# Patient Record
Sex: Male | Born: 1950 | Race: White | Hispanic: No | Marital: Married | Smoking: Former smoker
Health system: Southern US, Community
[De-identification: ages and names within clinical notes are randomized; demographics above are authoritative.]

## PROBLEM LIST (undated history)

## (undated) DIAGNOSIS — K828 Other specified diseases of gallbladder: Secondary | ICD-10-CM

## (undated) DIAGNOSIS — K222 Esophageal obstruction: Secondary | ICD-10-CM

## (undated) DIAGNOSIS — E7800 Pure hypercholesterolemia, unspecified: Secondary | ICD-10-CM

## (undated) DIAGNOSIS — N200 Calculus of kidney: Secondary | ICD-10-CM

## (undated) DIAGNOSIS — D126 Benign neoplasm of colon, unspecified: Secondary | ICD-10-CM

## (undated) DIAGNOSIS — I499 Cardiac arrhythmia, unspecified: Secondary | ICD-10-CM

## (undated) DIAGNOSIS — K649 Unspecified hemorrhoids: Secondary | ICD-10-CM

## (undated) DIAGNOSIS — I4891 Unspecified atrial fibrillation: Secondary | ICD-10-CM

## (undated) DIAGNOSIS — K449 Diaphragmatic hernia without obstruction or gangrene: Secondary | ICD-10-CM

## (undated) DIAGNOSIS — K8020 Calculus of gallbladder without cholecystitis without obstruction: Secondary | ICD-10-CM

## (undated) DIAGNOSIS — Z87442 Personal history of urinary calculi: Secondary | ICD-10-CM

## (undated) DIAGNOSIS — K219 Gastro-esophageal reflux disease without esophagitis: Secondary | ICD-10-CM

## (undated) DIAGNOSIS — I10 Essential (primary) hypertension: Secondary | ICD-10-CM

## (undated) DIAGNOSIS — Z860100 Personal history of colon polyps, unspecified: Secondary | ICD-10-CM

## (undated) DIAGNOSIS — M1990 Unspecified osteoarthritis, unspecified site: Secondary | ICD-10-CM

## (undated) DIAGNOSIS — M109 Gout, unspecified: Secondary | ICD-10-CM

## (undated) HISTORY — DX: Unspecified atrial fibrillation: I48.91

## (undated) HISTORY — DX: Other specified diseases of gallbladder: K82.8

## (undated) HISTORY — PX: COLONOSCOPY: SHX174

## (undated) HISTORY — DX: Unspecified hemorrhoids: K64.9

## (undated) HISTORY — PX: ESOPHAGEAL DILATION: SHX303

## (undated) HISTORY — DX: Benign neoplasm of colon, unspecified: D12.6

## (undated) HISTORY — DX: Personal history of colonic polyps: Z86.010

## (undated) HISTORY — DX: Esophageal obstruction: K22.2

## (undated) HISTORY — DX: Calculus of kidney: N20.0

## (undated) HISTORY — PX: POLYPECTOMY: SHX149

## (undated) HISTORY — PX: VASECTOMY: SHX75

## (undated) HISTORY — PX: INGUINAL HERNIA REPAIR: SUR1180

## (undated) HISTORY — DX: Personal history of colon polyps, unspecified: Z86.0100

---

## 1978-10-14 HISTORY — PX: CYSTOSCOPY WITH RETROGRADE PYELOGRAM, URETEROSCOPY AND STENT PLACEMENT: SHX5789

## 1978-10-14 HISTORY — PX: CYSTOSCOPY W/ URETEROSCOPY W/ LITHOTRIPSY: SUR380

## 1998-10-31 ENCOUNTER — Emergency Department (HOSPITAL_COMMUNITY): Admission: EM | Admit: 1998-10-31 | Discharge: 1998-10-31 | Payer: Self-pay | Admitting: Emergency Medicine

## 1998-10-31 ENCOUNTER — Encounter: Payer: Self-pay | Admitting: Emergency Medicine

## 2002-04-06 ENCOUNTER — Ambulatory Visit (HOSPITAL_BASED_OUTPATIENT_CLINIC_OR_DEPARTMENT_OTHER): Admission: RE | Admit: 2002-04-06 | Discharge: 2002-04-06 | Payer: Self-pay | Admitting: Urology

## 2003-04-26 ENCOUNTER — Emergency Department (HOSPITAL_COMMUNITY): Admission: AD | Admit: 2003-04-26 | Discharge: 2003-04-26 | Payer: Self-pay | Admitting: Family Medicine

## 2003-06-13 DIAGNOSIS — D126 Benign neoplasm of colon, unspecified: Secondary | ICD-10-CM

## 2003-06-13 HISTORY — DX: Benign neoplasm of colon, unspecified: D12.6

## 2004-02-17 ENCOUNTER — Ambulatory Visit (HOSPITAL_BASED_OUTPATIENT_CLINIC_OR_DEPARTMENT_OTHER): Admission: RE | Admit: 2004-02-17 | Discharge: 2004-02-17 | Payer: Self-pay | Admitting: Urology

## 2004-02-17 ENCOUNTER — Ambulatory Visit (HOSPITAL_COMMUNITY): Admission: RE | Admit: 2004-02-17 | Discharge: 2004-02-17 | Payer: Self-pay | Admitting: Urology

## 2004-11-08 ENCOUNTER — Ambulatory Visit (HOSPITAL_COMMUNITY): Admission: RE | Admit: 2004-11-08 | Discharge: 2004-11-08 | Payer: Self-pay | Admitting: Internal Medicine

## 2004-12-19 ENCOUNTER — Ambulatory Visit: Payer: Self-pay | Admitting: Gastroenterology

## 2004-12-27 ENCOUNTER — Ambulatory Visit: Payer: Self-pay | Admitting: Gastroenterology

## 2005-01-29 ENCOUNTER — Ambulatory Visit: Payer: Self-pay | Admitting: Gastroenterology

## 2008-05-07 ENCOUNTER — Encounter (INDEPENDENT_AMBULATORY_CARE_PROVIDER_SITE_OTHER): Payer: Self-pay | Admitting: *Deleted

## 2008-06-21 ENCOUNTER — Ambulatory Visit: Payer: Self-pay | Admitting: Gastroenterology

## 2008-07-05 ENCOUNTER — Ambulatory Visit: Payer: Self-pay | Admitting: Gastroenterology

## 2009-10-10 ENCOUNTER — Encounter: Admission: RE | Admit: 2009-10-10 | Discharge: 2009-10-10 | Payer: Self-pay | Admitting: General Surgery

## 2009-10-14 ENCOUNTER — Ambulatory Visit (HOSPITAL_BASED_OUTPATIENT_CLINIC_OR_DEPARTMENT_OTHER): Admission: RE | Admit: 2009-10-14 | Discharge: 2009-10-14 | Payer: Self-pay | Admitting: General Surgery

## 2010-04-27 LAB — CBC
HCT: 45.3 % (ref 39.0–52.0)
Hemoglobin: 15.6 g/dL (ref 13.0–17.0)
MCH: 31.4 pg (ref 26.0–34.0)
MCHC: 34.4 g/dL (ref 30.0–36.0)
MCV: 91.1 fL (ref 78.0–100.0)
Platelets: 222 10*3/uL (ref 150–400)
RBC: 4.97 MIL/uL (ref 4.22–5.81)
RDW: 13.7 % (ref 11.5–15.5)
WBC: 4.2 10*3/uL (ref 4.0–10.5)

## 2010-04-27 LAB — BASIC METABOLIC PANEL
BUN: 9 mg/dL (ref 6–23)
CO2: 29 mEq/L (ref 19–32)
Calcium: 9.5 mg/dL (ref 8.4–10.5)
Chloride: 104 mEq/L (ref 96–112)
Creatinine, Ser: 1.23 mg/dL (ref 0.4–1.5)
GFR calc Af Amer: >60 mL/min (ref >60)
GFR calc non Af Amer: >60 mL/min (ref >60)
Glucose, Bld: 116 mg/dL — ABNORMAL HIGH (ref 70–99)
Potassium: 4.3 mEq/L (ref 3.5–5.1)
Sodium: 139 mEq/L (ref 135–145)

## 2010-04-27 LAB — DIFFERENTIAL
Basophils Absolute: 0 10*3/uL (ref 0.0–0.1)
Basophils Relative: 1 % (ref 0–1)
Neutro Abs: 2.3 10*3/uL (ref 1.7–7.7)
Neutrophils Relative %: 54 % (ref 43–77)

## 2010-06-30 NOTE — Op Note (Signed)
NAMEAHAMED, HOFLAND NO.:  [id number]   MEDICAL RECORD NO.:  [id number]          PATIENT TYPE:  AMB   LOCATION:  [facility]                         FACILITY:  [facility]   PHYSICIAN:  Maretta Bees. Vonita Moss, M.D.DATE OF BIRTH:  Jun 28, 1950   DATE OF PROCEDURE:  02/17/2004  DATE OF DISCHARGE:                                 OPERATIVE REPORT   PREOPERATIVE DIAGNOSIS:  Distal left ureteral calculus with hydronephrosis.   POSTOPERATIVE DIAGNOSIS:  Distal left ureteral calculus with hydronephrosis.   PROCEDURE:  Cystoscopy, left ureteroscopy with laser fragmentation and  basketing of stone, and left retrograde pyelogram with interpretation.   SURGEON:  Maretta Bees. Vonita Moss, M.D.   ANESTHESIA:  General.   INDICATION:  This 60 year old gentleman has had left flank pain and has had  left hydronephrosis due to a 6-7 mm stone in the distal left ureter.  It was  not very visible on KUB and it may be because it is uric acid because he  does have gout.  In any case, the gentleman wanted to proceed with removal  rather than trying to pass this fairly large stone which appears to have  been there for some time.   PROCEDURE:  The patient was brought to the operating room and placed in the  lithotomy position.  External genitalia were prepped and draped in the usual  fashion.  He was cystoscoped and the anterior urethra, prostatic urethra,  and bladder were unremarkable.  I passed a guidewire up the left ureter  without difficulty.  The short rigid left ureteroscope easily negotiated the  ureteral orifice and I encountered an oval yellow stone in the distal ureter  that with irrigation tended to migrate up toward the kidney, so I inserted a  nitinol stone basket to secure it so that with laser fragmentation it would  not go back into the kidney.  With the stone engaged in a basket and located  in the lower ureter, I cut off the handle and put a snap on it and was able  to remove the stone  basket from the ureteroscope but leave it intact in the  ureter so that I could reinsert the ureteroscope with the guidewire in,  stone basket in place, and then through the ureteroscope insert the holmium  laser fiber and fragment the stone into five or six pieces, all of which  were later retrieved with the stone basket.  A retrograde pyelogram showed  hydronephrosis, but I re-scoped the ureter and there were no residual stone  fragments.  There was essentially no trauma to the ureter.  Since this was  an essentially atraumatic procedure, I elected not to leave in a double-J so  the ureteroscope was removed and he was re-cystoscoped and the stone  fragments that I had pulled into the bladder were retrieved and given to the  patient.  The bladder emptied, the cystoscope removed, and the patient was  sent to the recovery room in good condition having tolerated the procedure  well.      LJP/MEDQ  D:  02/17/2004  T:  02/17/2004  Job:  [id number] 

## 2010-06-30 NOTE — Op Note (Signed)
NAME:  James Santiago, James Santiago                        ACCOUNT NO.:  [id number]   MEDICAL RECORD NO.:  [id number]                   PATIENT TYPE:  AMB   LOCATION:  [facility]                                 FACILITY:  [facility]   PHYSICIAN:  Maretta Bees. Vonita Moss, M.D.             DATE OF BIRTH:  [birth year]   DATE OF PROCEDURE:  04/06/2002  DATE OF DISCHARGE:                                 OPERATIVE REPORT   PREOPERATIVE DIAGNOSES:  1. Left ureteral calculus.  2. Gouty arthritis.   POSTOPERATIVE DIAGNOSES:  1. Left ureteral calculus.  2. Gouty arthritis.   PROCEDURES:  1. Cystoscopy.  2. Left ureteroscopy and holmium laser fragmentation and stone basketing.  3. Left retrograde pyelogram with interpretation.  4. Left double J catheter insertion.   SURGEON:  Maretta Bees. Vonita Moss, M.D.   ANESTHESIA:  General.   INDICATIONS:  This 60 year old gentleman has had several days of severe left  flank pain and a CT scan showed a 5 x 6 mm stone in the distal left ureter  with hydronephrosis.  He was brought to the OR today because of persistent  symptoms and the size of the stone.  In the holding area he was found to  have onset yesterday of tenderness in the right ankle and left toes, and it  hurts to walk.  He has had a previous suspected diagnosis of gouty  arthritis.  On examination, the right ankle is tender to palpation and  therapy was initiated with colchicine 1.2 mg postop and then 0.6 mg q.2h. up  to a total of six more tablets until he gets relief of his arthritis or he  develops GI symptoms, and he was told to have follow-up with Dr. Milus Glazier.   DESCRIPTION OF PROCEDURE:  The patient was brought to the operating room and  placed in lithotomy position.  External genitalia prepped and draped in the  usual fashion.  He was cystoscoped, and the bladder was unremarkable.  He  had partial prostatic obstruction.  A metal guidewire was placed up the  distal left ureter and about 2 cm up, there was  slight hand-up before the  guidewire slipped on by, and then performed a left retrograde pyelogram by  putting in an open-ended 5 ureteral catheter above the suspected site of  obstruction.  He had significant hydronephrosis above that.  With the  guidewire in place, I then inserted the 6 French short ureteroscope and  encountered a smooth, golden stone consistent with uric acid in the distal  ureter. The stone was slightly too big to remove intact, so I inserted the  laser fiber and using the holmium laser, I fragmented the stone into three  or four pieces and some tiny fragments, which were then removed with the  nitinol stone basket, and then I ureteroscoped him and there were no  residual stones, and I performed a retrograde pyelogram that showed no  extravasation and  a dilated pyelocalyceal system.  With the guidewire back  in place, I backloaded the cystoscope and inserted a 6  [place], 26 cm double J catheter with a full coil in the renal pelvis and a  full coil in the bladder and the strong brought out through the urethra.  The string was then taped to the penis, and he was taken to the recovery  room in good condition.                                                  Maretta Bees. Vonita Moss, M.D.    LJP/MEDQ  D:  04/06/2002  T:  04/06/2002  Job:  [id number]   cc:   Elvina Sidle, M.D.  [address] [place]  [place] [address]  Fax: [phone]

## 2012-02-01 ENCOUNTER — Emergency Department (HOSPITAL_COMMUNITY): Payer: 59

## 2012-02-01 ENCOUNTER — Encounter (HOSPITAL_COMMUNITY): Payer: Self-pay | Admitting: *Deleted

## 2012-02-01 ENCOUNTER — Observation Stay (HOSPITAL_COMMUNITY)
Admission: EM | Admit: 2012-02-01 | Discharge: 2012-02-03 | Disposition: A | Discharge status: Home / Self Care | Payer: 59 | Attending: Cardiology | Admitting: Cardiology

## 2012-02-01 DIAGNOSIS — K449 Diaphragmatic hernia without obstruction or gangrene: Secondary | ICD-10-CM

## 2012-02-01 DIAGNOSIS — R1013 Epigastric pain: Secondary | ICD-10-CM

## 2012-02-01 DIAGNOSIS — Z720 Tobacco use: Secondary | ICD-10-CM

## 2012-02-01 DIAGNOSIS — F17200 Nicotine dependence, unspecified, uncomplicated: Secondary | ICD-10-CM | POA: Insufficient documentation

## 2012-02-01 DIAGNOSIS — R112 Nausea with vomiting, unspecified: Secondary | ICD-10-CM

## 2012-02-01 DIAGNOSIS — R079 Chest pain, unspecified: Secondary | ICD-10-CM

## 2012-02-01 DIAGNOSIS — Z7902 Long term (current) use of antithrombotics/antiplatelets: Secondary | ICD-10-CM | POA: Insufficient documentation

## 2012-02-01 DIAGNOSIS — Z7982 Long term (current) use of aspirin: Secondary | ICD-10-CM | POA: Insufficient documentation

## 2012-02-01 DIAGNOSIS — N179 Acute kidney failure, unspecified: Secondary | ICD-10-CM

## 2012-02-01 DIAGNOSIS — K219 Gastro-esophageal reflux disease without esophagitis: Secondary | ICD-10-CM | POA: Insufficient documentation

## 2012-02-01 DIAGNOSIS — I4891 Unspecified atrial fibrillation: Principal | ICD-10-CM

## 2012-02-01 DIAGNOSIS — Z79899 Other long term (current) drug therapy: Secondary | ICD-10-CM | POA: Insufficient documentation

## 2012-02-01 HISTORY — DX: Calculus of kidney: N20.0

## 2012-02-01 HISTORY — DX: Gout, unspecified: M10.9

## 2012-02-01 HISTORY — DX: Unspecified osteoarthritis, unspecified site: M19.90

## 2012-02-01 HISTORY — DX: Diaphragmatic hernia without obstruction or gangrene: K44.9

## 2012-02-01 HISTORY — DX: Pure hypercholesterolemia, unspecified: E78.00

## 2012-02-01 HISTORY — DX: Unspecified atrial fibrillation: I48.91

## 2012-02-01 HISTORY — DX: Calculus of gallbladder without cholecystitis without obstruction: K80.20

## 2012-02-01 HISTORY — DX: Essential (primary) hypertension: I10

## 2012-02-01 HISTORY — DX: Gastro-esophageal reflux disease without esophagitis: K21.9

## 2012-02-01 LAB — CBC WITH DIFFERENTIAL/PLATELET
Basophils Absolute: 0 10*3/uL (ref 0.0–0.1)
Basophils Relative: 1 % (ref 0–1)
Eosinophils Absolute: 0.1 10*3/uL (ref 0.0–0.7)
Eosinophils Relative: 2 % (ref 0–5)
HCT: 44.3 % (ref 39.0–52.0)
MCHC: 31.8 g/dL (ref 30.0–36.0)
Monocytes Absolute: 0.3 10*3/uL (ref 0.1–1.0)
Neutro Abs: 2.4 10*3/uL (ref 1.7–7.7)
RDW: 13.2 % (ref 11.5–15.5)

## 2012-02-01 LAB — POCT I-STAT, CHEM 8
HCT: 47 % (ref 39.0–52.0)
Hemoglobin: 16 g/dL (ref 13.0–17.0)
Sodium: 141 mEq/L (ref 135–145)
TCO2: 25 mmol/L (ref 0–100)

## 2012-02-01 LAB — POCT I-STAT TROPONIN I: Troponin i, poc: 0.01 ng/mL (ref 0.00–0.08)

## 2012-02-01 LAB — PROTIME-INR: INR: 1.06 (ref 0.00–1.49)

## 2012-02-01 LAB — MAGNESIUM: Magnesium: 1.8 mg/dL (ref 1.5–2.5)

## 2012-02-01 LAB — APTT: aPTT: 31 seconds (ref 24–37)

## 2012-02-01 MED ORDER — SODIUM CHLORIDE 0.9 % IV SOLN
INTRAVENOUS | Status: DC
  Administered 2012-02-01: 20:00:00 via INTRAVENOUS

## 2012-02-01 MED ORDER — DOXAZOSIN MESYLATE 4 MG PO TABS
4.0000 mg | ORAL_TABLET | Freq: Every day | ORAL | Status: DC
  Administered 2012-02-02 – 2012-02-03 (×2): 4 mg via ORAL
  Filled 2012-02-01 (×3): qty 1

## 2012-02-01 MED ORDER — METOPROLOL TARTRATE 1 MG/ML IV SOLN
5.0000 mg | Freq: Once | INTRAVENOUS | Status: AC
  Administered 2012-02-01: 5 mg via INTRAVENOUS
  Filled 2012-02-01: qty 5

## 2012-02-01 MED ORDER — HEPARIN (PORCINE) IN NACL 100-0.45 UNIT/ML-% IJ SOLN
1400.0000 [IU]/h | INTRAMUSCULAR | Status: DC
  Administered 2012-02-01: 1600 [IU]/h via INTRAVENOUS
  Administered 2012-02-02: 1400 [IU]/h via INTRAVENOUS
  Filled 2012-02-01 (×2): qty 250

## 2012-02-01 MED ORDER — ALLOPURINOL 300 MG PO TABS
300.0000 mg | ORAL_TABLET | Freq: Every day | ORAL | Status: DC
  Administered 2012-02-02 – 2012-02-03 (×2): 300 mg via ORAL
  Filled 2012-02-01 (×3): qty 1

## 2012-02-01 MED ORDER — AMLODIPINE BESYLATE 10 MG PO TABS
10.0000 mg | ORAL_TABLET | Freq: Every day | ORAL | Status: DC
  Administered 2012-02-02 – 2012-02-03 (×2): 10 mg via ORAL
  Filled 2012-02-01 (×3): qty 1

## 2012-02-01 MED ORDER — PANTOPRAZOLE SODIUM 40 MG PO TBEC
40.0000 mg | DELAYED_RELEASE_TABLET | Freq: Every day | ORAL | Status: DC
  Administered 2012-02-01 – 2012-02-03 (×3): 40 mg via ORAL
  Filled 2012-02-01: qty 1

## 2012-02-01 MED ORDER — METOPROLOL TARTRATE 12.5 MG HALF TABLET
12.5000 mg | ORAL_TABLET | Freq: Four times a day (QID) | ORAL | Status: DC
  Administered 2012-02-01 – 2012-02-03 (×6): 12.5 mg via ORAL
  Filled 2012-02-01 (×10): qty 1

## 2012-02-01 MED ORDER — OFF THE BEAT BOOK
Freq: Once | Status: AC
  Administered 2012-02-02: 08:00:00
  Filled 2012-02-01: qty 1

## 2012-02-01 MED ORDER — GI COCKTAIL ~~LOC~~: 30.0000 mL | Freq: Once | ORAL | Status: DC

## 2012-02-01 MED ORDER — NITROGLYCERIN 0.4 MG SL SUBL: 0.4000 mg | SUBLINGUAL_TABLET | SUBLINGUAL | Status: DC | PRN

## 2012-02-01 MED ORDER — ASPIRIN EC 81 MG PO TBEC
81.0000 mg | DELAYED_RELEASE_TABLET | Freq: Every day | ORAL | Status: DC
  Administered 2012-02-02 – 2012-02-03 (×2): 81 mg via ORAL
  Filled 2012-02-01 (×3): qty 1

## 2012-02-01 MED ORDER — ACETAMINOPHEN 325 MG PO TABS: 650.0000 mg | ORAL_TABLET | ORAL | Status: DC | PRN

## 2012-02-01 MED ORDER — SIMVASTATIN 10 MG PO TABS
10.0000 mg | ORAL_TABLET | Freq: Every day | ORAL | Status: DC
  Administered 2012-02-01 – 2012-02-02 (×2): 10 mg via ORAL
  Filled 2012-02-01 (×3): qty 1

## 2012-02-01 MED ORDER — HEPARIN BOLUS VIA INFUSION
4000.0000 [IU] | Freq: Once | INTRAVENOUS | Status: AC
  Administered 2012-02-01: 4000 [IU] via INTRAVENOUS
  Filled 2012-02-01: qty 4000

## 2012-02-01 MED ORDER — ONDANSETRON HCL 4 MG/2ML IJ SOLN: 4.0000 mg | Freq: Four times a day (QID) | INTRAMUSCULAR | Status: DC | PRN

## 2012-02-01 MED ORDER — METOCLOPRAMIDE HCL 5 MG PO TABS
5.0000 mg | ORAL_TABLET | Freq: Three times a day (TID) | ORAL | Status: DC
  Administered 2012-02-02 – 2012-02-03 (×2): 5 mg via ORAL
  Filled 2012-02-01 (×11): qty 1

## 2012-02-01 MED ORDER — ALPRAZOLAM 0.25 MG PO TABS: 0.2500 mg | ORAL_TABLET | Freq: Two times a day (BID) | ORAL | Status: DC | PRN

## 2012-02-01 MED ORDER — ZOLPIDEM TARTRATE 5 MG PO TABS: 5.0000 mg | ORAL_TABLET | Freq: Every evening | ORAL | Status: DC | PRN

## 2012-02-01 NOTE — Progress Notes (Signed)
ANTICOAGULATION CONSULT NOTE - Initial Consult  Pharmacy Consult for Heparin Indication: atrial fibrillation  No Known Allergies  Patient Measurements: Height: 6\' 4"  (193 cm) Weight: 248 lb (112.492 kg) IBW/kg (Calculated) : 86.8  Heparin Dosing Weight: 108kg  Vital Signs: Temp: 98.8 F (37.1 C) (12/20 1819) Temp src: Oral (12/20 1819) BP: 107/88 mmHg (12/20 1819) Pulse Rate: 101  (12/20 1819)  Labs:  Basename 02/01/12 1612  HGB 16.0  HCT 47.0  PLT --  APTT --  LABPROT --  INR --  HEPARINUNFRC --  CREATININE 1.50*  CKTOTAL --  CKMB --  TROPONINI --    CrCl is unknown because there is no height on file for the current visit.   Medical History: Past Medical History  Diagnosis Date  . Hypertension   . GERD (gastroesophageal reflux disease)   . Gout   . A-fib     a. Dx 01/2012, CHADS2 = 1.  . Hiatal hernia   . Nephrolithiasis   . Cholelithiasis   . Right inguinal hernia     a. 10/2009 s/p repair.    Medications:  Prescriptions prior to admission  Medication Sig Dispense Refill  . allopurinol (ZYLOPRIM) 300 MG tablet Take 300 mg by mouth daily.      Marland Kitchen amLODipine (NORVASC) 10 MG tablet Take 10 mg by mouth daily.      Marland Kitchen aspirin EC 81 MG tablet Take 81 mg by mouth daily.      Marland Kitchen doxazosin (CARDURA) 4 MG tablet Take 4 mg by mouth daily.      Marland Kitchen omeprazole (PRILOSEC) 20 MG capsule Take 20 mg by mouth daily.      . simvastatin (ZOCOR) 10 MG tablet Take 10 mg by mouth at bedtime.      Marland Kitchen telmisartan (MICARDIS) 80 MG tablet Take 80 mg by mouth daily.        Assessment: 61 yo F admitted 02/01/2012 with afib and RVR.  Pharmacy consulted to dose heparin.  Goal of Therapy:   Heparin level 0.3-0.7 units/ml Monitor platelets by anticoagulation protocol: Yes   Plan:  Give 4000 units bolus x 1 Initiate heparin 1600 units/hr infusion Check heparin level 6h after ggt starts Daily heparin level cbc  Thank you for allowing pharmacy to be a part of this patients care  team.  Lovenia Kim Pharm.D., BCPS Clinical Pharmacist 02/01/2012 7:13 PM Pager: [phone] Phone: [phone]

## 2012-02-01 NOTE — ED Notes (Signed)
Per EMS- pt was seen today at his PCP for burning chest pain that was intermittent for the last week. Pt was found to be in a fib. HR 110-150. Asymptomatic. pt received asprin 324 and 1 nitro at PCP with relief of burning. Pt states on arrival to room that burning has returned.

## 2012-02-01 NOTE — H&P (Signed)
Patient ID: James Santiago MRN: [id number], DOB/AGE: [birth year]   Admit date: 02/01/2012   Primary Physician: No primary provider on file. Primary Cardiologist: new  Pt. Profile:  61 y/o male w/o prior cardiac hx who presented to ED today after being found if afib by pcp after 1 week of intermittent epigastric pain.  Problem List  Past Medical History  Diagnosis Date  . Hypertension   . GERD (gastroesophageal reflux disease)   . Gout   . A-fib     a. Dx 01/2012, CHADS2 = 1.  . Hiatal hernia   . Nephrolithiasis   . Cholelithiasis   . Right inguinal hernia     a. 10/2009 s/p repair.    Past Surgical History  Procedure Date  . Hernia repair     Allergies  No Known Allergies  HPI  61 y/o male without prior cardiac hx.  Over the past week, he has been experiencing post-prandial epigastric and chest discomfort w/o associated Ss.  He has been making himself vomit after each meal with resolution of Ss.  Today, chest and epigastric pain was present upon awakening.  He skipped breakfast and saw his PCP @ 11:30 AM and was found to be in afib with rvr.  He was sent to ED for evaluation.  He denies any recent DOE, palpitations, pnd, orthopnea, presyncope, syncope.  He was treated with iv metoprolol in ED and HR's are currently in the 90's to low 100's.  He is comfortable and hemodynamically stable.  Home Medications  Prior to Admission medications   Medication Sig Start Date End Date Taking? Authorizing Provider  allopurinol (ZYLOPRIM) 300 MG tablet Take 300 mg by mouth daily.   Yes Historical Provider, MD  amLODipine (NORVASC) 10 MG tablet Take 10 mg by mouth daily.   Yes Historical Provider, MD  aspirin EC 81 MG tablet Take 81 mg by mouth daily.   Yes Historical Provider, MD  doxazosin (CARDURA) 4 MG tablet Take 4 mg by mouth daily.   Yes Historical Provider, MD  omeprazole (PRILOSEC) 20 MG capsule Take 20 mg by mouth daily.   Yes Historical Provider, MD  simvastatin (ZOCOR)  10 MG tablet Take 10 mg by mouth at bedtime.   Yes Historical Provider, MD  telmisartan (MICARDIS) 80 MG tablet Take 80 mg by mouth daily.   Yes Historical Provider, MD    Family History  Family History  Problem Relation Age of Onset  . Lung cancer Father     died @ 22.  Also had PPM  . Kidney disease Mother     died @ 27. Also had h/o CVA, breat cancer, diabetes, and PPM    Social History  History   Social History  . Marital Status: Married    Spouse Name: N/A    Number of Children: N/A  . Years of Education: N/A   Occupational History  . Not on file.   Social History Main Topics  . Smoking status: Former Games developer  . Smokeless tobacco: Current User    Types: Chew     Comment: smoked cigarettes "many, many yrs ago."  Currently uses 1/2 pouch of chewing tobacco daily.  . Alcohol Use: No  . Drug Use: No  . Sexually Active: Not on file   Other Topics Concern  . Not on file   Social History Narrative   Lives in [place] with wife.  They have one grown child and a grandson.  He works for the city of [employer],  doing maintenance in parks/ball fields.     Review of Systems General:  No chills, fever, night sweats or weight changes.  Cardiovascular:  +++ epigastric/chest burning w/o dyspnea on exertion, edema, orthopnea, palpitations, paroxysmal nocturnal dyspnea. Dermatological: No rash, lesions/masses Respiratory: No cough, dyspnea Urologic: No hematuria, dysuria Abdominal:   +++ nausea, vomiting, and epigastric pain following meals.  No diarrhea, bright red blood per rectum, melena, or hematemesis Neurologic:  No visual changes, wkns, changes in mental status. All other systems reviewed and are otherwise negative except as noted above.  Physical Exam  Blood pressure 117/83, pulse 75, resp. rate 20, SpO2 98.00%.  General: Pleasant, NAD Psych: Normal affect. Neuro: Alert and oriented X 3. Moves all extremities spontaneously. HEENT: Normal  Neck: Supple without bruits or  JVD. Lungs:  Resp regular and unlabored, CTA. Heart: IR, IR, tachy.  No s3, s4, or murmurs. Abdomen: Soft, non-tender, non-distended, BS + x 4.  Extremities: No clubbing, cyanosis or edema. DP/PT/Radials 2+ and equal bilaterally.  Labs  Troponin i 0.01.  Lab Results  Component Value Date   WBC 4.2 10/10/2009   HGB 16.0 02/01/2012   HCT 47.0 02/01/2012   MCV 91.1 10/10/2009   PLT 222 10/10/2009     Lab 02/01/12 1612  NA 141  K 3.8  CL 107  CO2 --  BUN 15  CREATININE 1.50*  CALCIUM --  PROT --  BILITOT --  ALKPHOS --  ALT --  AST --  GLUCOSE 91    Radiology/Studies  Dg Chest 2 View  02/01/2012  *RADIOLOGY REPORT*  Clinical Data: Chest tightness, atrial fibrillation.  CHEST - 2 VIEW  Comparison: 10/10/2009.  Findings: Trachea is midline.  Heart size stable.  Large hiatal hernia.  Linear scarring at the left costophrenic angle.  Lungs are otherwise clear.  No pleural fluid.  IMPRESSION: No acute findings.  Large hiatal hernia.   Original Report Authenticated By: Leanna Battles, M.D.    ECG  Afib, 132, no acute st/t changes.  ASSESSMENT AND PLAN  1.  Afib RVR:  Pt presents with a 1 week h/o intermittent post-prandial epigastric pain relieved by forced vomiting with similar discomfort all day today.  He was found to be in afib by PCP.  He denies palpitations, presyncope, syncope, or dyspnea.  He continues to have 3/10 chest/epigastric discomfort with a nl troponin.  He has received 5mg  of IV lopressor here in the ED and HR has come down nicely to the 90's to low 100's.  Will admit, cycle CE, add heparin and oral beta blocker.  If he does not convert overnight but we can achieve adequate rate control, we will consider switching him to a NOAT for three weeks and then perform cardioversion.  If he does convert over night on bb therapy and echo nl, would plan to d/c on bb and asa (CHADS2 = 1) with outpt monitoring to determine burden of asymptomatic afib.  If he does not convert,  cannot be rate controlled, or becomes more symptomatic ->TEE/DCCV this admission.   2.  Chest/Epigastric pain:  Pt has had intermittent post-prandial discomfort for 1 week that had been relieved by vomiting but today this discomfort was more constant.  Despite it, his initial troponin is nl and ecg is w/o acute st/t changes.  Not clear that afib is driving Ss.  He has a h/o hiatal hernia and this was noted to be large on cxr.  As above, cycle CE.  Cont PPI.  Will provide GI  cocktail in the ED to see if that helps and try reglan.  If no objective evidence of ischemia but discomfort persists, he would benefit from inpatient GI evaluation.  3.  HTN:  Stable.  Hold micardis in setting of elevated creatinine and addition of bb.  Follow.  4.  Acute renal failure:  Creat 1.5.  No documented prior h/o CKD though he does have a h/o nephrolithiasis w/o recent pain/stones.  Hydrate in setting of frequent vomiting this past week and hold ARB.  5.  Tobacco Abuse:  Uses chewing tobacco.  Cessation advised.  Signed, Nicolasa Ducking, NP 02/01/2012, 4:53 PM   History and all data above reviewed.  Patient examined.  I agree with the findings as above.  He has not felt any palpitations. He has had no presyncope or syncope. He has no prior cardiac history. He was incidentally found to have atrial fibrillation when he presented with the above complaints of nausea and fullness that was improved after self induced vomiting. The symptoms are different than those he had with previous esophageal stricture. He does have a known hiatal hernia which is large. He otherwise is very active and denies any chest pain, presyncope or syncope or shortness of breath. The patient exam reveals [id number], no murmur  ,  Lungs: Clear  ,  Abd: Positive bowel sounds, no rebound no guarding, Ext No edema  .  All available labs, radiology testing, previous records reviewed. Agree with documented assessment and plan. The patient will be  observed overnight on heparin. If he does not convert to sinus rhythm I would suggest low dose beta blocker and treatment with Xarelto and I would plan to bring him back for elective cardioversion. I think itunlikely that his current symptoms  are related to the fibrillation.  However, I could not be clear on this until we have treated his fibrillation. Of note we will need to repeat his creatinine. I suspect it is slightly elevated because of his recent GI problems. We we'll need to determine his GFR prior to initiating normal anticoagulant.  Fayrene Fearing Leanette Eutsler  5:24 PM  02/01/2012

## 2012-02-01 NOTE — ED Provider Notes (Signed)
History     CSN: [id number]  Arrival date & time 02/01/12  1253   First MD Initiated Contact with Patient 02/01/12 1422      Chief Complaint  Patient presents with  . Atrial Fibrillation    (Consider location/radiation/quality/duration/timing/severity/associated sxs/prior treatment) The history is provided by the patient and the EMS personnel.   61yo M with PMH HTN, HLD, GERD, and a hiatal hernia presents to the ED with new onset atrial fibrillation. He has been experiencing 1 week of midsternal burning that he initially associated with is reflux, but today he began having midsternal pressure, so he decided to go to his PCP. He was significantly tachycardic in the 110-150s, per pt, so he was sent to the ED. He was given ASA 324mg  and Nitro x1 by EMS, which did improve his burning sensation, prior to arrival to the ED.   Past Medical History  Diagnosis Date  . Hypertension   . GERD (gastroesophageal reflux disease)   . Gout   . A-fib     a. Dx 01/2012, CHADS2 = 1.  . Hiatal hernia     a. Noted on CXR 01/2012    Past Surgical History  Procedure Date  . Hernia repair     No family history on file.  History  Substance Use Topics  . Smoking status: Not on file  . Smokeless tobacco: Current User    Types: Chew  . Alcohol Use: No      Review of Systems  All other systems reviewed and are negative.    Allergies  Review of patient's allergies indicates no known allergies.  Home Medications   Current Outpatient Rx  Name  Route  Sig  Dispense  Refill  . ALLOPURINOL 300 MG PO TABS   Oral   Take 300 mg by mouth daily.         Marland Kitchen AMLODIPINE BESYLATE 10 MG PO TABS   Oral   Take 10 mg by mouth daily.         . ASPIRIN EC 81 MG PO TBEC   Oral   Take 81 mg by mouth daily.         Marland Kitchen DOXAZOSIN MESYLATE 4 MG PO TABS   Oral   Take 4 mg by mouth daily.         Marland Kitchen OMEPRAZOLE 20 MG PO CPDR   Oral   Take 20 mg by mouth daily.         Marland Kitchen SIMVASTATIN 10 MG  PO TABS   Oral   Take 10 mg by mouth at bedtime.         . TELMISARTAN 80 MG PO TABS   Oral   Take 80 mg by mouth daily.           BP 117/83  Pulse 75  Resp 20  SpO2 98%  Physical Exam  Constitutional: He is oriented to person, place, and time. He appears well-developed and well-nourished.  HENT:  Head: Normocephalic and atraumatic.  Eyes: EOM are normal. Pupils are equal, round, and reactive to light.  Cardiovascular:       Irregularly irregular rate and rhythm  Pulmonary/Chest: Effort normal and breath sounds normal. No respiratory distress. He has no wheezes.  Abdominal: Soft. Bowel sounds are normal. He exhibits no distension. There is no tenderness.  Musculoskeletal: Normal range of motion. He exhibits no edema.  Neurological: He is alert and oriented to person, place, and time. No cranial nerve deficit.  Skin: Skin  is warm.  Psychiatric: He has a normal mood and affect. His behavior is normal.    ED Course  Procedures (including critical care time)  Labs Reviewed  POCT I-STAT, CHEM 8 - Abnormal; Notable for the following:    Creatinine, Ser 1.50 (*)     All other components within normal limits  POCT I-STAT TROPONIN I   Dg Chest 2 View  02/01/2012  *RADIOLOGY REPORT*  Clinical Data: Chest tightness, atrial fibrillation.  CHEST - 2 VIEW  Comparison: 10/10/2009.  Findings: Trachea is midline.  Heart size stable.  Large hiatal hernia.  Linear scarring at the left costophrenic angle.  Lungs are otherwise clear.  No pleural fluid.  IMPRESSION: No acute findings.  Large hiatal hernia.   Original Report Authenticated By: Leanna Battles, M.D.      Date: 02/01/2012  Rate: 132   Rhythm: atrial fibrillation  QRS Axis: right  Intervals: QT prolonged  ST/T Wave abnormalities: normal  Conduction Disutrbances:none  Narrative Interpretation:   Old EKG Reviewed: changes noted   1. Atrial fibrillation      5mg  IV Metoprolol given; HR improved somewhat, now 90s to  low 100s.  4:15pm Cardiology consulted and will come and evaluate the patient, and will determine if they will admit the pt or think he should be admitted to the hospitalist service.  MDM           Genelle Gather, MD 02/01/12 [phone]

## 2012-02-02 DIAGNOSIS — I4891 Unspecified atrial fibrillation: Secondary | ICD-10-CM

## 2012-02-02 LAB — HEMOGLOBIN A1C
Hgb A1c MFr Bld: 5.9 % — ABNORMAL HIGH (ref <5.7)
Mean Plasma Glucose: 123 mg/dL — ABNORMAL HIGH (ref <117)

## 2012-02-02 LAB — LIPID PANEL
Cholesterol: 130 mg/dL (ref 0–200)
Triglycerides: 52 mg/dL (ref <150)

## 2012-02-02 LAB — COMPREHENSIVE METABOLIC PANEL
Albumin: 3.7 g/dL (ref 3.5–5.2)
BUN: 14 mg/dL (ref 6–23)
Calcium: 9.4 mg/dL (ref 8.4–10.5)
Creatinine, Ser: 1.65 mg/dL — ABNORMAL HIGH (ref 0.50–1.35)
Potassium: 3.6 mEq/L (ref 3.5–5.1)
Total Protein: 6.2 g/dL (ref 6.0–8.3)

## 2012-02-02 LAB — CBC
HCT: 44.5 % (ref 39.0–52.0)
Hemoglobin: 14.7 g/dL (ref 13.0–17.0)
MCH: 29.6 pg (ref 26.0–34.0)
MCHC: 33 g/dL (ref 30.0–36.0)

## 2012-02-02 LAB — TROPONIN I
Troponin I: <0.3 ng/mL (ref <0.30)
Troponin I: <0.3 ng/mL (ref <0.30)

## 2012-02-02 LAB — HEPARIN LEVEL (UNFRACTIONATED): Heparin Unfractionated: 1.12 [IU]/mL — ABNORMAL HIGH (ref 0.30–0.70)

## 2012-02-02 MED ORDER — HEPARIN (PORCINE) IN NACL 100-0.45 UNIT/ML-% IJ SOLN
1200.0000 [IU]/h | INTRAMUSCULAR | Status: DC
Start: 2012-02-02 — End: 2012-02-02

## 2012-02-02 MED ORDER — HEPARIN (PORCINE) IN NACL 100-0.45 UNIT/ML-% IJ SOLN
1200.0000 [IU]/h | INTRAMUSCULAR | Status: DC
  Filled 2012-02-02: qty 250

## 2012-02-02 MED ORDER — RIVAROXABAN 15 MG PO TABS
15.0000 mg | ORAL_TABLET | Freq: Every day | ORAL | Status: DC
  Filled 2012-02-02: qty 1

## 2012-02-02 MED ORDER — RIVAROXABAN 20 MG PO TABS
20.0000 mg | ORAL_TABLET | Freq: Every day | ORAL | Status: DC
  Administered 2012-02-02: 20 mg via ORAL
  Filled 2012-02-02 (×2): qty 1

## 2012-02-02 NOTE — Progress Notes (Addendum)
Nutrition Brief Note  Patient identified on the Malnutrition Screening Tool (MST) Report  Body mass index is 30.19 kg/(m^2). Pt meets criteria for class I obesity based on current BMI.   Current diet order is heart healthy, patient is consuming approximately 50-75% of meals at this time. Labs and medications reviewed. Pt admitted with 1 week history of epigastric pain with pt vomiting after meals to relieve the nausea/fullness he was feeling. Pt reported 10 pound unintended weight loss in the past 6-8 months. Met with pt who reports the nausea, vomiting, and abdominal pain have resolved and pt eating well. Pt stated he does not need any nutritional supplements. Pt denies any nutrition educational needs. Pt with history of esophageal stricture but denies any dysphagia.   No nutrition interventions warranted at this time. If nutrition issues arise, please consult RD.   Levon Hedger MS, RD, LDN [phone] Weekend/After Hours Pager

## 2012-02-02 NOTE — Progress Notes (Signed)
ANTICOAGULATION CONSULT NOTE - Follow Up Consult  Pharmacy Consult for Heparin Indication: atrial fibrillation  No Known Allergies  Patient Measurements: Height: 6\' 4"  (193 cm) Weight: 248 lb (112.492 kg) IBW/kg (Calculated) : 86.8  Heparin Dosing Weight: 108kg  Vital Signs: Temp: 98.3 F (36.8 C) (12/21 0500) Temp src: Oral (12/21 0500) BP: 105/78 mmHg (12/21 0500) Pulse Rate: 84  (12/21 0500)  Labs:  Basename 02/02/12 0905 02/02/12 0109 02/01/12 1930 02/01/12 1929 02/01/12 1612  HGB -- 14.7 -- 14.1 --  HCT -- 44.5 -- 44.3 47.0  PLT -- 179 -- 163 --  APTT -- -- -- 31 --  LABPROT -- -- -- 13.7 --  INR -- -- -- 1.06 --  HEPARINUNFRC 1.12* 1.03* -- -- --  CREATININE -- 1.65* -- -- 1.50*  CKTOTAL -- -- -- -- --  CKMB -- -- -- -- --  TROPONINI <0.30 <0.30 <0.30 -- --    Estimated Creatinine Clearance: 65.4 ml/min (by C-G formula based on Cr of 1.65).   Medications:  Scheduled:    . allopurinol  300 mg Oral Daily  . amLODipine  10 mg Oral Daily  . aspirin EC  81 mg Oral Daily  . doxazosin  4 mg Oral Daily  . gi cocktail  30 mL Oral Once  . [COMPLETED] heparin  4,000 Units Intravenous Once  . metoCLOPramide  5 mg Oral TID AC & HS  . [COMPLETED] metoprolol  5 mg Intravenous Once  . metoprolol tartrate  12.5 mg Oral Q6H  . [COMPLETED] off the beat book   Does not apply Once  . pantoprazole  40 mg Oral Daily  . simvastatin  10 mg Oral QHS    Assessment: 61 y/o M admitted 12/20 with Afib/RVR. Pharmacy is consulted to dose heparin. No AC PTA except ASA 81mg /day. First HL drawn EARLY and was 1.03 which may have reflected some bolus drug. Rate was decreased to 1400 units/hr and F/U heparin level of 1.12 was drawn EARLY AS WELL (5 hours). Scr up at 1.65 (from 1.50); unsure of baseline (1.2?). No evidence of bleeding noted.  Goal of Therapy:  Heparin level 0.3-0.7 units/ml Monitor platelets by anticoagulation protocol: Yes   Plan:  - Hold heparin infusion for one hour  (spoke to RN) - Re-start heparin infusion at 1200 units/hr at 1200 - Check 8 hour heparin level given bump  - Daily CBC/HL - Monitor closely for bleeding  Abran Duke, PharmD Clinical Pharmacist Phone: [phone] Pager: [phone] 02/02/2012 11:06 AM

## 2012-02-02 NOTE — Progress Notes (Signed)
Patient ID: James Santiago, male   DOB: 02-02-1951, 61 y.o.   MRN: [id number]    Subjective:  Denies SSCP, palpitations or Dyspnea Abdominal pain improved had breakfast  Objective:  Filed Vitals:   02/01/12 1819 02/01/12 1900 02/01/12 2056 02/02/12 0500  BP: 107/88 115/80 121/85 105/78  Pulse: 101 96 88 84  Temp: 98.8 F (37.1 C) 98.2 F (36.8 C) 97.5 F (36.4 C) 98.3 F (36.8 C)  TempSrc: Oral   Oral  Resp: 13 17 16    Height:  6\' 4"  (1.93 m)    Weight:  248 lb (112.492 kg)  248 lb (112.492 kg)  SpO2: 95% 96% 97% 96%    Intake/Output from previous day:  Intake/Output Summary (Last 24 hours) at 02/02/12 1128 Last data filed at 02/02/12 0600  Gross per 24 hour  Intake 903.67 ml  Output      0 ml  Net 903.67 ml    Physical Exam: Affect appropriate Healthy:  appears stated age HEENT: normal Neck supple with no adenopathy JVP normal no bruits no thyromegaly Lungs clear with no wheezing and good diaphragmatic motion Heart:  S1/S2 no murmur, no rub, gallop or click PMI normal Abdomen: benighn, BS positve, no tenderness, no AAA no bruit.  No HSM or HJR Distal pulses intact with no bruits No edema Neuro non-focal Skin warm and dry No muscular weakness   Lab Results: Basic Metabolic Panel:  Basename 02/02/12 0109 02/01/12 1929 02/01/12 1612  NA 140 -- 141  K 3.6 -- 3.8  CL 104 -- 107  CO2 25 -- --  GLUCOSE 95 -- 91  BUN 14 -- 15  CREATININE 1.65* -- 1.50*  CALCIUM 9.4 -- --  MG -- 1.8 --  PHOS -- -- --   Liver Function Tests:  [facility] 02/02/12 0109  AST 13  ALT 11  ALKPHOS 99  BILITOT 1.9*  PROT 6.2  ALBUMIN 3.7   No results found for this basename: LIPASE:2,AMYLASE:2 in the last 72 hours CBC:  Basename 02/02/12 0109 02/01/12 1929  WBC 5.1 4.0  NEUTROABS -- 2.4  HGB 14.7 14.1  HCT 44.5 44.3  MCV 89.7 89.3  PLT 179 163   Cardiac Enzymes:  Basename 02/02/12 0905 02/02/12 0109 02/01/12 1930  CKTOTAL -- -- --  CKMB -- -- --  CKMBINDEX  -- -- --  TROPONINI <0.30 <0.30 <0.30   Hemoglobin A1C:  Basename 02/01/12 1929  HGBA1C 5.9*   Fasting Lipid Panel:  Basename 02/02/12 0110  CHOL 130  HDL 56  LDLCALC 64  TRIG 52  CHOLHDL 2.3  LDLDIRECT --   Thyroid Function Tests:  Basename 02/01/12 1929  TSH 1.043  T4TOTAL --  T3FREE --  THYROIDAB --    Imaging: Dg Chest 2 View  02/01/2012  *RADIOLOGY REPORT*  Clinical Data: Chest tightness, atrial fibrillation.  CHEST - 2 VIEW  Comparison: 10/10/2009.  Findings: Trachea is midline.  Heart size stable.  Large hiatal hernia.  Linear scarring at the left costophrenic angle.  Lungs are otherwise clear.  No pleural fluid.  IMPRESSION: No acute findings.  Large hiatal hernia.   Original Report Authenticated By: Leanna Battles, M.D.     Cardiac Studies:  ECG:  afib nonspecific ST/T wave changes   Telemetry:  afib rates 80;s better control  Echo:  Pending   Medications:     . allopurinol  300 mg Oral Daily  . amLODipine  10 mg Oral Daily  . aspirin EC  81 mg Oral Daily  .  doxazosin  4 mg Oral Daily  . gi cocktail  30 mL Oral Once  . metoCLOPramide  5 mg Oral TID AC & HS  . metoprolol tartrate  12.5 mg Oral Q6H  . pantoprazole  40 mg Oral Daily  . simvastatin  10 mg Oral QHS       . sodium chloride 75 mL/hr at 02/01/12 2002  . heparin      Assessment/Plan:  Afib:  Rate control better.  Change lopressor to 25 bid.  GFR 44 and Cr 1.65  Hydrate today Atleast give one 15mg  dose Of xarelto today.  If GFR over 50 with hydration may be able to d/c with normal 20 mg dose  Echo pending GI:  Continue reglan and pantoprazole  F/U with Dr Russella Dar as outpatient Chol:  Continue statin.  Charlton Haws 02/02/2012, 11:28 AM

## 2012-02-02 NOTE — ED Provider Notes (Signed)
I saw and evaluated the patient, reviewed the resident's note and I agree with the findings and plan.   .Face to face Exam:  General:  Awake HEENT:  Atraumatic Resp:  Normal effort Abd:  Nondistended Neuro:No focal weakness Lymph: No adenopathy   Nelia Shi, MD 02/02/12 1712

## 2012-02-02 NOTE — Progress Notes (Signed)
ANTICOAGULATION CONSULT NOTE - Follow Up Consult  Pharmacy Consult for heparin Indication: atrial fibrillation  Labs:  Basename 02/02/12 0109 02/01/12 1930 02/01/12 1929 02/01/12 1612  HGB 14.7 -- 14.1 --  HCT 44.5 -- 44.3 47.0  PLT 179 -- 163 --  APTT -- -- 31 --  LABPROT -- -- 13.7 --  INR -- -- 1.06 --  HEPARINUNFRC 1.03* -- -- --  CREATININE 1.65* -- -- 1.50*  CKTOTAL -- -- -- --  CKMB -- -- -- --  TROPONINI <0.30 <0.30 -- --    Assessment: 61yo male supratherapeutic on heparin with initial dosing for Afib; labs were drawn just 5hr after bolus which may still have some effect.  Goal of Therapy:  Heparin level 0.3-0.7 units/ml   Plan:  Will decrease heparin gtt by 2 units/kg/hr to 1400 units/hr and check level in 6hr.  Colleen Can PharmD BCPS 02/02/2012,2:46 AM

## 2012-02-03 DIAGNOSIS — N179 Acute kidney failure, unspecified: Secondary | ICD-10-CM

## 2012-02-03 DIAGNOSIS — R112 Nausea with vomiting, unspecified: Secondary | ICD-10-CM

## 2012-02-03 DIAGNOSIS — K449 Diaphragmatic hernia without obstruction or gangrene: Secondary | ICD-10-CM

## 2012-02-03 DIAGNOSIS — I4891 Unspecified atrial fibrillation: Secondary | ICD-10-CM

## 2012-02-03 DIAGNOSIS — R1013 Epigastric pain: Secondary | ICD-10-CM

## 2012-02-03 DIAGNOSIS — Z720 Tobacco use: Secondary | ICD-10-CM

## 2012-02-03 LAB — CBC
MCHC: 32.7 g/dL (ref 30.0–36.0)
Platelets: 163 10*3/uL (ref 150–400)
RDW: 13.5 % (ref 11.5–15.5)

## 2012-02-03 LAB — BASIC METABOLIC PANEL
GFR calc Af Amer: 55 mL/min — ABNORMAL LOW (ref >90)
GFR calc non Af Amer: 48 mL/min — ABNORMAL LOW (ref >90)
Potassium: 3.5 mEq/L (ref 3.5–5.1)
Sodium: 138 mEq/L (ref 135–145)

## 2012-02-03 MED ORDER — METOCLOPRAMIDE HCL 5 MG PO TABS: 5.0000 mg | ORAL_TABLET | Freq: Three times a day (TID) | ORAL | Status: DC

## 2012-02-03 MED ORDER — RIVAROXABAN 20 MG PO TABS: 20.0000 mg | ORAL_TABLET | Freq: Every day | ORAL | Status: DC

## 2012-02-03 MED ORDER — METOPROLOL TARTRATE 25 MG PO TABS: 25.0000 mg | ORAL_TABLET | Freq: Two times a day (BID) | ORAL | Status: DC

## 2012-02-03 NOTE — Progress Notes (Signed)
UR Completed.  Drucella Karbowski Jane [phone] 02/03/2012  

## 2012-02-03 NOTE — Discharge Summary (Signed)
Discharge Summary   Patient ID: James Santiago,  MRN: [id number], DOB/AGE: [birth year] 60 y.o.  Admit date: 02/01/2012 Discharge date: 02/03/2012  Primary Physician: No primary provider on file. Primary Cardiologist: Rollene Rotunda, MD  Discharge Diagnoses Principal Problem:  *Atrial fibrillation with RVR Active Problems:  Acute renal failure  Epigastric pain  Nausea & vomiting  Tobacco abuse  Hiatal hernia   Allergies No Known Allergies  Diagnostic Studies/Procedures  PA/LATERAL CHEST X-RAY - 02/01/12  IMPRESSION:  No acute findings. Large hiatal hernia.   TRANSTHORACIC ECHOCARDIOGRAM - 02/01/12  - Left ventricle: The cavity size was normal. Wall thickness was normal. The estimated ejection fraction was 55%.  - Right ventricle: The cavity size was mildly dilated. - Right atrium: The atrium was mildly dilated. - Atrial septum: No defect or patent foramen ovale was identified.  History of Present Illness/Hospital Course  James Santiago is a 61yo male who was admitted to [facility] hospital on 02/01/12 with the above problem list. The week prior to admission, he reported experiencing post-prandial epigastric and chest discomfort w/o associated symptoms. He would make himself vomit with resolution of symptoms. The day of admission, he noticed chest/epigastric pain upon awakening. He skipped breakfast and saw his PCP at 11:30 AM. He was found to be in a-fib with RVR. He was sent to the ED for further management. There, he received IV BB with improvement of rate. He was started on IV heparin. CXR as above revealed no acute cardiopulmonary abnormalities. Initial TnI WNL. BMET did suggest acute renal insufficiency. This was suspected to be prerenal in the setting of vomiting, however he does have a history of nephrolithiasis. Micardis was held. He was admitted for further evaluation. He was hydrated overnight. Three subsequent sets of TnI returned WNL. Lipid panel as below  revealed good LDL control. Hgb A1C 5.9%. TSH WNL. A 2D echo was performed revealing LVEF 55%, mild RV/RA dilatation. He remained in atrial fibrillation, rate controlled on Lopressor. He was started on Xarelto for anticoagulation. His renal function improved with hydration, and Xarelto 20mg  daily was prescribed given increasing CrCl. The patient's chest pain was suspected to be more epigastric in nature given his history of a large hiatal hernia, and change in quality post-prandially and after vomiting. He was continued on PPI and started on Reglan for gastric emptying. He was evaluated by Dr. Eden Emms today, and found to be stable for discharge. He will be discharged on the medications below. Micardis will be held until follow-up with Dr. Antoine Poche in the setting of the patient's resolving acute renal failure and well-controlled BP on up-titrated Lopressor therapy for rate-control. The recommendation was made to consider DC cardioversion in 3 weeks if the patient is still in atrial fibrillation. This information has been clearly outlined in the discharge AVS.   Discharge Vitals:  Blood pressure 110/87, pulse 74, temperature 97.6 F (36.4 C), temperature source Oral, resp. rate 18, height 6\' 4"  (1.93 m), weight 110.315 kg (243 lb 3.2 oz), SpO2 95.00%.   Labs: Recent Labs  Basename 02/03/12 0440 02/02/12 0109   WBC 4.4 5.1   HGB 14.2 14.7   HCT 43.4 44.5   MCV 90.0 89.7   PLT 163 179    Lab 02/03/12 0440 02/02/12 0109 02/01/12 1612  NA 138 140 141  K 3.5 3.6 3.8  CL 103 104 107  CO2 24 25 --  BUN 14 14 15   CREATININE 1.53* 1.65* 1.50*  CALCIUM 9.5 9.4 --  PROT -- 6.2 --  BILITOT -- 1.9* --  ALKPHOS -- 99 --  ALT -- 11 --  AST -- 13 --  AMYLASE -- -- --  LIPASE -- -- --  GLUCOSE 101* 95 91   Recent Labs  Basename 02/01/12 1929   HGBA1C 5.9*   Recent Labs  Basename 02/02/12 0905 02/02/12 0109 02/01/12 1930   CKTOTAL -- -- --   CKMB -- -- --   CKMBINDEX -- -- --   TROPONINI <0.30  <0.30 <0.30    Recent Labs  Basename 02/02/12 0110   CHOL 130   HDL 56   LDLCALC 64   TRIG 52   CHOLHDL 2.3   LDLDIRECT --    Basename 02/01/12 1929  TSH 1.043  T4TOTAL --  T3FREE --  THYROIDAB --    Disposition:  Discharge Orders    Future Orders Please Complete By Expires   Diet - low sodium heart healthy      Increase activity slowly        Follow-up Information    Follow up with [facility]. (Office will call with an appointment date and time. )    Contact information:   [address] [place] [address]       Schedule an appointment as soon as possible for a visit with Judie Petit T. Russella Dar, MD,FACG. (In 1-2 weeks)    Contact information:   [address] [place] [employer] [place] [address] [phone]          Discharge Medications:    Medication List     As of 02/03/2012  9:59 AM    START taking these medications         metoCLOPramide 5 MG tablet   Commonly known as: REGLAN   Take 1 tablet (5 mg total) by mouth 4 (four) times daily -  before meals and at bedtime.      metoprolol tartrate 25 MG tablet   Commonly known as: LOPRESSOR   Take 1 tablet (25 mg total) by mouth 2 (two) times daily.      Rivaroxaban 20 MG Tabs   Commonly known as: XARELTO   Take 1 tablet (20 mg total) by mouth daily with supper.      CONTINUE taking these medications         allopurinol 300 MG tablet   Commonly known as: ZYLOPRIM      amLODipine 10 MG tablet   Commonly known as: NORVASC      aspirin EC 81 MG tablet      doxazosin 4 MG tablet   Commonly known as: CARDURA      omeprazole 20 MG capsule   Commonly known as: PRILOSEC      simvastatin 10 MG tablet   Commonly known as: ZOCOR      STOP taking these medications         telmisartan 80 MG tablet   Commonly known as: MICARDIS          Where to get your medications    These are the prescriptions that you need to pick up. We sent them to a specific pharmacy,  so you will need to go there to get them.   [facility] [address] [address], [place] - [address] AT [facility] [address]    [address] [place] [address]    Phone: [phone]        metoCLOPramide 5 MG tablet  metoprolol tartrate 25 MG tablet   Rivaroxaban 20 MG Tabs           Outstanding Labs/Studies: None  Duration of Discharge Encounter: Greater than 30 minutes including physician time.  Signed, R. Hurman Horn, PA-C 02/03/2012, 9:59 AM

## 2012-02-03 NOTE — Progress Notes (Signed)
Patient ID: JASKARN SCHWEER, male   DOB: [birth year], 61 y.o.   MRN: [id number]    Subjective:  Denies SSCP, palpitations or Dyspnea Abdominal pain improved had breakfast Wants to go home  Objective:  Filed Vitals:   02/02/12 0500 02/02/12 1400 02/02/12 2100 02/03/12 0500  BP: 105/78 111/94 110/76 110/87  Pulse: 84 54 87 74  Temp: 98.3 F (36.8 C) 97.6 F (36.4 C) 98.1 F (36.7 C) 97.6 F (36.4 C)  TempSrc: Oral Oral    Resp:  16 18 18   Height:      Weight: 248 lb (112.492 kg)   243 lb 3.2 oz (110.315 kg)  SpO2: 96% 97% 96% 95%    Intake/Output from previous day:  Intake/Output Summary (Last 24 hours) at 02/03/12 0851 Last data filed at 02/02/12 1800  Gross per 24 hour  Intake    600 ml  Output      0 ml  Net    600 ml    Physical Exam: Affect appropriate Healthy:  appears stated age HEENT: normal Neck supple with no adenopathy JVP normal no bruits no thyromegaly Lungs clear with no wheezing and good diaphragmatic motion Heart:  S1/S2 no murmur, no rub, gallop or click PMI normal Abdomen: benighn, BS positve, no tenderness, no AAA no bruit.  No HSM or HJR Distal pulses intact with no bruits No edema Neuro non-focal Skin warm and dry No muscular weakness   Lab Results: Basic Metabolic Panel:  Basename 02/03/12 0440 02/02/12 0109 02/01/12 1929  NA 138 140 --  K 3.5 3.6 --  CL 103 104 --  CO2 24 25 --  GLUCOSE 101* 95 --  BUN 14 14 --  CREATININE 1.53* 1.65* --  CALCIUM 9.5 9.4 --  MG -- -- 1.8  PHOS -- -- --   Liver Function Tests:  [facility] 02/02/12 0109  AST 13  ALT 11  ALKPHOS 99  BILITOT 1.9*  PROT 6.2  ALBUMIN 3.7   No results found for this basename: LIPASE:2,AMYLASE:2 in the last 72 hours CBC:  Basename 02/03/12 0440 02/02/12 0109 02/01/12 1929  WBC 4.4 5.1 --  NEUTROABS -- -- 2.4  HGB 14.2 14.7 --  HCT 43.4 44.5 --  MCV 90.0 89.7 --  PLT 163 179 --   Cardiac Enzymes:  Basename 02/02/12 0905 02/02/12 0109 02/01/12 1930    CKTOTAL -- -- --  CKMB -- -- --  CKMBINDEX -- -- --  TROPONINI <0.30 <0.30 <0.30   Hemoglobin A1C:  Basename 02/01/12 1929  HGBA1C 5.9*   Fasting Lipid Panel:  Basename 02/02/12 0110  CHOL 130  HDL 56  LDLCALC 64  TRIG 52  CHOLHDL 2.3  LDLDIRECT --   Thyroid Function Tests:  Basename 02/01/12 1929  TSH 1.043  T4TOTAL --  T3FREE --  THYROIDAB --    Imaging: Dg Chest 2 View  02/01/2012  *RADIOLOGY REPORT*  Clinical Data: Chest tightness, atrial fibrillation.  CHEST - 2 VIEW  Comparison: 10/10/2009.  Findings: Trachea is midline.  Heart size stable.  Large hiatal hernia.  Linear scarring at the left costophrenic angle.  Lungs are otherwise clear.  No pleural fluid.  IMPRESSION: No acute findings.  Large hiatal hernia.   Original Report Authenticated By: Leanna Battles, M.D.     Cardiac Studies:  ECG:  afib nonspecific ST/T wave changes   Telemetry:  afib rates 80;s better control  Echo:  Pending   Medications:      . allopurinol  300 mg  Oral Daily  . amLODipine  10 mg Oral Daily  . aspirin EC  81 mg Oral Daily  . doxazosin  4 mg Oral Daily  . gi cocktail  30 mL Oral Once  . metoCLOPramide  5 mg Oral TID AC & HS  . metoprolol tartrate  12.5 mg Oral Q6H  . pantoprazole  40 mg Oral Daily  . rivaroxaban  20 mg Oral Q supper  . simvastatin  10 mg Oral QHS        . sodium chloride 75 mL/hr at 02/01/12 2002    Assessment/Plan:  Afib:  Rate control better.  Change lopressor to 25 bid.  Cr improved and pharmacy feels good with 20 mg dose of xarelto Echo is normal EF 55% GI:  Continue reglan and pantoprazole  F/U with Dr Russella Dar as outpatient Chol:  Continue statin.  D/C home F/U [place] possible DCC in 3 weeks  Charlton Haws 02/03/2012, 8:51 AM

## 2012-02-03 NOTE — Progress Notes (Signed)
02/03/2012  Late entry for 1000 am- NCM provided pt with Xarelto card to take to his pharmacy for copay assistance. Explained he would have to call on Monday to activate his card. And his [facility] will locate a pharmacy if they do not have in stock. Isidoro Donning RN CCM Case Mgmt phone [phone]

## 2012-02-27 ENCOUNTER — Encounter: Payer: Self-pay | Admitting: Gastroenterology

## 2012-02-27 ENCOUNTER — Ambulatory Visit (INDEPENDENT_AMBULATORY_CARE_PROVIDER_SITE_OTHER): Payer: 59 | Admitting: Gastroenterology

## 2012-02-27 VITALS — BP 130/70 | HR 60 | Ht 74.0 in | Wt 256.0 lb

## 2012-02-27 DIAGNOSIS — K219 Gastro-esophageal reflux disease without esophagitis: Secondary | ICD-10-CM

## 2012-02-27 DIAGNOSIS — Z860100 Personal history of colon polyps, unspecified: Secondary | ICD-10-CM

## 2012-02-27 DIAGNOSIS — R1013 Epigastric pain: Secondary | ICD-10-CM

## 2012-02-27 MED ORDER — OMEPRAZOLE 20 MG PO CPDR: 20.0000 mg | DELAYED_RELEASE_CAPSULE | Freq: Two times a day (BID) | ORAL | Status: DC

## 2012-02-27 NOTE — Progress Notes (Signed)
History of Present Illness: This is a 62 year old male with a history of GERD. He was hospitalized in December for epigastric pain, vomiting and new onset atrial fibrillation. Metoclopramide was added to his regimen of omeprazole 20 mg daily and his reflux symptoms have come under complete control. He underwent upper endoscopy 01/2005 showing only a 5 cm hiatal hernia. She relates a history of cholelithiasis diagnosed many years ago. Denies weight loss, abdominal pain, constipation, diarrhea, change in stool caliber, melena, hematochezia, nausea, dysphagia, chest pain.  Review of Systems: Pertinent positive and negative review of systems were noted in the above HPI section. All other review of systems were otherwise negative.  Current Medications, Allergies, Past Medical History, Past Surgical History, Family History and Social History were reviewed in [employer] record.  Physical Exam: General: Well developed , well nourished, no acute distress Head: Normocephalic and atraumatic Eyes:  sclerae anicteric, EOMI Ears: Normal auditory acuity Mouth: No deformity or lesions Neck: Supple, no masses or thyromegaly Lungs: Clear throughout to auscultation Heart: Regular rate and rhythm; no murmurs, rubs or bruits Abdomen: Soft, non tender and non distended. No masses, hepatosplenomegaly or hernias noted. Normal Bowel sounds Musculoskeletal: Symmetrical with no gross deformities  Skin: No lesions on visible extremities Pulses:  Normal pulses noted Extremities: No clubbing, cyanosis, edema or deformities noted Neurological: Alert oriented x 4, grossly nonfocal Cervical Nodes:  No significant cervical adenopathy Inguinal Nodes: No significant inguinal adenopathy Psychological:  Alert and cooperative. Normal mood and affect  Assessment and Recommendations:  1. GERD, hiatal hernia. All standard antireflux measures. Increase omeprazole to 40 mg twice a day and then discontinue  metoclopramide. If this regimen does not adequately control his symptoms will increase omeprazole 40 mg twice a day and proceed with an abdominal ultrasound. If his symptoms remain difficult to control consider upper endoscopy.  2. Personal history of adenomatous colon polyps. Surveillance colonoscopy recommended May 2015.  3. New onset atrial fibrillation managed on Xarelto. Followup with Dr. Antoine Poche on Monday

## 2012-02-27 NOTE — Patient Instructions (Addendum)
You have been given a separate informational sheet regarding your tobacco use, the importance of quitting and local resources to help you quit.  Increase your omeprazole to 20 mg one tablet by mouth twice daily. A prescription has been sent to your pharmacy for one month. If you would like the prescription sent to a mail order after the prescription runs out then contact our office.   Stop taking the metoclopramide a week after starting the omeprazole twice daily dosing.  You will be due for a recall colonoscopy in 06/2013. We will send you a reminder in the mail when it gets closer to that time.  Patient advised to avoid spicy, acidic, citrus, chocolate, mints, fruit and fruit juices.  Limit the intake of caffeine, alcohol and Soda.  Don't exercise too soon after eating.  Don't lie down within 3-4 hours of eating.  Elevate the head of your bed.  cc: Lynnea Ferrier, MD

## 2012-03-03 ENCOUNTER — Ambulatory Visit (INDEPENDENT_AMBULATORY_CARE_PROVIDER_SITE_OTHER): Payer: 59 | Admitting: Cardiology

## 2012-03-03 ENCOUNTER — Encounter: Payer: Self-pay | Admitting: Cardiology

## 2012-03-03 VITALS — BP 124/88 | HR 80 | Ht 74.0 in | Wt 249.0 lb

## 2012-03-03 DIAGNOSIS — I4891 Unspecified atrial fibrillation: Secondary | ICD-10-CM

## 2012-03-03 NOTE — Patient Instructions (Addendum)
The current medical regimen is effective;  continue present plan and medications.  Your physician has recommended that you have a Cardioversion (DCCV). Electrical Cardioversion uses a jolt of electricity to your heart either through paddles or wired patches attached to your chest. This is a controlled, usually prescheduled, procedure. Defibrillation is done under light anesthesia in the hospital, and you usually go home the day of the procedure. This is done to get your heart back into a normal rhythm. You are not awake for the procedure. Please see the instruction sheet given to you today.  Follow up will be scheduled after cardioversion.

## 2012-03-03 NOTE — Progress Notes (Signed)
HPI The patient was recently admitted with some atrial fibrillation. He also had some renal insufficiency thought to be related to nausea vomiting and dehydration.  Echocardiogram was essentially unremarkable. He was treated with rate control. He was started on Xarelto with plans for possible cardioversion.   He returns now for followup. He has done well. He denies any palpitations and he never really felt this rhythm. He denies any chest pressure, neck or arm discomfort. He has no shortness of breath, PND or orthopnea. He said that the nausea that he did have previously is no longer bothering him.  No Known Allergies  Current Outpatient Prescriptions  Medication Sig Dispense Refill  . allopurinol (ZYLOPRIM) 300 MG tablet Take 300 mg by mouth daily.      Marland Kitchen amLODipine (NORVASC) 10 MG tablet Take 10 mg by mouth daily.      Marland Kitchen aspirin EC 81 MG tablet Take 81 mg by mouth daily.      Marland Kitchen doxazosin (CARDURA) 4 MG tablet Take 4 mg by mouth daily.      . metoCLOPramide (REGLAN) 5 MG tablet Take 1 tablet (5 mg total) by mouth 4 (four) times daily -  before meals and at bedtime.  120 tablet  3  . metoprolol tartrate (LOPRESSOR) 25 MG tablet Take 1 tablet (25 mg total) by mouth 2 (two) times daily.  60 tablet  3  . omeprazole (PRILOSEC) 20 MG capsule Take 1 capsule (20 mg total) by mouth 2 (two) times daily.  60 capsule  1  . Rivaroxaban (XARELTO) 20 MG TABS Take 1 tablet (20 mg total) by mouth daily with supper.  30 tablet  3  . simvastatin (ZOCOR) 10 MG tablet Take 10 mg by mouth at bedtime.        Past Medical History  Diagnosis Date  . Hypertension   . GERD (gastroesophageal reflux disease)   . Gout   . A-fib     a. Dx 01/2012, CHADS2 = 1.  . Hiatal hernia   . Nephrolithiasis   . Cholelithiasis     "still got 2 small stones in there" (02/01/2012)  . Hypercholesteremia   . Arthritis     "fingers" (02/01/2012)  . Adenomatous colon polyp 06/2003  . Hemorrhoids     Past Surgical History    Procedure Date  . Inguinal hernia repair ? 2008    "right" (02/01/2012)  . Cystoscopy with retrograde pyelogram, ureteroscopy and stent placement 1980's  . Cystoscopy w/ ureteroscopy w/ lithotripsy 1980's  . Vasectomy     ROS:  As stated in the HPI and negative for all other systems.  PHYSICAL EXAM BP 124/88  Pulse 80  Ht 6\' 2"  (1.88 m)  Wt 249 lb (112.946 kg)  BMI 31.97 kg/m2 GENERAL:  Well appearing HEENT:  Pupils equal round and reactive, fundi not visualized, oral mucosa unremarkable NECK:  No jugular venous distention, waveform within normal limits, carotid upstroke brisk and symmetric, no bruits, no thyromegaly LYMPHATICS:  No cervical, inguinal adenopathy LUNGS:  Clear to auscultation bilaterally CHEST:  Unremarkable HEART:  PMI not displaced or sustained,S1 and S2 within normal limits, no S3, no S4, no clicks, no rubs, no murmurs ABD:  Flat, positive bowel sounds normal in frequency in pitch, no bruits, no rebound, no guarding, no midline pulsatile mass, no hepatomegaly, no splenomegaly EXT:  2 plus pulses throughout, no edema, no cyanosis no clubbing  EKG:  Atrial fibrillation, rate 92, RAD, intervals within normal limits, no acute ST-T wave  changes.  03/03/2012  ASSESSMENT AND PLAN  ATRIAL FIBRILLATION The patient is still in atrial fibrillation and the plan is cardioversion.  He has been on 3 weeks of therapeutic anticoagulation. We discussed the risks benefits and he agrees to proceed. We will bring him back for elective outpatient cardioversion. He will remain on the Xarelto for one month following this though I don't think he will need this long-term.  HTN The blood pressure is at target. No change in medications is indicated. We will continue with therapeutic lifestyle changes (TLC).

## 2012-03-04 ENCOUNTER — Telehealth: Payer: Self-pay | Admitting: *Deleted

## 2012-03-04 ENCOUNTER — Encounter: Payer: Self-pay | Admitting: *Deleted

## 2012-03-04 NOTE — Telephone Encounter (Signed)
Cardioversion scheduled for 03/06/12 11:30am arrive  10:30am Dr Jens Som. Pt advised, verbalized understanding. Instructions for cardioversion reviewed with patient.

## 2012-03-05 ENCOUNTER — Other Ambulatory Visit: Payer: Self-pay | Admitting: Cardiology

## 2012-03-05 ENCOUNTER — Encounter (HOSPITAL_COMMUNITY): Payer: Self-pay | Admitting: Pharmacy Technician

## 2012-03-05 DIAGNOSIS — I4891 Unspecified atrial fibrillation: Secondary | ICD-10-CM

## 2012-03-06 ENCOUNTER — Encounter (HOSPITAL_COMMUNITY): Payer: Self-pay | Admitting: Anesthesiology

## 2012-03-06 ENCOUNTER — Ambulatory Visit (HOSPITAL_COMMUNITY)
Admission: RE | Admit: 2012-03-06 | Discharge: 2012-03-06 | Disposition: A | Discharge status: Home / Self Care | Payer: 59 | Source: Ambulatory Visit | Attending: Cardiology | Admitting: Cardiology

## 2012-03-06 ENCOUNTER — Encounter (HOSPITAL_COMMUNITY)
Admission: RE | Disposition: A | Discharge status: Home / Self Care | Payer: Self-pay | Source: Ambulatory Visit | Attending: Cardiology

## 2012-03-06 ENCOUNTER — Ambulatory Visit (HOSPITAL_COMMUNITY): Payer: 59 | Admitting: Anesthesiology

## 2012-03-06 ENCOUNTER — Encounter (HOSPITAL_COMMUNITY): Payer: Self-pay

## 2012-03-06 DIAGNOSIS — Z7901 Long term (current) use of anticoagulants: Secondary | ICD-10-CM | POA: Insufficient documentation

## 2012-03-06 DIAGNOSIS — I4891 Unspecified atrial fibrillation: Secondary | ICD-10-CM | POA: Insufficient documentation

## 2012-03-06 DIAGNOSIS — E7800 Pure hypercholesterolemia, unspecified: Secondary | ICD-10-CM | POA: Insufficient documentation

## 2012-03-06 DIAGNOSIS — I10 Essential (primary) hypertension: Secondary | ICD-10-CM | POA: Insufficient documentation

## 2012-03-06 HISTORY — PX: CARDIOVERSION: SHX1299

## 2012-03-06 SURGERY — CARDIOVERSION
Anesthesia: General

## 2012-03-06 MED ORDER — PROPOFOL 10 MG/ML IV BOLUS
INTRAVENOUS | Status: DC | PRN
  Administered 2012-03-06: 100 mg via INTRAVENOUS

## 2012-03-06 MED ORDER — SODIUM CHLORIDE 0.9 % IV SOLN
INTRAVENOUS | Status: DC | PRN
  Administered 2012-03-06: 12:00:00 via INTRAVENOUS

## 2012-03-06 MED ORDER — LIDOCAINE HCL (CARDIAC) 20 MG/ML IV SOLN
INTRAVENOUS | Status: DC | PRN
  Administered 2012-03-06: 20 mg via INTRAVENOUS

## 2012-03-06 NOTE — Procedures (Signed)
Electrical Cardioversion Procedure Note James Santiago [id number] October 10, 1950  Procedure: Electrical Cardioversion Indications:  Atrial Fibrillation  He has been on Xarelto without missing a dose for more than 1 month.   Procedure Details Consent: Risks of procedure as well as the alternatives and risks of each were explained to the (patient/caregiver).  Consent for procedure obtained. Time Out: Verified patient identification, verified procedure, site/side was marked, verified correct patient position, special equipment/implants available, medications/allergies/relevent history reviewed, required imaging and test results available.  Performed  Patient placed on cardiac monitor, pulse oximetry, supplemental oxygen as necessary.  Sedation given: Propofol IV Pacer pads placed anterior and posterior chest.  Cardioverted 1 time(s).  Cardioverted at 200J.  Evaluation Findings: Post procedure EKG shows: NSR Complications: None Patient did tolerate procedure well.   Marca Ancona 03/06/2012, 11:46 AM

## 2012-03-06 NOTE — Anesthesia Postprocedure Evaluation (Signed)
  Anesthesia Post-op Note  Patient: James Santiago  Procedure(s) Performed: Procedure(s) (LRB) with comments: CARDIOVERSION (N/A)  Patient Location: Endoscopy Unit  Anesthesia Type:General  Level of Consciousness: awake, alert  and oriented  Airway and Oxygen Therapy: Patient Spontanous Breathing  Post-op Pain: none  Post-op Assessment: Post-op Vital signs reviewed, Patient's Cardiovascular Status Stable, Respiratory Function Stable, Patent Airway, No signs of Nausea or vomiting, Adequate PO intake, Pain level controlled, No headache, No backache, No residual numbness and No residual motor weakness  Post-op Vital Signs: Reviewed and stable  Complications: No apparent anesthesia complications

## 2012-03-06 NOTE — Preoperative (Signed)
Beta Blockers   Reason not to administer Beta Blockers:Not Applicable 

## 2012-03-06 NOTE — Interval H&P Note (Signed)
History and Physical Interval Note:  03/06/2012 11:44 AM  James Santiago  has presented today for surgery, with the diagnosis of a-fib  The various methods of treatment have been discussed with the patient and family. After consideration of risks, benefits and other options for treatment, the patient has consented to  Procedure(s) (LRB) with comments: CARDIOVERSION (N/A) as a surgical intervention .  The patient's history has been reviewed, patient examined, no change in status, stable for surgery.  I have reviewed the patient's chart and labs.  Questions were answered to the patient's satisfaction.     Deniece Rankin [employer]

## 2012-03-06 NOTE — Transfer of Care (Signed)
Immediate Anesthesia Transfer of Care Note  Patient: James Santiago  Procedure(s) Performed: Procedure(s) (LRB) with comments: CARDIOVERSION (N/A)  Patient Location: Endoscopy Unit  Anesthesia Type:General  Level of Consciousness: awake, alert  and oriented  Airway & Oxygen Therapy: Patient Spontanous Breathing and Patient connected to nasal cannula oxygen  Post-op Assessment: Report given to PACU RN and Post -op Vital signs reviewed and stable  Post vital signs: Reviewed and stable  Complications: No apparent anesthesia complications

## 2012-03-06 NOTE — Anesthesia Preprocedure Evaluation (Signed)
Anesthesia Evaluation  Patient identified by MRN, date of birth, ID band Patient awake    Reviewed: Allergy & Precautions, H&P , NPO status , Patient's Chart, lab work & pertinent test results  Airway Mallampati: II TM Distance: >3 FB Neck ROM: Full    Dental   Pulmonary  breath sounds clear to auscultation        Cardiovascular hypertension, + dysrhythmias Atrial Fibrillation Rhythm:Irregular Rate:Normal     Neuro/Psych  Neuromuscular disease    GI/Hepatic   Endo/Other    Renal/GU Renal disease     Musculoskeletal   Abdominal (+) + obese,   Peds  Hematology   Anesthesia Other Findings   Reproductive/Obstetrics                           Anesthesia Physical Anesthesia Plan  ASA: III  Anesthesia Plan: General   Post-op Pain Management:    Induction: Intravenous  Airway Management Planned: Mask  Additional Equipment:   Intra-op Plan:   Post-operative Plan:   Informed Consent: I have reviewed the patients History and Physical, chart, labs and discussed the procedure including the risks, benefits and alternatives for the proposed anesthesia with the patient or authorized representative who has indicated his/her understanding and acceptance.     Plan Discussed with: CRNA and Surgeon  Anesthesia Plan Comments:         Anesthesia Quick Evaluation

## 2012-03-06 NOTE — Anesthesia Postprocedure Evaluation (Signed)
  Anesthesia Post-op Note  Patient: James Santiago  Procedure(s) Performed: Procedure(s) (LRB) with comments: CARDIOVERSION (N/A)  Patient Location: PACU  Anesthesia Type:General  Level of Consciousness: awake  Airway and Oxygen Therapy: Patient Spontanous Breathing  Post-op Pain: none  Post-op Assessment: Post-op Vital signs reviewed, Patient's Cardiovascular Status Stable, Respiratory Function Stable, Patent Airway, No signs of Nausea or vomiting and Pain level controlled  Post-op Vital Signs: stable  Complications: No apparent anesthesia complications

## 2012-03-06 NOTE — H&P (View-Only) (Signed)
 HPI The patient was recently admitted with some atrial fibrillation. He also had some renal insufficiency thought to be related to nausea vomiting and dehydration.  Echocardiogram was essentially unremarkable. He was treated with rate control. He was started on Xarelto with plans for possible cardioversion.   He returns now for followup. He has done well. He denies any palpitations and he never really felt this rhythm. He denies any chest pressure, neck or arm discomfort. He has no shortness of breath, PND or orthopnea. He said that the nausea that he did have previously is no longer bothering him.  No Known Allergies  Current Outpatient Prescriptions  Medication Sig Dispense Refill  . allopurinol (ZYLOPRIM) 300 MG tablet Take 300 mg by mouth daily.      . amLODipine (NORVASC) 10 MG tablet Take 10 mg by mouth daily.      . aspirin EC 81 MG tablet Take 81 mg by mouth daily.      . doxazosin (CARDURA) 4 MG tablet Take 4 mg by mouth daily.      . metoCLOPramide (REGLAN) 5 MG tablet Take 1 tablet (5 mg total) by mouth 4 (four) times daily -  before meals and at bedtime.  120 tablet  3  . metoprolol tartrate (LOPRESSOR) 25 MG tablet Take 1 tablet (25 mg total) by mouth 2 (two) times daily.  60 tablet  3  . omeprazole (PRILOSEC) 20 MG capsule Take 1 capsule (20 mg total) by mouth 2 (two) times daily.  60 capsule  1  . Rivaroxaban (XARELTO) 20 MG TABS Take 1 tablet (20 mg total) by mouth daily with supper.  30 tablet  3  . simvastatin (ZOCOR) 10 MG tablet Take 10 mg by mouth at bedtime.        Past Medical History  Diagnosis Date  . Hypertension   . GERD (gastroesophageal reflux disease)   . Gout   . A-fib     a. Dx 01/2012, CHADS2 = 1.  . Hiatal hernia   . Nephrolithiasis   . Cholelithiasis     "still got 2 small stones in there" (02/01/2012)  . Hypercholesteremia   . Arthritis     "fingers" (02/01/2012)  . Adenomatous colon polyp 06/2003  . Hemorrhoids     Past Surgical History    Procedure Date  . Inguinal hernia repair ? 2008    "right" (02/01/2012)  . Cystoscopy with retrograde pyelogram, ureteroscopy and stent placement 1980's  . Cystoscopy w/ ureteroscopy w/ lithotripsy 1980's  . Vasectomy     ROS:  As stated in the HPI and negative for all other systems.  PHYSICAL EXAM BP 124/88  Pulse 80  Ht 6' 2" (1.88 m)  Wt 249 lb (112.946 kg)  BMI 31.97 kg/m2 GENERAL:  Well appearing HEENT:  Pupils equal round and reactive, fundi not visualized, oral mucosa unremarkable NECK:  No jugular venous distention, waveform within normal limits, carotid upstroke brisk and symmetric, no bruits, no thyromegaly LYMPHATICS:  No cervical, inguinal adenopathy LUNGS:  Clear to auscultation bilaterally CHEST:  Unremarkable HEART:  PMI not displaced or sustained,S1 and S2 within normal limits, no S3, no S4, no clicks, no rubs, no murmurs ABD:  Flat, positive bowel sounds normal in frequency in pitch, no bruits, no rebound, no guarding, no midline pulsatile mass, no hepatomegaly, no splenomegaly EXT:  2 plus pulses throughout, no edema, no cyanosis no clubbing  EKG:  Atrial fibrillation, rate 92, RAD, intervals within normal limits, no acute ST-T wave   changes.  03/03/2012  ASSESSMENT AND PLAN  ATRIAL FIBRILLATION The patient is still in atrial fibrillation and the plan is cardioversion.  He has been on 3 weeks of therapeutic anticoagulation. We discussed the risks benefits and he agrees to proceed. We will bring him back for elective outpatient cardioversion. He will remain on the Xarelto for one month following this though I don't think he will need this long-term.  HTN The blood pressure is at target. No change in medications is indicated. We will continue with therapeutic lifestyle changes (TLC).   

## 2012-03-07 ENCOUNTER — Encounter (HOSPITAL_COMMUNITY): Payer: Self-pay | Admitting: Cardiology

## 2012-03-13 ENCOUNTER — Telehealth: Payer: Self-pay | Admitting: Gastroenterology

## 2012-03-13 MED ORDER — OMEPRAZOLE 20 MG PO CPDR: 40.0000 mg | DELAYED_RELEASE_CAPSULE | Freq: Two times a day (BID) | ORAL | Status: DC

## 2012-03-13 NOTE — Telephone Encounter (Signed)
Agree with omeprazole 40 mg bid  If symptoms persist will need to consider a BA esophagram and EGD

## 2012-03-13 NOTE — Telephone Encounter (Signed)
Patient having dysphagia mostly in the am.  He reports that he got "ckoked on a piece of sausage".  He had to vomit "to feel better".  Your last office note states that he was to increase his omeprazole 20 to BID, and if this did not help then he would need to increase to 40 mg BID.  I have sent a new rx.  He wants to try avoiding sausage and meats like and increase in omeprazole.  He will call back if this does not help.

## 2012-04-03 ENCOUNTER — Encounter: Payer: Self-pay | Admitting: Cardiology

## 2012-04-03 ENCOUNTER — Ambulatory Visit (INDEPENDENT_AMBULATORY_CARE_PROVIDER_SITE_OTHER): Payer: 59 | Admitting: Cardiology

## 2012-04-03 VITALS — BP 138/82 | HR 78 | Ht 74.0 in | Wt 248.0 lb

## 2012-04-03 DIAGNOSIS — I4891 Unspecified atrial fibrillation: Secondary | ICD-10-CM

## 2012-04-03 DIAGNOSIS — F17200 Nicotine dependence, unspecified, uncomplicated: Secondary | ICD-10-CM

## 2012-04-03 DIAGNOSIS — Z720 Tobacco use: Secondary | ICD-10-CM

## 2012-04-03 DIAGNOSIS — N179 Acute kidney failure, unspecified: Secondary | ICD-10-CM

## 2012-04-03 MED ORDER — TELMISARTAN 80 MG PO TABS: 80.0000 mg | ORAL_TABLET | Freq: Every day | ORAL | Status: DC

## 2012-04-03 MED ORDER — ASPIRIN EC 81 MG PO TBEC: 81.0000 mg | DELAYED_RELEASE_TABLET | Freq: Every day | ORAL | Status: DC

## 2012-04-03 NOTE — Patient Instructions (Addendum)
Please stop Xarelto and Metoprolol. Start Asa 81 mg a day and Micardis 80 mg a day Continue all other medications as listed.  Follow up with Dr Antoine Poche as needed.

## 2012-04-03 NOTE — Progress Notes (Signed)
   HPI The patient presents for followup of atrial fibrillation. He status post cardioversion. He had fibrillation in the context of nausea vomiting dehydration and possible GI issue. He has never really felt his fibrillation. He denies any chest pressure, neck or arm discomfort. He's not had any shortness of breath, PND or orthopnea. He doesn't notice any palpitations, presyncope or syncope.  No Known Allergies  Current Outpatient Prescriptions  Medication Sig Dispense Refill  . allopurinol (ZYLOPRIM) 300 MG tablet Take 300 mg by mouth daily.      Marland Kitchen amLODipine (NORVASC) 10 MG tablet Take 10 mg by mouth daily.      Marland Kitchen doxazosin (CARDURA) 4 MG tablet Take 4 mg by mouth daily.      . metoprolol tartrate (LOPRESSOR) 25 MG tablet Take 25 mg by mouth 2 (two) times daily.      Marland Kitchen omeprazole (PRILOSEC) 20 MG capsule Take 2 capsules (40 mg total) by mouth 2 (two) times daily.  60 capsule  11  . Rivaroxaban (XARELTO) 20 MG TABS Take 20 mg by mouth daily.      . simvastatin (ZOCOR) 10 MG tablet Take 10 mg by mouth at bedtime.       No current facility-administered medications for this visit.    Past Medical History  Diagnosis Date  . Hypertension   . GERD (gastroesophageal reflux disease)   . Gout   . A-fib     a. Dx 01/2012, CHADS2 = 1.  . Hiatal hernia   . Nephrolithiasis   . Cholelithiasis     "still got 2 small stones in there" (02/01/2012)  . Hypercholesteremia   . Arthritis     "fingers" (02/01/2012)  . Adenomatous colon polyp 06/2003  . Hemorrhoids     Past Surgical History  Procedure Laterality Date  . Inguinal hernia repair  ? 2008    "right" (02/01/2012)  . Cystoscopy with retrograde pyelogram, ureteroscopy and stent placement  1980's  . Cystoscopy w/ ureteroscopy w/ lithotripsy  1980's  . Vasectomy    . Cardioversion  03/06/2012    Procedure: CARDIOVERSION;  Surgeon: Laurey Morale, MD;  Location: [facility] ENDOSCOPY;  Service: Cardiovascular;  Laterality: N/A;    ROS:  As  stated in the HPI and negative for all other systems.  PHYSICAL EXAM BP 138/82  Pulse 78  Ht 6\' 2"  (1.88 m)  Wt 248 lb (112.492 kg)  BMI 31.83 kg/m2  SpO2 98% GENERAL:  Well appearing NECK:  No jugular venous distention, waveform within normal limits, carotid upstroke brisk and symmetric, no bruits, no thyromegaly LUNGS:  Clear to auscultation bilaterally CHEST:  Unremarkable HEART:  PMI not displaced or sustained,S1 and S2 within normal limits, no S3, no S4, no clicks, no rubs, no murmurs ABD:  Flat, positive bowel sounds normal in frequency in pitch, no bruits, no rebound, no guarding, no midline pulsatile mass, no hepatomegaly, no splenomegaly EXT:  2 plus pulses throughout, no edema, no cyanosis no clubbing  EKG:  Sinus rhythm, rate 74, axis within normal limits, intervals within normal limits, no acute ST-T wave changes. Incomplete right bundle branch block 04/03/2012  ASSESSMENT AND PLAN  ATRIAL FIBRILLATION The patient is now status post cardioversion seems to be maintaining sinus rhythm.  At this time he can stop his Xarelto and beta blocker. He will resume aspirin.  HTN As he stop his beta blocker he can resume his previous dose of my Micardis.

## 2012-04-07 ENCOUNTER — Encounter: Payer: Self-pay | Admitting: Gastroenterology

## 2012-04-07 ENCOUNTER — Telehealth: Payer: Self-pay | Admitting: Gastroenterology

## 2012-04-07 DIAGNOSIS — R1310 Dysphagia, unspecified: Secondary | ICD-10-CM

## 2012-04-07 NOTE — Telephone Encounter (Signed)
Patient had an episode of this weekend where he got a piece of sausage lodged "around the level of my sternum".  Per last phone note if 40 mg BID omeprazole did not improve his symptoms you were going to consider BS vs EGD.  Cardiology discontinued his xeralto last week.  Please advise next step

## 2012-04-07 NOTE — Telephone Encounter (Signed)
Patient is scheduled for EGD with possible dil on 04/18/12

## 2012-04-07 NOTE — Telephone Encounter (Signed)
If he is off all anticoagulants we can proceed with EGD, possible dilation.

## 2012-04-10 ENCOUNTER — Ambulatory Visit (AMBULATORY_SURGERY_CENTER): Payer: 59

## 2012-04-10 VITALS — Ht 74.0 in | Wt 250.0 lb

## 2012-04-10 DIAGNOSIS — R1310 Dysphagia, unspecified: Secondary | ICD-10-CM

## 2012-04-11 ENCOUNTER — Encounter: Payer: Self-pay | Admitting: Gastroenterology

## 2012-04-18 ENCOUNTER — Ambulatory Visit (AMBULATORY_SURGERY_CENTER): Payer: 59 | Admitting: Gastroenterology

## 2012-04-18 ENCOUNTER — Encounter: Payer: Self-pay | Admitting: Gastroenterology

## 2012-04-18 VITALS — BP 91/54 | HR 80 | Temp 99.1°F | Resp 28 | Ht 74.0 in | Wt 250.0 lb

## 2012-04-18 DIAGNOSIS — K222 Esophageal obstruction: Secondary | ICD-10-CM

## 2012-04-18 DIAGNOSIS — K219 Gastro-esophageal reflux disease without esophagitis: Secondary | ICD-10-CM

## 2012-04-18 DIAGNOSIS — R1310 Dysphagia, unspecified: Secondary | ICD-10-CM

## 2012-04-18 MED ORDER — SODIUM CHLORIDE 0.9 % IV SOLN: 500.0000 mL | INTRAVENOUS | Status: DC

## 2012-04-18 MED ORDER — OMEPRAZOLE 40 MG PO CPDR: 40.0000 mg | DELAYED_RELEASE_CAPSULE | Freq: Every day | ORAL | Status: DC

## 2012-04-18 NOTE — Progress Notes (Signed)
Patient did not experience any of the following events: a burn prior to discharge; a fall within the facility; wrong site/side/patient/procedure/implant event; or a hospital transfer or hospital admission upon discharge from the facility. (G8907) Patient did not have preoperative order for IV antibiotic SSI prophylaxis. (G8918)  

## 2012-04-18 NOTE — Patient Instructions (Addendum)
Impressions/Recommendations:  Stricture (handout given) dilated Large hiatal hernia (handout given)  Post dilation diet  YOU HAD AN ENDOSCOPIC PROCEDURE TODAY AT THE [facility]: Refer to the procedure report that was given to you for any specific questions about what was found during the examination.  If the procedure report does not answer your questions, please call your gastroenterologist to clarify.  If you requested that your care partner not be given the details of your procedure findings, then the procedure report has been included in a sealed envelope for you to review at your convenience later.  YOU SHOULD EXPECT: Some feelings of bloating in the abdomen. Passage of more gas than usual.  Walking can help get rid of the air that was put into your GI tract during the procedure and reduce the bloating. If you had a lower endoscopy (such as a colonoscopy or flexible sigmoidoscopy) you may notice spotting of blood in your stool or on the toilet paper. If you underwent a bowel prep for your procedure, then you may not have a normal bowel movement for a few days.  DIET: Your first meal following the procedure should be a light meal and then it is ok to progress to your normal diet.  A half-sandwich or bowl of soup is an example of a good first meal.  Heavy or fried foods are harder to digest and may make you feel nauseous or bloated.  Likewise meals heavy in dairy and vegetables can cause extra gas to form and this can also increase the bloating.  Drink plenty of fluids but you should avoid alcoholic beverages for 24 hours.  ACTIVITY: Your care partner should take you home directly after the procedure.  You should plan to take it easy, moving slowly for the rest of the day.  You can resume normal activity the day after the procedure however you should NOT DRIVE or use heavy machinery for 24 hours (because of the sedation medicines used during the test).    SYMPTOMS TO REPORT  IMMEDIATELY: A gastroenterologist can be reached at any hour.  During normal business hours, 8:30 AM to 5:00 PM Monday through Friday, call [phone].  After hours and on weekends, please call the GI answering service at [phone] who will take a message and have the physician on call contact you.   Following upper endoscopy (EGD)  Vomiting of blood or coffee ground material  New chest pain or pain under the shoulder blades  Painful or persistently difficult swallowing  New shortness of breath  Fever of 100F or higher  Black, tarry-looking stools  FOLLOW UP: If any biopsies were taken you will be contacted by phone or by letter within the next 1-3 weeks.  Call your gastroenterologist if you have not heard about the biopsies in 3 weeks.  Our staff will call the home number listed on your records the next business day following your procedure to check on you and address any questions or concerns that you may have at that time regarding the information given to you following your procedure. This is a courtesy call and so if there is no answer at the home number and we have not heard from you through the emergency physician on call, we will assume that you have returned to your regular daily activities without incident.  SIGNATURES/CONFIDENTIALITY: You and/or your care partner have signed paperwork which will be entered into your electronic medical record.  These signatures attest to the fact that that the  information above on your After Visit Summary has been reviewed and is understood.  Full responsibility of the confidentiality of this discharge information lies with you and/or your care-partner.

## 2012-04-18 NOTE — Progress Notes (Signed)
Called to room to assist during endoscopic procedure.  Patient ID and intended procedure confirmed with present staff. Received instructions for my participation in the procedure from the performing physician.  

## 2012-04-18 NOTE — Op Note (Signed)
[facility] [address] [place], [address]   ENDOSCOPY PROCEDURE REPORT PATIENT: James Santiago, James Santiago  MR#: [id number] BIRTHDATE: [birth year] , 61  yrs. old GENDER: Male ENDOSCOPIST: Meryl Dare, MD, [facility] PROCEDURE DATE:  04/18/2012 PROCEDURE:  EGD, diagnostic and Savary dilation of esophagus ASA CLASS:     Class IV INDICATIONS:  Dysphagia.   History of esophageal reflux. MEDICATIONS: MAC sedation, administered by CRNA and propofol (Diprivan) 250mg  IV TOPICAL ANESTHETIC: Cetacaine Spray DESCRIPTION OF PROCEDURE: After the risks benefits and alternatives of the procedure were thoroughly explained, informed consent was obtained.  The LB [id number] endoscope was introduced through the mouth and advanced to the second portion of the duodenum without limitations.  The instrument was slowly withdrawn as the mucosa was fully examined.  ESOPHAGUS: A stricture was found at the gastroesophageal junction. The stenosis was traversable with the endoscope. The esophagus was otherwise normal. STOMACH: The mucosa of the stomach appeared normal. DUODENUM: The duodenal mucosa showed no abnormalities in the bulb and second portion of the duodenum.  Retroflexed views revealed a 7 cm hiatal hernia.  A guidewire was placed and the scope was then withdrawn from the patient. 14, 15 and 16 mm Savary dilators were passed over the guidewire with minimal resistance noted and no heme note and the procedure completed.  COMPLICATIONS: There were no complications.  ENDOSCOPIC IMPRESSION: 1.   Stricture at the gastroesophageal junction; dilated 2.   Large hiatal hernia  RECOMMENDATIONS: 1.  Anti-reflux regimen long term 2.  Continue PPI long term: omeprazole 40 mg po bid 3.  Post dilation instructions   eSigned:  Meryl Dare, MD, [facility] 04/18/2012 1:53 PM   XB:[id number] Tanya Nones, MD

## 2012-04-21 ENCOUNTER — Telehealth: Payer: Self-pay | Admitting: *Deleted

## 2012-04-21 NOTE — Telephone Encounter (Signed)
  Follow up Call-  Call back number 04/18/2012  Post procedure Call Back phone  # [phone]  Permission to leave phone message Yes     Patient questions:  Do you have a fever, pain , or abdominal swelling? no Pain Score  0 *  Have you tolerated food without any problems? yes  Have you been able to return to your normal activities? yes  Do you have any questions about your discharge instructions: Diet   no Medications  no Follow up visit  no  Do you have questions or concerns about your Care? no  Actions: * If pain score is 4 or above: No action needed, pain <4.

## 2012-04-24 ENCOUNTER — Telehealth: Payer: Self-pay | Admitting: Gastroenterology

## 2012-04-24 DIAGNOSIS — K222 Esophageal obstruction: Secondary | ICD-10-CM

## 2012-04-24 DIAGNOSIS — K219 Gastro-esophageal reflux disease without esophagitis: Secondary | ICD-10-CM

## 2012-04-24 DIAGNOSIS — R1310 Dysphagia, unspecified: Secondary | ICD-10-CM

## 2012-04-25 MED ORDER — OMEPRAZOLE 40 MG PO CPDR: 40.0000 mg | DELAYED_RELEASE_CAPSULE | Freq: Two times a day (BID) | ORAL | Status: DC

## 2012-04-25 NOTE — Telephone Encounter (Signed)
Left message for patient to call back  

## 2012-04-25 NOTE — Telephone Encounter (Signed)
Patient c/o reflux and dysphagia this week to liquids.  He is asked to try changing the way he is taking his omeprazole.  Patient instructed to maintain an anti-reflux diet. Advised to avoid caffeine, mint, citrus foods/juices, tomatoes,  chocolate, NSAIDS/ASA products.  Instructed not to eat within 2 hours of exercise or bed, multiple small meals are better than 3 large meals.  Need to take PPI 30 minutes prior to 1st meal of the day and the second dose with the evening meal.  He will call me back if his symptoms don't improve for an appt with an extender or appt with Dr. Russella Dar.  He agrees to try changing how he takes omeprazole and dietary restrictions

## 2012-05-05 ENCOUNTER — Ambulatory Visit (INDEPENDENT_AMBULATORY_CARE_PROVIDER_SITE_OTHER): Payer: 59 | Admitting: Family Medicine

## 2012-05-05 ENCOUNTER — Encounter: Payer: Self-pay | Admitting: Family Medicine

## 2012-05-05 VITALS — BP 90/70 | HR 100 | Temp 98.7°F | Resp 18 | Wt 235.0 lb

## 2012-05-05 DIAGNOSIS — I951 Orthostatic hypotension: Secondary | ICD-10-CM

## 2012-05-05 NOTE — Progress Notes (Signed)
Subjective:     Patient ID: James Santiago, male   DOB: 01-09-1951, 62 y.o.   MRN: [id number]  HPI  Very pleasant 62 year old white male here today for dizziness.  James Santiago takes several blood pressure medications.  However he recently had an upper endoscopy.  This left the stomach unsettled.  He's not been able to tolerate really any food at all.  He is drinking very little.  His blood pressure has plummeted to 90-100 systolic over 60s to 70s diastolic.  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  He denies any chest palpitations, shortness of breath, or dyspnea on exertion. Past Medical History  Diagnosis Date  . Hypertension   . GERD (gastroesophageal reflux disease)   . Gout   . A-fib     a. Dx 01/2012, CHADS2 = 1.  . Hiatal hernia   . Nephrolithiasis   . Cholelithiasis     "still got 2 small stones in there" (02/01/2012)  . Hypercholesteremia   . Arthritis     "fingers" (02/01/2012)  . Adenomatous colon polyp 06/2003  . Hemorrhoids    Current Outpatient Prescriptions on File Prior to Visit  Medication Sig Dispense Refill  . allopurinol (ZYLOPRIM) 300 MG tablet Take 300 mg by mouth daily.      Marland Kitchen amLODipine (NORVASC) 10 MG tablet Take 10 mg by mouth daily.      Marland Kitchen aspirin EC 81 MG tablet Take 1 tablet (81 mg total) by mouth daily.  90 tablet  3  . doxazosin (CARDURA) 4 MG tablet Take 4 mg by mouth daily.      Marland Kitchen omeprazole (PRILOSEC) 40 MG capsule Take 1 capsule (40 mg total) by mouth 2 (two) times daily.  180 capsule  3  . simvastatin (ZOCOR) 10 MG tablet Take 10 mg by mouth at bedtime.      Marland Kitchen telmisartan (MICARDIS) 80 MG tablet Take 1 tablet (80 mg total) by mouth daily.  30 tablet  11   No current facility-administered medications on file prior to visit.    Review of Systems  Constitutional: Positive for activity change, appetite change, fatigue and unexpected weight change (down 10 lbs). Negative for fever, chills and diaphoresis.  HENT:  Negative.   Eyes: Negative.   Respiratory: Negative.   Cardiovascular: Negative.   Gastrointestinal: Positive for diarrhea. Negative for vomiting, abdominal pain, constipation and abdominal distention.  Endocrine: Negative.   Genitourinary: Negative.        Objective:   Physical Exam  Constitutional: He is oriented to person, place, and time. He appears well-developed and well-nourished.  HENT:  Head: Normocephalic and atraumatic.  Right Ear: External ear normal.  Left Ear: External ear normal.  Mouth/Throat: Oropharynx is clear and moist.  Eyes: Conjunctivae are normal. Pupils are equal, round, and reactive to light.  Neck: Normal range of motion. Neck supple. No thyromegaly present.  Cardiovascular: Normal rate, regular rhythm and normal heart sounds.   Pulmonary/Chest: Effort normal and breath sounds normal.  Abdominal: Soft. Bowel sounds are normal.  Neurological: He is alert and oriented to person, place, and time. He has normal reflexes. He displays normal reflexes. No cranial nerve deficit. He exhibits normal muscle tone. Coordination normal.       Assessment:     Orthostatic hypotension, Syncope Dehydration Weight loss    Plan:     Temporarily stop doxazosin and telmisartan.  Push fluids like Gatorade and increased dietary sodium.  Consume a bland diet.  Once blood  pressure returned to 140/90 or higher resume micardis.  Hold Cardura unless blood pressure remains above 140/90.  Check BMP due to his chronic kidney disease.

## 2012-05-05 NOTE — Patient Instructions (Signed)
Stop micardis and cardura (doxazosin).  Give 1 week. Push gatorade and chicken noodle soup. Monitor blood pressure daily.  When pressure gets back above 140/90, resume micardis first.  Continue monitoring bp daily and resume doxazosin if bp stays above 140/90.

## 2012-05-06 LAB — BASIC METABOLIC PANEL
CO2: 23 mEq/L (ref 19–32)
Glucose, Bld: 94 mg/dL (ref 70–99)
Potassium: 3.6 mEq/L (ref 3.5–5.3)
Sodium: 141 mEq/L (ref 135–145)

## 2012-05-12 ENCOUNTER — Telehealth: Payer: Self-pay | Admitting: Gastroenterology

## 2012-05-12 NOTE — Telephone Encounter (Signed)
Left message for patient to call back  

## 2012-05-13 NOTE — Telephone Encounter (Signed)
Pt states he had an egd 04/18/12 and soon after his PCP started him on a zpack and and asa a day. States he really thinks this messed up his stomach. States that now he can only eat small amounts and he has to be very careful what he eats. States after he eats he has all this burning and bubbling up in his throat and then it feels like he needs to burp but he can't. States he cannot drink a beer yet or any carbonated drinks. Pt scheduled to see Mike Gip PA tomorrow at 3:30pm. Late appt requested due to work schedule. Pt aware of appt date and time.

## 2012-05-14 ENCOUNTER — Ambulatory Visit (INDEPENDENT_AMBULATORY_CARE_PROVIDER_SITE_OTHER): Payer: 59 | Admitting: Physician Assistant

## 2012-05-14 ENCOUNTER — Encounter: Payer: Self-pay | Admitting: Physician Assistant

## 2012-05-14 VITALS — BP 132/84 | HR 88 | Ht 74.0 in | Wt 231.0 lb

## 2012-05-14 DIAGNOSIS — K219 Gastro-esophageal reflux disease without esophagitis: Secondary | ICD-10-CM

## 2012-05-14 DIAGNOSIS — R1013 Epigastric pain: Secondary | ICD-10-CM

## 2012-05-14 DIAGNOSIS — Z8719 Personal history of other diseases of the digestive system: Secondary | ICD-10-CM | POA: Insufficient documentation

## 2012-05-14 NOTE — Progress Notes (Signed)
Reviewed and agree with management plan.  Jafari Mckillop T. Silena Wyss, MD FACG 

## 2012-05-14 NOTE — Patient Instructions (Addendum)
Stay on Prilosec 40 mg twice daily. Stay on a bland diet for the next week. Call us back if symptoms reoccur.

## 2012-05-14 NOTE — Progress Notes (Signed)
Subjective:    Patient ID: James Santiago, male    DOB: 10-18-1950, 62 y.o.   MRN: [id number]  HPI James Santiago is a very nice 62 year old white male known to Dr. Russella Santiago.. He has history of atrial fibrillation and has undergone cardioversion, history of hypertension, gout, and colon polyps as well as cholelithiasis. He underwent upper endoscopy 04/18/2012 for complaints of dysphagia and reflux and was found to have a distal esophageal stricture which was Savary dilated to 16 mm. Also noted to have a large hiatal hernia. Patient states that the day after the endoscopy he was started on a Z-Pak for respiratory symptoms and also had an increase in his daily aspirin from 81 mg to 325 mg. With both of these new medicines he started having lots of indigestion belching burping" bubbling up" from his stomach to his chest . He was fairly miserable for several days. He started taking the 40 mg Prilosec twice daily stop the Z-Pak early and switched back to an 81 mg aspirin daily and says that he is feeling better. He has also been advised to go on a full liquid bland type diet and has been doing that over the past several days. He denies any fever or chills, no odynophagia or dysphagia and no abdominal pain. He says he that he does not have true dysphagia but has gas bubble back up from his stomach which stops in his chest is uncomfortable and relieved by belching. Nevertheless all of his symptoms of been much better over the past few days and he asks about advancing his diet.    Review of Systems  Constitutional: Positive for appetite change.  HENT: Negative.   Eyes: Negative.   Respiratory: Negative.   Cardiovascular: Negative.   Gastrointestinal: Negative.   Endocrine: Negative.   Genitourinary: Negative.   Musculoskeletal: Negative.   Allergic/Immunologic: Negative.   Neurological: Negative.   Hematological: Negative.   Psychiatric/Behavioral: Negative.    Outpatient Prescriptions Prior to Visit   Medication Sig Dispense Refill  . allopurinol (ZYLOPRIM) 300 MG tablet Take 300 mg by mouth daily.      Marland Kitchen amLODipine (NORVASC) 10 MG tablet Take 10 mg by mouth daily.      Marland Kitchen aspirin EC 81 MG tablet Take 1 tablet (81 mg total) by mouth daily.  90 tablet  3  . omeprazole (PRILOSEC) 40 MG capsule Take 1 capsule (40 mg total) by mouth 2 (two) times daily.  180 capsule  3  . simvastatin (ZOCOR) 10 MG tablet Take 10 mg by mouth at bedtime.      Marland Kitchen telmisartan (MICARDIS) 80 MG tablet Take 1 tablet (80 mg total) by mouth daily.  30 tablet  11  . doxazosin (CARDURA) 4 MG tablet Take 4 mg by mouth daily.       No facility-administered medications prior to visit.   No Known Allergies Patient Active Problem List  Diagnosis  . Atrial fibrillation with RVR  . Acute renal failure  . Epigastric pain  . Nausea & vomiting  . Tobacco abuse  . Hiatal hernia  . S/P dilatation of esophageal stricture   History  Substance Use Topics  . Smoking status: Former Smoker -- 0.50 packs/day for 12 years    Types: Cigarettes  . Smokeless tobacco: Current User    Types: Chew     Comment: 02/01/2012 smoked cigarettes "probably 30 yr ago."  Currently uses 1/2 pouch of chewing tobacco daily; offered cessation materials; pt declines.  . Alcohol Use: No  family history includes Breast cancer in his mother; Cancer in his brother; Diabetes in his mother; Heart disease in his father and mother; Kidney disease in his mother; and Lung cancer in his father.  There is no history of Colon cancer.     Objective:   Physical Exam well-developed white male in no acute distress, pleasant blood pressure 132/84 pulse 88 height 6 foot 2 weight 231. HEENT; nontraumatic normocephalic EOMI PERRLA sclera anicteric, Neck; supple no JVD, Cardiovascular; regular rate and rhythm with S1-S2 no murmur rub or gallop, Pulmonary; clear bilaterally, Abdomen ;soft nontender nondistended bowel sounds are active there is no palpable mass or  hepatosplenomegaly, Rectal ;not done, Ext;  no clubbing, cyanosis, or edema skin warm and dry, Psych; mood and affect normal and appropriate.        Assessment & Plan:  #61 62 year old white male with known large hiatal hernia, chronic GERD, and recent esophageal dilation with complaints of dyspepsia gas and belching all after starting a course of Zithromax and higher dose aspirin.  I suspect that his dyspeptic symptoms are secondary to medications-he may have developed some esophagitis or gastritis which is now healing. Patient does have previously documented cholelithiasis though his symptoms are not typical of biliary colic at this time. #2 history of atrial fibrillation status post cardioversion #3 history of hypertension #4 gout  Plan; patient will continue Prilosec 40 mg by mouth twice daily  Advance to regular bland diet over the next week or so then diet as tolerated Continue aspirin 81 mg by mouth daily Patient is advised to call back should any of his symptoms recur, and will be happy to reevaluate.

## 2012-08-06 ENCOUNTER — Other Ambulatory Visit: Payer: Self-pay | Admitting: Family Medicine

## 2012-09-17 ENCOUNTER — Encounter: Payer: Self-pay | Admitting: Family Medicine

## 2012-09-17 ENCOUNTER — Other Ambulatory Visit: Payer: Self-pay | Admitting: Family Medicine

## 2012-09-17 ENCOUNTER — Ambulatory Visit (INDEPENDENT_AMBULATORY_CARE_PROVIDER_SITE_OTHER): Payer: 59 | Admitting: Family Medicine

## 2012-09-17 VITALS — BP 128/84 | HR 88 | Temp 98.3°F | Resp 16 | Wt 232.0 lb

## 2012-09-17 DIAGNOSIS — K828 Other specified diseases of gallbladder: Secondary | ICD-10-CM

## 2012-09-17 MED ORDER — HYDROCODONE-ACETAMINOPHEN 10-325 MG PO TABS: 1.0000 | ORAL_TABLET | Freq: Three times a day (TID) | ORAL | Status: DC | PRN

## 2012-09-17 MED ORDER — SUCRALFATE 1 G PO TABS: 1.0000 g | ORAL_TABLET | Freq: Four times a day (QID) | ORAL | Status: DC

## 2012-09-17 NOTE — Progress Notes (Signed)
Subjective:    Patient ID: James Santiago, male    DOB: Feb 09, 1951, 62 y.o.   MRN: [id number]  HPI Patient had an EGD performed in March of 2014 which revealed an esophageal stricture that was dilated and a large hiatal hernia. Since that time he's been on omeprazole 40 mg by mouth twice a day. He states that he has not had any indigestion or reflux type symptoms. However over the last month he has had 3 episodes of sharp right upper quadrant abdominal pain that radiates to his chest. This usually occurs within an hour of eating. It lasts several hours. It does not radiate to his back. It has no exacerbating or relieving factors. He denies any melena or hematochezia. He is history of biliary dyskinesia. A HIDA scan obtained in 2006 showed an ejection fraction of 15%. He was found have cholelithiasis at that time on ultrasound. At that time he was no longer symptomatic and they deferred a cholecystectomy.  He now believes gallbladder is causing him problems and he would like it removed. He is requesting referral to general surgery. Past Medical History  Diagnosis Date  . Hypertension   . GERD (gastroesophageal reflux disease)   . Gout   . A-fib     a. Dx 01/2012, CHADS2 = 1.  . Hiatal hernia   . Nephrolithiasis   . Cholelithiasis     "still got 2 small stones in there" (02/01/2012)  . Hypercholesteremia   . Arthritis     "fingers" (02/01/2012)  . Adenomatous colon polyp 06/2003  . Hemorrhoids   . Esophageal stricture   . Biliary dyskinesia    Current Outpatient Prescriptions on File Prior to Visit  Medication Sig Dispense Refill  . allopurinol (ZYLOPRIM) 300 MG tablet Take 300 mg by mouth daily.      Marland Kitchen amLODipine (NORVASC) 10 MG tablet Take 10 mg by mouth daily.      Marland Kitchen aspirin EC 81 MG tablet Take 1 tablet (81 mg total) by mouth daily.  90 tablet  3  . omeprazole (PRILOSEC) 40 MG capsule Take 1 capsule (40 mg total) by mouth 2 (two) times daily.  180 capsule  3  . simvastatin (ZOCOR)  10 MG tablet Take 10 mg by mouth at bedtime.      Marland Kitchen telmisartan (MICARDIS) 80 MG tablet Take 1 tablet by mouth  every day  90 tablet  1   No current facility-administered medications on file prior to visit.   No Known Allergies History   Social History  . Marital Status: Married    Spouse Name: N/A    Number of Children: 1  . Years of Education: N/A   Occupational History  .  [employer]   Social History Main Topics  . Smoking status: Former Smoker -- 0.50 packs/day for 12 years    Types: Cigarettes  . Smokeless tobacco: Current User    Types: Chew     Comment: 02/01/2012 smoked cigarettes "probably 30 yr ago."  Currently uses 1/2 pouch of chewing tobacco daily; offered cessation materials; pt declines.  . Alcohol Use: No  . Drug Use: No  . Sexually Active: Not Currently   Other Topics Concern  . Not on file   Social History Narrative   Lives in [place] with wife.  They have one grown child and a grandson.  He works for the city of [employer], doing maintenance in Estate manager/land agent.      Review of Systems  All other systems  reviewed and are negative.       Objective:   Physical Exam  Vitals reviewed. Cardiovascular: Normal rate and regular rhythm.  Exam reveals no gallop and no friction rub.   No murmur heard. Pulmonary/Chest: Effort normal and breath sounds normal. No respiratory distress. He has no wheezes. He has no rales. He exhibits no tenderness.  Abdominal: Soft. Bowel sounds are normal. He exhibits no distension and no mass. There is no tenderness. There is no rebound and no guarding.          Assessment & Plan:  1. Biliary dyskinesia I believe his symptoms are most likely due to biliary colic. I will consult general surgery. I gave the patient a prescription for Norco 10/325 one by mouth every 8 hours when necessary pain. I provided him instructions on when to go to the emergency room if the pain persists. I will not repeat an ultrasound at this time and  I will defer that to surgery if they feel this necessary but I believe his symptoms are consistent with biliary colic. He is recently had an EGD which did show a hiatal hernia. Therefore I'm going to add Carafate 1 g by mouth qid. If the pain improves on this medication this may be some type of atypical GERD.  However if the pain does not improve I believe he would benefit from a cholecystectomy. - Ambulatory referral to General Surgery

## 2012-09-17 NOTE — Telephone Encounter (Signed)
Pt seen by Dr Tanya Nones this morning.  He ordered Sucralfate and sent to mail order pharmacy.  Pt went to local pharmacy and they did not have.  RX resent to local pharmacy.  Pt called and made aware.

## 2012-10-03 ENCOUNTER — Ambulatory Visit (INDEPENDENT_AMBULATORY_CARE_PROVIDER_SITE_OTHER): Payer: 59 | Admitting: General Surgery

## 2012-10-03 ENCOUNTER — Encounter (INDEPENDENT_AMBULATORY_CARE_PROVIDER_SITE_OTHER): Payer: Self-pay | Admitting: General Surgery

## 2012-10-03 VITALS — BP 160/98 | HR 86 | Temp 98.0°F | Resp 18 | Ht 74.0 in | Wt 232.0 lb

## 2012-10-03 DIAGNOSIS — K828 Other specified diseases of gallbladder: Secondary | ICD-10-CM

## 2012-10-03 LAB — CBC WITH DIFFERENTIAL/PLATELET
Basophils Absolute: 0 10*3/uL (ref 0.0–0.1)
Eosinophils Relative: 2 % (ref 0–5)
HCT: 44 % (ref 39.0–52.0)
Hemoglobin: 15.2 g/dL (ref 13.0–17.0)
Lymphocytes Relative: 22 % (ref 12–46)
MCHC: 34.5 g/dL (ref 30.0–36.0)
MCV: 89.6 fL (ref 78.0–100.0)
Monocytes Absolute: 0.4 10*3/uL (ref 0.1–1.0)
Monocytes Relative: 10 % (ref 3–12)
RDW: 14.8 % (ref 11.5–15.5)
WBC: 4.3 10*3/uL (ref 4.0–10.5)

## 2012-10-03 LAB — HEPATIC FUNCTION PANEL
ALT: 12 U/L (ref 0–53)
AST: 15 U/L (ref 0–37)
Alkaline Phosphatase: 107 U/L (ref 39–117)
Bilirubin, Direct: 0.3 mg/dL (ref 0.0–0.3)
Indirect Bilirubin: 1.1 mg/dL — ABNORMAL HIGH (ref 0.0–0.9)
Total Protein: 6.5 g/dL (ref 6.0–8.3)

## 2012-10-03 NOTE — Progress Notes (Signed)
Patient ID: James Santiago, male   DOB: 01-10-1951, 62 y.o.   MRN: [id number]  Chief Complaint  Patient presents with  . Establish Care    biliary     HPI James Santiago is a 62 y.o. male.  Referred by Dr Tanya Nones HPI 33 yom I know from prior repair of a right groin hernia from which he did well.  Since I last saw him he has had episode of afib that he was cardioverted for.  He has done well with that. He also has significant gerd with a egd recently.  He had a stricture dilated at that time.  He is getting better with respect to heartburn symptoms now on carafate.  He also has episodes of ruq pain and some chronic soreness that are not really helped with the carafate.  He has lost 30 lbs due to inability to eat well recently.  He has some nausea and has been forcing himself to throw up.  He has no fevers.  Pain will always go away on its own.  He has prior hida scan in 2006 that shows biliary dyskinesia but I cant find anything else in computer right now.He is up to date on his colonoscopy.  Past Medical History  Diagnosis Date  . Hypertension   . GERD (gastroesophageal reflux disease)   . Gout   . A-fib     a. Dx 01/2012, CHADS2 = 1.  . Hiatal hernia   . Nephrolithiasis   . Cholelithiasis     "still got 2 small stones in there" (02/01/2012)  . Hypercholesteremia   . Arthritis     "fingers" (02/01/2012)  . Adenomatous colon polyp 06/2003  . Hemorrhoids   . Esophageal stricture   . Biliary dyskinesia     Past Surgical History  Procedure Laterality Date  . Inguinal hernia repair  ? 2008    "right" (02/01/2012)  . Cystoscopy with retrograde pyelogram, ureteroscopy and stent placement  1980's  . Cystoscopy w/ ureteroscopy w/ lithotripsy  1980's  . Vasectomy    . Cardioversion  03/06/2012    Procedure: CARDIOVERSION;  Surgeon: Laurey Morale, MD;  Location: [facility] ENDOSCOPY;  Service: Cardiovascular;  Laterality: N/A;    Family History  Problem Relation Age of Onset  . Lung  cancer Father     died @ 56.  Also had PPM  . Heart disease Father   . Kidney disease Mother     died @ 36. Also had h/o CVA, breat cancer, diabetes, and PPM  . Breast cancer Mother   . Diabetes Mother   . Heart disease Mother   . Cancer Brother     esophagus  . Colon cancer Neg Hx     Social History History  Substance Use Topics  . Smoking status: Former Smoker -- 0.50 packs/day for 12 years    Types: Cigarettes  . Smokeless tobacco: Current User    Types: Chew     Comment: 02/01/2012 smoked cigarettes "probably 30 yr ago."  Currently uses 1/2 pouch of chewing tobacco daily; offered cessation materials; pt declines.  . Alcohol Use: No    No Known Allergies  Current Outpatient Prescriptions  Medication Sig Dispense Refill  . allopurinol (ZYLOPRIM) 300 MG tablet Take 300 mg by mouth daily.      Marland Kitchen amLODipine (NORVASC) 10 MG tablet Take 10 mg by mouth daily.      Marland Kitchen aspirin EC 81 MG tablet Take 1 tablet (81 mg total) by mouth daily.  90 tablet  3  . omeprazole (PRILOSEC) 40 MG capsule Take 1 capsule (40 mg total) by mouth 2 (two) times daily.  180 capsule  3  . simvastatin (ZOCOR) 10 MG tablet Take 10 mg by mouth at bedtime.      . sucralfate (CARAFATE) 1 G tablet Take 1 tablet (1 g total) by mouth 4 (four) times daily.  120 tablet  0  . telmisartan (MICARDIS) 80 MG tablet Take 1 tablet by mouth  every day  90 tablet  1  . HYDROcodone-acetaminophen (NORCO) 10-325 MG per tablet Take 1 tablet by mouth every 8 (eight) hours as needed for pain.  30 tablet  0   No current facility-administered medications for this visit.    Review of Systems Review of Systems  Constitutional: Positive for unexpected weight change. Negative for fever and chills.  HENT: Negative for hearing loss, congestion, sore throat, trouble swallowing and voice change.   Eyes: Negative for visual disturbance.  Respiratory: Negative for cough and wheezing.   Cardiovascular: Negative for chest pain, palpitations  and leg swelling.  Gastrointestinal: Negative for nausea, vomiting, abdominal pain, diarrhea, constipation, blood in stool, abdominal distention, anal bleeding and rectal pain.  Genitourinary: Negative for hematuria and difficulty urinating.  Musculoskeletal: Negative for arthralgias.  Skin: Negative for rash and wound.  Neurological: Negative for seizures, syncope, weakness and headaches.  Hematological: Negative for adenopathy. Does not bruise/bleed easily.  Psychiatric/Behavioral: Negative for confusion.    Blood pressure 160/98, pulse 86, temperature 98 F (36.7 C), resp. rate 18, height 6\' 2"  (1.88 m), weight 232 lb (105.235 kg).  Physical Exam Physical Exam  Vitals reviewed. Constitutional: He appears well-developed and well-nourished.  Eyes: No scleral icterus.  Cardiovascular: Normal rate, regular rhythm and normal heart sounds.   Pulmonary/Chest: Effort normal and breath sounds normal. He has no wheezes. He has no rales.  Abdominal: Soft. Bowel sounds are normal. He exhibits no distension. There is tenderness (very mild) in the right upper quadrant.  Lymphadenopathy:    He has no cervical adenopathy.    Data Reviewed 2006 NUCLEAR MEDICINE HIDA SCAN WITH GALLBLADDER EJECTION FRACTION:  Technique: Sequential abdominal images were obtained following intravenous injection of radiopharmaceutical. Sequential images were continued and the gallbladder ejection fraction was calculated.  Radiopharmaceutical: 5 mCi Tc-75m Choletec IV.  Comparison: None.  Following the intravenous administration of the radiopharmaceutical, there is prompt radiotracer uptake in a uniform fashion throughout the liver with clearance from the blood pool. Gallbladder activity is seen within the common duct and small bowel by 10 minutes. By 14 minutes, there is filling of the gallbladder.  After 60 minutes and after maximal distention of the gallbladder was achieved, patient received 2.3 mcg of CCK, IV.  Gallbladder ejection fraction was calculated at 30 minutes to be 17 percent and at 60 minutes 16 percent.  IMPRESSION:  1. Patent cystic duct without evidence for acute cholecystitis.  2. Decreased gallbladder ejection fraction at 16 percent consistent with mild to moderate biliary dyskinesis.   Assessment    GERD History biliary dyskinesia    Plan    I think his symptoms are likely multifactorial.  He is better after treatment for gerd right now but still has some symptoms that certainly can be related to his gallbladder.  I think best evaluation would be for new baseline lab tests and a ruq u/s as it has been a long time since any evaluation. I don't want to just take him to or without this. I  think there is good chance we will need to discuss lap chole but will get further eval first.   I discussed that eventual cholecystectomy may cure some of his symptoms.         Zaida Reiland 10/03/2012, 9:41 AM

## 2012-10-07 ENCOUNTER — Ambulatory Visit (HOSPITAL_COMMUNITY)
Admission: RE | Admit: 2012-10-07 | Discharge: 2012-10-07 | Disposition: A | Discharge status: Home / Self Care | Payer: 59 | Source: Ambulatory Visit | Attending: General Surgery | Admitting: General Surgery

## 2012-10-07 ENCOUNTER — Other Ambulatory Visit (INDEPENDENT_AMBULATORY_CARE_PROVIDER_SITE_OTHER): Payer: Self-pay | Admitting: General Surgery

## 2012-10-07 DIAGNOSIS — K828 Other specified diseases of gallbladder: Secondary | ICD-10-CM

## 2012-10-07 DIAGNOSIS — K8020 Calculus of gallbladder without cholecystitis without obstruction: Secondary | ICD-10-CM | POA: Insufficient documentation

## 2012-10-07 DIAGNOSIS — R109 Unspecified abdominal pain: Secondary | ICD-10-CM | POA: Insufficient documentation

## 2012-10-12 ENCOUNTER — Encounter (INDEPENDENT_AMBULATORY_CARE_PROVIDER_SITE_OTHER): Payer: Self-pay | Admitting: General Surgery

## 2012-10-17 ENCOUNTER — Encounter (INDEPENDENT_AMBULATORY_CARE_PROVIDER_SITE_OTHER): Payer: Self-pay | Admitting: General Surgery

## 2012-10-17 ENCOUNTER — Ambulatory Visit (INDEPENDENT_AMBULATORY_CARE_PROVIDER_SITE_OTHER): Payer: 59 | Admitting: General Surgery

## 2012-10-17 VITALS — BP 132/80 | HR 78 | Temp 98.0°F | Resp 18 | Wt 232.0 lb

## 2012-10-17 DIAGNOSIS — K8020 Calculus of gallbladder without cholecystitis without obstruction: Secondary | ICD-10-CM

## 2012-10-17 NOTE — Progress Notes (Signed)
Subjective:     Patient ID: James Santiago, male   DOB: 08-Oct-1950, 62 y.o.   MRN: [id number]  HPI 62 yom who I saw recently with multiple symptoms.  I sent him back for abdominal u/s and he does indeed have multiple gallstones.  He returns to discuss possible surgery.  Review of Systems    ABDOMINAL ULTRASOUND COMPLETE  Comparison: 11/08/2004  Findings:  Gallbladder: Multiple stones identified within the lumen of the  gallbladder. These measure up to 1.1 cm. The gallbladder wall  measures 1.8 mm in thickness. Negative for pericholecystic fluid.  No sonographic Murphy's sign.  Common Bile Duct: Within normal limits in caliber.  Liver: No focal mass lesion identified. Within normal limits in  parenchymal echogenicity.  IVC: Appears normal.  Pancreas: No abnormality identified.  Spleen: Within normal limits in size and echotexture.  Right kidney: Normal in size and parenchymal echogenicity. No  evidence of mass or hydronephrosis.  Left kidney: Normal in size and parenchymal echogenicity. No  evidence of mass or hydronephrosis.  Abdominal Aorta: No aneurysm identified.  IMPRESSION:  1. Gallstones.   Objective:   Physical Exam deferred    Assessment:     Symptomatic cholelithiasis     Plan:     I discussed the procedure in detail.  I do think that he will benefit from cholecystectomy with his ultrasound.  I told him this might not fix all of his symptoms as he does have some reflux also  We discussed the risks and benefits of a laparoscopic cholecystectomy and possible cholangiogram including, but not limited to bleeding, infection, injury to surrounding structures such as the intestine or liver, bile leak, retained gallstones, need to convert to an open procedure, prolonged diarrhea, blood clots such as  DVT, common bile duct injury, anesthesia risks, and possible need for additional procedures.  The likelihood of improvement in symptoms and return to the patient's normal status  is good. We discussed the typical post-operative recovery course.

## 2012-11-03 ENCOUNTER — Encounter (HOSPITAL_COMMUNITY): Payer: Self-pay | Admitting: Pharmacy Technician

## 2012-11-05 ENCOUNTER — Encounter (HOSPITAL_COMMUNITY)
Admission: RE | Admit: 2012-11-05 | Discharge: 2012-11-05 | Disposition: A | Discharge status: Home / Self Care | Payer: 59 | Source: Ambulatory Visit | Attending: General Surgery | Admitting: General Surgery

## 2012-11-05 ENCOUNTER — Encounter (HOSPITAL_COMMUNITY): Payer: Self-pay

## 2012-11-05 DIAGNOSIS — Z01812 Encounter for preprocedural laboratory examination: Secondary | ICD-10-CM | POA: Insufficient documentation

## 2012-11-05 DIAGNOSIS — Z01818 Encounter for other preprocedural examination: Secondary | ICD-10-CM | POA: Insufficient documentation

## 2012-11-05 LAB — CBC WITH DIFFERENTIAL/PLATELET
Basophils Absolute: 0.1 10*3/uL (ref 0.0–0.1)
Basophils Relative: 1 % (ref 0–1)
Eosinophils Absolute: 0.1 10*3/uL (ref 0.0–0.7)
Eosinophils Relative: 3 % (ref 0–5)
HCT: 45.4 % (ref 39.0–52.0)
Hemoglobin: 15.5 g/dL (ref 13.0–17.0)
Lymphocytes Relative: 28 % (ref 12–46)
MCH: 30.6 pg (ref 26.0–34.0)
MCHC: 34.1 g/dL (ref 30.0–36.0)
MCV: 89.5 fL (ref 78.0–100.0)
Monocytes Absolute: 0.5 10*3/uL (ref 0.1–1.0)
Monocytes Relative: 10 % (ref 3–12)
RDW: 14 % (ref 11.5–15.5)

## 2012-11-05 LAB — COMPREHENSIVE METABOLIC PANEL
AST: 15 U/L (ref 0–37)
Albumin: 4.2 g/dL (ref 3.5–5.2)
BUN: 12 mg/dL (ref 6–23)
CO2: 28 mEq/L (ref 19–32)
Calcium: 9.8 mg/dL (ref 8.4–10.5)
Creatinine, Ser: 1.33 mg/dL (ref 0.50–1.35)
GFR calc Af Amer: 66 mL/min — ABNORMAL LOW (ref >90)
GFR calc non Af Amer: 57 mL/min — ABNORMAL LOW (ref >90)
Total Bilirubin: 1 mg/dL (ref 0.3–1.2)

## 2012-11-05 LAB — LIPASE, BLOOD: Lipase: 45 U/L (ref 11–59)

## 2012-11-05 NOTE — Patient Instructions (Addendum)
James Santiago  11/05/2012   Your procedure is scheduled on:   11-13-2012  Report to [facility] at    0530    AM .  Call this number if you have problems the morning of surgery: [phone]  Or Presurgical Testing [phone](Alvin Rubano)   Do not eat food:After Midnight.    Take these medicines the morning of surgery with A SIP OF WATER: Allopurinol. Omeprazole. Norvasc.   Do not wear jewelry, make-up or nail polish.  Do not wear lotions, powders, or perfumes. You may wear deodorant.  Do not shave 12 hours prior to first CHG shower(legs and under arms).(face and neck okay.)  Do not bring valuables to the hospital.  Contacts, dentures or bridgework,body piercing,  may not be worn into surgery.  Leave suitcase in the car. After surgery it may be brought to your room.  For patients admitted to the hospital, checkout time is 11:00 AM the day of discharge.   Patients discharged the day of surgery will not be allowed to drive home. Must have responsible person with you x 24 hours once discharged.  Name and phone number of your driver:[name]-spouse [phone] cell   Special Instructions: CHG(Chlorhedine 4%-"Hibiclens","Betasept","Aplicare") Shower Use Special Wash: see special instructions.(avoid face and genitals)      Failure to follow these instructions may result in Cancellation of your surgery.   Patient signature_______________________________________________________

## 2012-11-05 NOTE — Pre-Procedure Instructions (Addendum)
11-05-12 EKG 2'14/ CXR 12'13 -Epic. 11-05-12 1620 labs viewable in Epic-fax sent to Dr. Dwain Sarna office. W. Kennon Portela

## 2012-11-05 NOTE — Progress Notes (Signed)
11-05-12 1620 labs viewable in Epic.

## 2012-11-12 NOTE — Anesthesia Preprocedure Evaluation (Addendum)
Anesthesia Evaluation  Patient identified by MRN, date of birth, ID band Patient awake    Reviewed: Allergy & Precautions, H&P , NPO status , Patient's Chart, lab work & pertinent test results  Airway Mallampati: II TM Distance: >3 FB Neck ROM: Full    Dental  (+) Edentulous Upper and Edentulous Lower   Pulmonary neg pulmonary ROS,  CXR: large hiatal hernia breath sounds clear to auscultation  Pulmonary exam normal       Cardiovascular Exercise Tolerance: Good hypertension, Pt. on medications - DOE + dysrhythmias Atrial Fibrillation Rhythm:Regular Rate:Normal  ECG 04-03-09-4: NSR   Neuro/Psych  Neuromuscular disease negative psych ROS   GI/Hepatic Neg liver ROS, hiatal hernia, GERD-  Medicated,  Endo/Other  negative endocrine ROS  Renal/GU Renal diseasenegative Renal ROS  negative genitourinary   Musculoskeletal negative musculoskeletal ROS (+)   Abdominal   Peds negative pediatric ROS (+)  Hematology negative hematology ROS (+)   Anesthesia Other Findings   Reproductive/Obstetrics negative OB ROS                         Anesthesia Physical Anesthesia Plan  ASA: III  Anesthesia Plan: General   Post-op Pain Management:    Induction: Intravenous  Airway Management Planned: Oral ETT  Additional Equipment:   Intra-op Plan:   Post-operative Plan: Extubation in OR  Informed Consent: I have reviewed the patients History and Physical, chart, labs and discussed the procedure including the risks, benefits and alternatives for the proposed anesthesia with the patient or authorized representative who has indicated his/her understanding and acceptance.   Dental advisory given  Plan Discussed with: CRNA  Anesthesia Plan Comments:        Anesthesia Quick Evaluation

## 2012-11-13 ENCOUNTER — Encounter (HOSPITAL_COMMUNITY)
Admission: RE | Disposition: A | Discharge status: Home / Self Care | Payer: Self-pay | Source: Ambulatory Visit | Attending: General Surgery

## 2012-11-13 ENCOUNTER — Encounter (HOSPITAL_COMMUNITY): Payer: Self-pay | Admitting: Anesthesiology

## 2012-11-13 ENCOUNTER — Encounter (HOSPITAL_COMMUNITY): Payer: Self-pay | Admitting: *Deleted

## 2012-11-13 ENCOUNTER — Ambulatory Visit (HOSPITAL_COMMUNITY)
Admission: RE | Admit: 2012-11-13 | Discharge: 2012-11-13 | Disposition: A | Discharge status: Home / Self Care | Payer: 59 | Source: Ambulatory Visit | Attending: General Surgery | Admitting: General Surgery

## 2012-11-13 ENCOUNTER — Ambulatory Visit (HOSPITAL_COMMUNITY): Payer: 59 | Admitting: Anesthesiology

## 2012-11-13 DIAGNOSIS — K8010 Calculus of gallbladder with chronic cholecystitis without obstruction: Secondary | ICD-10-CM | POA: Insufficient documentation

## 2012-11-13 DIAGNOSIS — K219 Gastro-esophageal reflux disease without esophagitis: Secondary | ICD-10-CM | POA: Insufficient documentation

## 2012-11-13 HISTORY — PX: CHOLECYSTECTOMY: SHX55

## 2012-11-13 SURGERY — LAPAROSCOPIC CHOLECYSTECTOMY
Anesthesia: General | Wound class: Clean Contaminated

## 2012-11-13 MED ORDER — FENTANYL CITRATE 0.05 MG/ML IJ SOLN
INTRAMUSCULAR | Status: DC | PRN
  Administered 2012-11-13: 100 ug via INTRAVENOUS
  Administered 2012-11-13 (×3): 50 ug via INTRAVENOUS

## 2012-11-13 MED ORDER — ACETAMINOPHEN 650 MG RE SUPP
650.0000 mg | RECTAL | Status: DC | PRN
  Filled 2012-11-13: qty 1

## 2012-11-13 MED ORDER — ONDANSETRON HCL 4 MG/2ML IJ SOLN: 4.0000 mg | Freq: Four times a day (QID) | INTRAMUSCULAR | Status: DC | PRN

## 2012-11-13 MED ORDER — OXYCODONE-ACETAMINOPHEN 10-325 MG PO TABS: 1.0000 | ORAL_TABLET | Freq: Four times a day (QID) | ORAL | Status: DC | PRN

## 2012-11-13 MED ORDER — GLYCOPYRROLATE 0.2 MG/ML IJ SOLN
INTRAMUSCULAR | Status: DC | PRN
  Administered 2012-11-13: 0.6 mg via INTRAVENOUS

## 2012-11-13 MED ORDER — MIDAZOLAM HCL 5 MG/5ML IJ SOLN
INTRAMUSCULAR | Status: DC | PRN
  Administered 2012-11-13: 2 mg via INTRAVENOUS

## 2012-11-13 MED ORDER — DEXTROSE 5 % IV SOLN
INTRAVENOUS | Status: AC
  Filled 2012-11-13: qty 1

## 2012-11-13 MED ORDER — HYDROMORPHONE HCL PF 1 MG/ML IJ SOLN
INTRAMUSCULAR | Status: AC
  Filled 2012-11-13: qty 1

## 2012-11-13 MED ORDER — BUPIVACAINE-EPINEPHRINE (PF) 0.25% -1:200000 IJ SOLN
INTRAMUSCULAR | Status: DC | PRN
  Administered 2012-11-13: 16 mL

## 2012-11-13 MED ORDER — HYDROMORPHONE HCL PF 1 MG/ML IJ SOLN
0.2500 mg | INTRAMUSCULAR | Status: DC | PRN
  Administered 2012-11-13 (×4): 0.5 mg via INTRAVENOUS

## 2012-11-13 MED ORDER — NEOSTIGMINE METHYLSULFATE 1 MG/ML IJ SOLN
INTRAMUSCULAR | Status: DC | PRN
  Administered 2012-11-13: 4 mg via INTRAVENOUS

## 2012-11-13 MED ORDER — ONDANSETRON HCL 4 MG/2ML IJ SOLN
INTRAMUSCULAR | Status: DC | PRN
  Administered 2012-11-13: 4 mg via INTRAVENOUS

## 2012-11-13 MED ORDER — OXYCODONE HCL 5 MG PO TABS
5.0000 mg | ORAL_TABLET | ORAL | Status: DC | PRN
  Administered 2012-11-13: 5 mg via ORAL
  Filled 2012-11-13: qty 1

## 2012-11-13 MED ORDER — ACETAMINOPHEN 325 MG PO TABS: 650.0000 mg | ORAL_TABLET | ORAL | Status: DC | PRN

## 2012-11-13 MED ORDER — LACTATED RINGERS IR SOLN
Status: DC | PRN
  Administered 2012-11-13: 1000 mL

## 2012-11-13 MED ORDER — DEXTROSE 5 % IV SOLN
2.0000 g | INTRAVENOUS | Status: AC
  Administered 2012-11-13: 2 g via INTRAVENOUS

## 2012-11-13 MED ORDER — MORPHINE SULFATE 10 MG/ML IJ SOLN: 2.0000 mg | INTRAMUSCULAR | Status: DC | PRN

## 2012-11-13 MED ORDER — LACTATED RINGERS IV SOLN: INTRAVENOUS | Status: DC

## 2012-11-13 MED ORDER — BUPIVACAINE-EPINEPHRINE PF 0.25-1:200000 % IJ SOLN
INTRAMUSCULAR | Status: AC
  Filled 2012-11-13: qty 30

## 2012-11-13 MED ORDER — SUCCINYLCHOLINE CHLORIDE 20 MG/ML IJ SOLN
INTRAMUSCULAR | Status: DC | PRN
  Administered 2012-11-13: 100 mg via INTRAVENOUS

## 2012-11-13 MED ORDER — PROPOFOL 10 MG/ML IV BOLUS
INTRAVENOUS | Status: DC | PRN
  Administered 2012-11-13: 180 mg via INTRAVENOUS

## 2012-11-13 MED ORDER — PROMETHAZINE HCL 25 MG/ML IJ SOLN: 6.2500 mg | INTRAMUSCULAR | Status: DC | PRN

## 2012-11-13 MED ORDER — ACETAMINOPHEN 10 MG/ML IV SOLN
1000.0000 mg | Freq: Once | INTRAVENOUS | Status: AC
  Administered 2012-11-13: 1000 mg via INTRAVENOUS
  Filled 2012-11-13: qty 100

## 2012-11-13 MED ORDER — LACTATED RINGERS IV SOLN
INTRAVENOUS | Status: DC | PRN
  Administered 2012-11-13: 07:00:00 via INTRAVENOUS

## 2012-11-13 MED ORDER — ROCURONIUM BROMIDE 100 MG/10ML IV SOLN
INTRAVENOUS | Status: DC | PRN
  Administered 2012-11-13: 40 mg via INTRAVENOUS
  Administered 2012-11-13: 10 mg via INTRAVENOUS

## 2012-11-13 SURGICAL SUPPLY — 37 items
APPLIER CLIP 5 13 M/L LIGAMAX5 (MISCELLANEOUS) ×2
APPLIER CLIP ROT 10 11.4 M/L (STAPLE)
BENZOIN TINCTURE PRP APPL 2/3 (GAUZE/BANDAGES/DRESSINGS) IMPLANT
CANISTER SUCTION 2500CC (MISCELLANEOUS) ×2 IMPLANT
CLIP APPLIE 5 13 M/L LIGAMAX5 (MISCELLANEOUS) ×1 IMPLANT
CLIP APPLIE ROT 10 11.4 M/L (STAPLE) IMPLANT
CLOTH BEACON ORANGE TIMEOUT ST (SAFETY) ×2 IMPLANT
COVER MAYO STAND STRL (DRAPES) ×2 IMPLANT
DECANTER SPIKE VIAL GLASS SM (MISCELLANEOUS) IMPLANT
DERMABOND ADVANCED (GAUZE/BANDAGES/DRESSINGS) ×1
DERMABOND ADVANCED .7 DNX12 (GAUZE/BANDAGES/DRESSINGS) ×1 IMPLANT
DRAPE C-ARM 42X120 X-RAY (DRAPES) ×2 IMPLANT
DRAPE LAPAROSCOPIC ABDOMINAL (DRAPES) ×2 IMPLANT
ELECT REM PT RETURN 9FT ADLT (ELECTROSURGICAL) ×2
ELECTRODE REM PT RTRN 9FT ADLT (ELECTROSURGICAL) ×1 IMPLANT
GLOVE BIO SURGEON STRL SZ7 (GLOVE) ×2 IMPLANT
GLOVE BIOGEL PI IND STRL 7.5 (GLOVE) ×6 IMPLANT
GLOVE BIOGEL PI INDICATOR 7.5 (GLOVE) ×6
GOWN PREVENTION PLUS LG XLONG (DISPOSABLE) IMPLANT
GOWN PREVENTION PLUS XLARGE (GOWN DISPOSABLE) IMPLANT
GOWN STRL REIN XL XLG (GOWN DISPOSABLE) ×6 IMPLANT
HEMOSTAT SNOW SURGICEL 2X4 (HEMOSTASIS) ×2 IMPLANT
KIT BASIN OR (CUSTOM PROCEDURE TRAY) ×2 IMPLANT
NS IRRIG 1000ML POUR BTL (IV SOLUTION) ×2 IMPLANT
POUCH SPECIMEN RETRIEVAL 10MM (ENDOMECHANICALS) ×2 IMPLANT
SET CHOLANGIOGRAPH MIX (MISCELLANEOUS) ×2 IMPLANT
SET IRRIG TUBING LAPAROSCOPIC (IRRIGATION / IRRIGATOR) ×2 IMPLANT
SLEEVE XCEL OPT CAN 5 100 (ENDOMECHANICALS) ×6 IMPLANT
SOLUTION ANTI FOG 6CC (MISCELLANEOUS) ×2 IMPLANT
STRIP CLOSURE SKIN 1/2X4 (GAUZE/BANDAGES/DRESSINGS) ×2 IMPLANT
SUT MNCRL AB 4-0 PS2 18 (SUTURE) ×2 IMPLANT
SUT VICRYL 0 UR6 27IN ABS (SUTURE) ×2 IMPLANT
TOWEL OR 17X26 10 PK STRL BLUE (TOWEL DISPOSABLE) ×2 IMPLANT
TRAY LAP CHOLE (CUSTOM PROCEDURE TRAY) ×2 IMPLANT
TROCAR XCEL BLUNT TIP 100MML (ENDOMECHANICALS) ×2 IMPLANT
TROCAR XCEL NON-BLD 11X100MML (ENDOMECHANICALS) IMPLANT
TUBING INSUFFLATION 10FT LAP (TUBING) ×2 IMPLANT

## 2012-11-13 NOTE — Preoperative (Signed)
Beta Blockers   Reason not to administer Beta Blockers:Not Applicable 

## 2012-11-13 NOTE — H&P (View-Only) (Signed)
Subjective:     Patient ID: James Santiago, male   DOB: [birth year], 62 y.o.   MRN: [id number]  HPI 10 yom who I saw recently with multiple symptoms.  I sent him back for abdominal u/s and he does indeed have multiple gallstones.  He returns to discuss possible surgery.  Review of Systems    ABDOMINAL ULTRASOUND COMPLETE  Comparison: 11/08/2004  Findings:  Gallbladder: Multiple stones identified within the lumen of the  gallbladder. These measure up to 1.1 cm. The gallbladder wall  measures 1.8 mm in thickness. Negative for pericholecystic fluid.  No sonographic Murphy's sign.  Common Bile Duct: Within normal limits in caliber.  Liver: No focal mass lesion identified. Within normal limits in  parenchymal echogenicity.  IVC: Appears normal.  Pancreas: No abnormality identified.  Spleen: Within normal limits in size and echotexture.  Right kidney: Normal in size and parenchymal echogenicity. No  evidence of mass or hydronephrosis.  Left kidney: Normal in size and parenchymal echogenicity. No  evidence of mass or hydronephrosis.  Abdominal Aorta: No aneurysm identified.  IMPRESSION:  1. Gallstones.   Objective:   Physical Exam deferred    Assessment:     Symptomatic cholelithiasis     Plan:     I discussed the procedure in detail.  I do think that he will benefit from cholecystectomy with his ultrasound.  I told him this might not fix all of his symptoms as he does have some reflux also  We discussed the risks and benefits of a laparoscopic cholecystectomy and possible cholangiogram including, but not limited to bleeding, infection, injury to surrounding structures such as the intestine or liver, bile leak, retained gallstones, need to convert to an open procedure, prolonged diarrhea, blood clots such as  DVT, common bile duct injury, anesthesia risks, and possible need for additional procedures.  The likelihood of improvement in symptoms and return to the patient's normal status  is good. We discussed the typical post-operative recovery course.

## 2012-11-13 NOTE — Op Note (Signed)
Preoperative diagnosis: Symptomatic cholelithiasis Postoperative diagnosis: Same as above Procedure: Laparoscopic cholecystectomy Surgeon: Dr. Harden Mo Anesthesia: Gen. Estimated blood loss: Minimal Complications: None Drains: None Specimens: Gallbladder and contents to pathology Disposition to recovery in stable condition Sponge needle count correct at completion  Indications: This is a 62 year old male who has known reflux but also has some symptoms are likely consistent with biliary colic. He underwent a HIDA scan in 2006 that was abnormal. He came and saw me recently with continued symptoms. I sent him for an ultrasound which did show cholelithiasis. I had a conversation with him that I do think some of the symptoms are referable to his gallbladder so we discussed a laparoscopic cholecystectomy for what appears to be symptomatic cholelithiasis.  Procedure: After informed consent was obtained the patient was taken to the operating room. He was given cefoxitin. Sequential compression devices were on his legs. He was then placed under general anesthesia without complication. His abdomen was prepped and draped in the standard sterile surgical fashion. A surgical timeout was performed.  I injected Marcaine below his umbilicus. I made a vertical incision. I carried this through the fascia and entered his peritoneum sharply. This was done without injury. I placed a 0 Vicryl pursestring suture to the fascia. I then inserted a Hassan trocar and insufflated the abdomen to 15 mm mercury pressure. I then inserted 3 5 mm trocars in the epigastrium and right upper quadrant under direct vision without complication. The gallbladder was then retracted cephalad. He had some adhesions from his omentum which were taken down bluntly. The gallbladder was then retracted cephalad and lateral. There was evidence of some chronic gallbladder disease in his triangle with some scarring. I was able to eventually obtain  the critical view of safety. I clipped the duct and divided it. His duct was viable and diminutive. I was able to see its insertion in the common bile duct. I also clipped both anterior and posterior branches of the artery separately and divided them. I then removed the gallbladder from the liver bed without difficulty. This was then placed in an Endo Catch bag and removed from the umbilicus. I then obtained hemostasis. I did place a piece of Surgicel snow at the bottom just to do to the blunt dissection from the scar tissue that was there. I then irrigated until this was clear. I then removed my Hassan trocar and tied this down. I placed an additional 0 Vicryl suture to completely obliterate the defect. The trocars were then removed and the abdomen was desufflated. Incisions were closed with 4-0 Monocryl, and Dermabond, and Steri-Strips. He tolerated this well was extubated and transferred to recovery stable.

## 2012-11-13 NOTE — Anesthesia Postprocedure Evaluation (Signed)
  Anesthesia Post-op Note  Patient: James Santiago  Procedure(s) Performed: Procedure(s) (LRB): LAPAROSCOPIC CHOLECYSTECTOMY (N/A)  Patient Location: PACU  Anesthesia Type: General  Level of Consciousness: awake and alert   Airway and Oxygen Therapy: Patient Spontanous Breathing  Post-op Pain: mild  Post-op Assessment: Post-op Vital signs reviewed, Patient's Cardiovascular Status Stable, Respiratory Function Stable, Patent Airway and No signs of Nausea or vomiting  Last Vitals:  Filed Vitals:   11/13/12 1016  BP: 118/79  Pulse: 58  Temp: 36.6 C  Resp: 15    Post-op Vital Signs: stable   Complications: No apparent anesthesia complications

## 2012-11-13 NOTE — Progress Notes (Signed)
Pt up ambulated to bathroom, pt voided large amount of urine.  Pt then ambulated around short stay unit without difficulty.

## 2012-11-13 NOTE — Transfer of Care (Signed)
Immediate Anesthesia Transfer of Care Note  Patient: James Santiago  Procedure(s) Performed: Procedure(s): LAPAROSCOPIC CHOLECYSTECTOMY (N/A)  Patient Location: PACU  Anesthesia Type:General  Level of Consciousness: awake, alert , oriented and patient cooperative  Airway & Oxygen Therapy: Patient Spontanous Breathing and Patient connected to face mask oxygen  Post-op Assessment: Report given to PACU RN and Post -op Vital signs reviewed and stable  Post vital signs: Reviewed and stable  Complications: No apparent anesthesia complications

## 2012-11-13 NOTE — Interval H&P Note (Signed)
History and Physical Interval Note:  11/13/2012 7:03 AM  James Santiago  has presented today for surgery, with the diagnosis of cholelithasis   The various methods of treatment have been discussed with the patient and family. After consideration of risks, benefits and other options for treatment, the patient has consented to  Procedure(s): LAPAROSCOPIC CHOLECYSTECTOMY WITH INTRAOPERATIVE CHOLANGIOGRAM (N/A) as a surgical intervention .  The patient's history has been reviewed, patient examined, no change in status, stable for surgery.  I have reviewed the patient's chart and labs.  Questions were answered to the patient's satisfaction.     James Santiago

## 2012-11-14 ENCOUNTER — Encounter (HOSPITAL_COMMUNITY): Payer: Self-pay | Admitting: General Surgery

## 2012-11-28 ENCOUNTER — Ambulatory Visit (INDEPENDENT_AMBULATORY_CARE_PROVIDER_SITE_OTHER): Payer: 59 | Admitting: General Surgery

## 2012-11-28 ENCOUNTER — Encounter (INDEPENDENT_AMBULATORY_CARE_PROVIDER_SITE_OTHER): Payer: Self-pay | Admitting: General Surgery

## 2012-11-28 ENCOUNTER — Encounter (INDEPENDENT_AMBULATORY_CARE_PROVIDER_SITE_OTHER): Payer: Self-pay

## 2012-11-28 VITALS — BP 128/72 | HR 68 | Temp 98.0°F | Resp 14 | Ht 74.0 in | Wt 227.0 lb

## 2012-11-28 DIAGNOSIS — Z09 Encounter for follow-up examination after completed treatment for conditions other than malignant neoplasm: Secondary | ICD-10-CM

## 2012-11-28 NOTE — Progress Notes (Signed)
Subjective:     Patient ID: James Santiago, male   DOB: 1950-07-12, 62 y.o.   MRN: [id number]  HPI 62 yom s/p lap chole with pathology showing chronic cholecystitis and cholelithiasis.  He has some reflux symptoms but is otherwise doing well.  No complaints. Not having any bowel issues  Review of Systems     Objective:   Physical Exam Healing incisions without infection    Assessment:     S/p lap chole     Plan:     He can return to full normal activity and will return into the work next week. He is going to eat smaller more frequent meals and continue treatment for his reflux. I'll see him back as needed.

## 2012-12-18 ENCOUNTER — Other Ambulatory Visit: Payer: Self-pay

## 2012-12-28 ENCOUNTER — Other Ambulatory Visit: Payer: Self-pay | Admitting: Family Medicine

## 2013-01-28 ENCOUNTER — Other Ambulatory Visit: Payer: Self-pay | Admitting: Family Medicine

## 2013-01-28 ENCOUNTER — Encounter: Payer: Self-pay | Admitting: Family Medicine

## 2013-02-20 ENCOUNTER — Encounter: Payer: Self-pay | Admitting: Family Medicine

## 2013-02-20 ENCOUNTER — Ambulatory Visit (INDEPENDENT_AMBULATORY_CARE_PROVIDER_SITE_OTHER): Payer: 59 | Admitting: Family Medicine

## 2013-02-20 VITALS — BP 138/90 | HR 78 | Temp 98.3°F | Resp 18 | Ht 75.0 in | Wt 226.0 lb

## 2013-02-20 DIAGNOSIS — M109 Gout, unspecified: Secondary | ICD-10-CM

## 2013-02-20 DIAGNOSIS — R1013 Epigastric pain: Secondary | ICD-10-CM

## 2013-02-20 DIAGNOSIS — I10 Essential (primary) hypertension: Secondary | ICD-10-CM

## 2013-02-20 DIAGNOSIS — K3189 Other diseases of stomach and duodenum: Secondary | ICD-10-CM

## 2013-02-20 MED ORDER — TELMISARTAN 80 MG PO TABS: 80.0000 mg | ORAL_TABLET | Freq: Every morning | ORAL | Status: DC

## 2013-02-20 MED ORDER — ALLOPURINOL 300 MG PO TABS: ORAL_TABLET | ORAL | Status: DC

## 2013-02-20 MED ORDER — AMLODIPINE BESYLATE 10 MG PO TABS: ORAL_TABLET | ORAL | Status: DC

## 2013-02-20 MED ORDER — SIMVASTATIN 10 MG PO TABS: ORAL_TABLET | ORAL | Status: DC

## 2013-02-20 NOTE — Progress Notes (Signed)
Subjective:    Patient ID: James Santiago, male    DOB: [birth year], 63 y.o.   MRN: [id number]  HPI 09/17/12 Patient had an EGD performed in March of 2014 which revealed an esophageal stricture that was dilated and a large hiatal hernia. Since that time he's been on omeprazole 40 mg by mouth twice a day. He states that he has not had any indigestion or reflux type symptoms. However over the last month he has had 3 episodes of sharp right upper quadrant abdominal pain that radiates to his chest. This usually occurs within an hour of eating. It lasts several hours. It does not radiate to his back. It has no exacerbating or relieving factors. He denies any melena or hematochezia. He is history of biliary dyskinesia. A HIDA scan obtained in 2006 showed an ejection fraction of 15%. He was found have cholelithiasis at that time on ultrasound. At that time he was no longer symptomatic and they deferred a cholecystectomy.  He now believes gallbladder is causing him problems and he would like it removed. He is requesting referral to general surgery.  At that time, my plan was: 1. Biliary dyskinesia I believe his symptoms are most likely due to biliary colic. I will consult general surgery. I gave the patient a prescription for Norco 10/325 one by mouth every 8 hours when necessary pain. I provided him instructions on when to go to the emergency room if the pain persists. I will not repeat an ultrasound at this time and I will defer that to surgery if they feel this necessary but I believe his symptoms are consistent with biliary colic. He is recently had an EGD which did show a hiatal hernia. Therefore I'm going to add Carafate 1 g by mouth qid. If the pain improves on this medication this may be some type of atypical GERD.  However if the pain does not improve I believe he would benefit from a cholecystectomy. - Ambulatory referral to General Surgery  02/20/13 Patient is here today for recheck of his blood  pressure and cholesterol. He is also following up for his, pain. He underwent cholecystectomy in the beginning of October. He continues to have increased bloating and epigastric area. He also has occasional epigastric discomfort and pain. He has daily reflux. His symptoms are not controlled on omeprazole. The surgery helped his symptoms somewhat but he continues to have persistent dyspepsia. He denies any chest pain or shortness of breath or dyspnea on exertion. His blood pressure is usually well controlled at home ranging 120 130/80-90. He is overdue for fasting lab work. Past Medical History  Diagnosis Date  . Hypertension   . GERD (gastroesophageal reflux disease)   . Gout   . Hiatal hernia   . Cholelithiasis     "still got 2 small stones in there" (02/01/2012)  . Hypercholesteremia   . Arthritis     "fingers" (02/01/2012)  . Adenomatous colon polyp 06/2003  . Hemorrhoids   . Esophageal stricture   . Biliary dyskinesia   . A-fib     a. Dx 01/2012, CHADS2 = 1.-[facility] in past.  . Nephrolithiasis     "was told kidney function at 65%"- hx. kidney stones   Current Outpatient Prescriptions on File Prior to Visit  Medication Sig Dispense Refill  . aspirin EC 81 MG tablet Take 1 tablet (81 mg total) by mouth daily.  90 tablet  3  . omeprazole (PRILOSEC) 40 MG capsule Take 1 capsule (40 mg total) by  mouth 2 (two) times daily.  180 capsule  3   No current facility-administered medications on file prior to visit.   No Known Allergies History   Social History  . Marital Status: Married    Spouse Name: N/A    Number of Children: 1  . Years of Education: N/A   Occupational History  .  Unemployed   Social History Main Topics  . Smoking status: Former Smoker -- 0.50 packs/day for 12 years    Types: Cigarettes  . Smokeless tobacco: Current User    Types: Chew     Comment: 02/01/2012 smoked cigarettes "probably 30 yr ago."  Currently uses 1/2 pouch of chewing tobacco daily; offered  cessation materials; pt declines.  . Alcohol Use: Yes     Comment: occ. beer  . Drug Use: No  . Sexual Activity: Not Currently   Other Topics Concern  . Not on file   Social History Narrative   Lives in [place] with wife.  They have one grown child and a grandson.  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      Review of Systems  All other systems reviewed and are negative.       Objective:   Physical Exam  Vitals reviewed. Cardiovascular: Normal rate and regular rhythm.  Exam reveals no gallop and no friction rub.   No murmur heard. Pulmonary/Chest: Effort normal and breath sounds normal. No respiratory distress. He has no wheezes. He has no rales. He exhibits no tenderness.  Abdominal: Soft. Bowel sounds are normal. He exhibits no distension and no mass. There is no tenderness. There is no rebound and no guarding.          Assessment & Plan:  1. HTN (hypertension) Return fasting for a fasting lipid panel. Goal LDL is less than 130. I recommended decreasing sodium consumption. Otherwise blood pressure is adequately controlled. - COMPLETE METABOLIC PANEL WITH GFR - Lipid panel  2. Gout Check uric acid level. Goal uric acid level is less than 6. Otherwise continue allopurinol 300 mg by mouth daily - Uric acid  3. Dyspepsia Discontinue omeprazole and start the patient on dexilant 60 mg poqday.  I gave the patient 3 weeks samples. Recheck in 3 weeks.

## 2013-02-23 ENCOUNTER — Other Ambulatory Visit: Payer: 59

## 2013-02-23 LAB — URIC ACID: Uric Acid, Serum: 3.8 mg/dL — ABNORMAL LOW (ref 4.0–7.8)

## 2013-02-23 LAB — COMPLETE METABOLIC PANEL WITH GFR
ALK PHOS: 165 U/L — AB (ref 39–117)
ALT: 16 U/L (ref 0–53)
AST: 15 U/L (ref 0–37)
Albumin: 4.3 g/dL (ref 3.5–5.2)
BILIRUBIN TOTAL: 1.4 mg/dL — AB (ref 0.3–1.2)
BUN: 10 mg/dL (ref 6–23)
CHLORIDE: 103 meq/L (ref 96–112)
CO2: 27 mEq/L (ref 19–32)
Calcium: 9.5 mg/dL (ref 8.4–10.5)
Creat: 1.4 mg/dL — ABNORMAL HIGH (ref 0.50–1.35)
GFR, EST AFRICAN AMERICAN: 62 mL/min
GFR, EST NON AFRICAN AMERICAN: 54 mL/min — AB
Glucose, Bld: 94 mg/dL (ref 70–99)
POTASSIUM: 4.2 meq/L (ref 3.5–5.3)
Sodium: 141 mEq/L (ref 135–145)
TOTAL PROTEIN: 6.8 g/dL (ref 6.0–8.3)

## 2013-02-23 LAB — LIPID PANEL
CHOL/HDL RATIO: 2.9 ratio
CHOLESTEROL: 125 mg/dL (ref 0–200)
HDL: 43 mg/dL (ref >39)
LDL Cholesterol: 64 mg/dL (ref 0–99)
TRIGLYCERIDES: 89 mg/dL (ref <150)
VLDL: 18 mg/dL (ref 0–40)

## 2013-03-03 ENCOUNTER — Encounter: Payer: Self-pay | Admitting: Family Medicine

## 2013-03-03 NOTE — Telephone Encounter (Signed)
This encounter was created in error - please disregard.

## 2013-03-03 NOTE — Telephone Encounter (Signed)
Pt had blood work done not long ago and he has an apt on 2-6 for a CPE and he is wanting to know if he needs to come back in for his blood work  Call back number is [phone]

## 2013-03-09 ENCOUNTER — Telehealth: Payer: Self-pay | Admitting: Family Medicine

## 2013-03-09 NOTE — Telephone Encounter (Signed)
Bexalant seems to be working little better than what he was taking Call back number is [phone]

## 2013-03-09 NOTE — Telephone Encounter (Signed)
Does he need a prescription for this or is this just FYI?

## 2013-03-20 ENCOUNTER — Ambulatory Visit (INDEPENDENT_AMBULATORY_CARE_PROVIDER_SITE_OTHER): Payer: 59 | Admitting: Family Medicine

## 2013-03-20 ENCOUNTER — Encounter: Payer: Self-pay | Admitting: Family Medicine

## 2013-03-20 VITALS — BP 126/84 | HR 76 | Temp 97.8°F | Resp 18 | Ht 75.0 in | Wt 230.0 lb

## 2013-03-20 DIAGNOSIS — M109 Gout, unspecified: Secondary | ICD-10-CM

## 2013-03-20 DIAGNOSIS — Z0000 Encounter for general adult medical examination without abnormal findings: Secondary | ICD-10-CM

## 2013-03-20 LAB — TSH: TSH: 2.158 u[IU]/mL (ref 0.350–4.500)

## 2013-03-20 LAB — PSA: PSA: 0.57 ng/mL (ref <=4.00)

## 2013-03-20 MED ORDER — DEXLANSOPRAZOLE 60 MG PO CPDR: 60.0000 mg | DELAYED_RELEASE_CAPSULE | Freq: Every day | ORAL | Status: DC

## 2013-03-20 NOTE — Progress Notes (Signed)
Subjective:    Patient ID: James Santiago, male    DOB: 04-25-1950, 63 y.o.   MRN: [id number]  HPI 09/17/12 Patient had an EGD performed in March of 2014 which revealed an esophageal stricture that was dilated and a large hiatal hernia. Since that time he's been on omeprazole 40 mg by mouth twice a day. He states that he has not had any indigestion or reflux type symptoms. However over the last month he has had 3 episodes of sharp right upper quadrant abdominal pain that radiates to his chest. This usually occurs within an hour of eating. It lasts several hours. It does not radiate to his back. It has no exacerbating or relieving factors. He denies any melena or hematochezia. He is history of biliary dyskinesia. A HIDA scan obtained in 2006 showed an ejection fraction of 15%. He was found have cholelithiasis at that time on ultrasound. At that time he was no longer symptomatic and they deferred a cholecystectomy.  He now believes gallbladder is causing him problems and he would like it removed. He is requesting referral to general surgery.  At that time, my plan was: 1. Biliary dyskinesia I believe his symptoms are most likely due to biliary colic. I will consult general surgery. I gave the patient a prescription for Norco 10/325 one by mouth every 8 hours when necessary pain. I provided him instructions on when to go to the emergency room if the pain persists. I will not repeat an ultrasound at this time and I will defer that to surgery if they feel this necessary but I believe his symptoms are consistent with biliary colic. He is recently had an EGD which did show a hiatal hernia. Therefore I'm going to add Carafate 1 g by mouth qid. If the pain improves on this medication this may be some type of atypical GERD.  However if the pain does not improve I believe he would benefit from a cholecystectomy. - Ambulatory referral to General Surgery  02/20/13 Patient is here today for recheck of his blood  pressure and cholesterol. He is also following up for his, pain. He underwent cholecystectomy in the beginning of October. He continues to have increased bloating and epigastric area. He also has occasional epigastric discomfort and pain. He has daily reflux. His symptoms are not controlled on omeprazole. The surgery helped his symptoms somewhat but he continues to have persistent dyspepsia. He denies any chest pain or shortness of breath or dyspnea on exertion. His blood pressure is usually well controlled at home ranging 120 130/80-90. He is overdue for fasting lab work.  At that time, my plan was: 1. HTN (hypertension) Return fasting for a fasting lipid panel. Goal LDL is less than 130. I recommended decreasing sodium consumption. Otherwise blood pressure is adequately controlled. - COMPLETE METABOLIC PANEL WITH GFR - Lipid panel  2. Gout Check uric acid level. Goal uric acid level is less than 6. Otherwise continue allopurinol 300 mg by mouth daily - Uric acid  3. Dyspepsia Discontinue omeprazole and start the patient on dexilant 60 mg poqday.  I gave the patient 3 weeks samples. Recheck in 3 weeks.  03/20/13 Patient is here today for his complete physical exam.  He is overdue for a prostate examination and PSA and a TSH. He also likely uric acid level checked for his history of gout. His most recent labwork as listed below: Office Visit on 02/20/2013  Component Date Value Range Status  . Sodium 02/23/2013 141  135 -  145 mEq/L Final  . Potassium 02/23/2013 4.2  3.5 - 5.3 mEq/L Final  . Chloride 02/23/2013 103  96 - 112 mEq/L Final  . CO2 02/23/2013 27  19 - 32 mEq/L Final  . Glucose, Bld 02/23/2013 94  70 - 99 mg/dL Final  . BUN 02/23/2013 10  6 - 23 mg/dL Final  . Creat 02/23/2013 1.40* 0.50 - 1.35 mg/dL Final  . Total Bilirubin 02/23/2013 1.4* 0.3 - 1.2 mg/dL Final  . Alkaline Phosphatase 02/23/2013 165* 39 - 117 U/L Final  . AST 02/23/2013 15  0 - 37 U/L Final  . ALT 02/23/2013 16  0  - 53 U/L Final  . Total Protein 02/23/2013 6.8  6.0 - 8.3 g/dL Final  . Albumin 02/23/2013 4.3  3.5 - 5.2 g/dL Final  . Calcium 02/23/2013 9.5  8.4 - 10.5 mg/dL Final  . GFR, Est African American 02/23/2013 62   Final  . GFR, Est Non African American 02/23/2013 54*  Final   Comment:                            The estimated GFR is a calculation valid for adults (>=43 years old)                          that uses the CKD-EPI algorithm to adjust for age and sex. It is                            not to be used for children, pregnant women, hospitalized patients,                             patients on dialysis, or with rapidly changing kidney function.                          According to the NKDEP, eGFR >89 is normal, 60-89 shows mild                          impairment, 30-59 shows moderate impairment, 15-29 shows severe                          impairment and <15 is ESRD.                             Marland Kitchen Cholesterol 02/23/2013 125  0 - 200 mg/dL Final   Comment: ATP III Classification:                                < 200        mg/dL        Desirable                               200 - 239     mg/dL        Borderline High                               >= 240  mg/dL        High                             . Triglycerides 02/23/2013 89  <150 mg/dL Final  . HDL 02/23/2013 43  >39 mg/dL Final  . Total CHOL/HDL Ratio 02/23/2013 2.9   Final  . VLDL 02/23/2013 18  0 - 40 mg/dL Final  . LDL Cholesterol 02/23/2013 64  0 - 99 mg/dL Final   Comment:                            Total Cholesterol/HDL Ratio:CHD Risk                                                 Coronary Heart Disease Risk Table                                                                 Men       Women                                   1/2 Average Risk              3.4        3.3                                       Average Risk              5.0        4.4                                    2X Average Risk               9.6        7.1                                    3X Average Risk             23.4       11.0                          Use the calculated Patient Ratio above and the CHD Risk table                           to determine the patient's CHD Risk.                          ATP III Classification (LDL):                                <  100        mg/dL         Optimal                               100 - 129     mg/dL         Near or Above Optimal                               130 - 159     mg/dL         Borderline High                               160 - 189     mg/dL         High                                > 190        mg/dL         Very High                             . Uric Acid, Serum 02/23/2013 3.8* 4.0 - 7.8 mg/dL Final   As his last colonoscopy was in 2010 and was significant for polyps. He is due again this year. His gastroenterologist usually schedules that. He is no longer on anticoagulations for his age fibrillation beyond a baby aspirin. I think it would be safe for him to stop baby aspirin one week prior the colonoscopy.  Patient also states that his abdominal discomfort, belching, and bloating is much improved since he started taking dexilant.  He had one episode of vomiting after over indulgin at [place].  Otherwise he's been doing well for over a month would like to continue the medication. He has no other complaints. His most recent lab work is excellent.  Past Medical History  Diagnosis Date  . Hypertension   . GERD (gastroesophageal reflux disease)   . Gout   . Hiatal hernia   . Cholelithiasis     "still got 2 small stones in there" (02/01/2012)  . Hypercholesteremia   . Arthritis     "fingers" (02/01/2012)  . Adenomatous colon polyp 06/2003  . Hemorrhoids   . Esophageal stricture   . Biliary dyskinesia   . A-fib     a. Dx 01/2012, CHADS2 = 1.-[facility] in past.  . Nephrolithiasis     "was told kidney function at 65%"- hx. kidney stones   Current Outpatient  Prescriptions on File Prior to Visit  Medication Sig Dispense Refill  . allopurinol (ZYLOPRIM) 300 MG tablet Take 1 tablet by mouth  every day  90 tablet  3  . amLODipine (NORVASC) 10 MG tablet Take 1 tablet by mouth  every day  90 tablet  3  . aspirin EC 81 MG tablet Take 1 tablet (81 mg total) by mouth daily.  90 tablet  3  . simvastatin (ZOCOR) 10 MG tablet Take 1 tablet by mouth  every night at bedtime  90 tablet  3  . telmisartan (MICARDIS) 80 MG tablet Take 1 tablet (80 mg total) by mouth every morning.  90 tablet  3   No current facility-administered  medications on file prior to visit.   No Known Allergies History   Social History  . Marital Status: Married    Spouse Name: N/A    Number of Children: 1  . Years of Education: N/A   Occupational History  .  Unemployed   Social History Main Topics  . Smoking status: Former Smoker -- 0.50 packs/day for 12 years    Types: Cigarettes  . Smokeless tobacco: Current User    Types: Chew     Comment: 02/01/2012 smoked cigarettes "probably 30 yr ago."  Currently uses 1/2 pouch of chewing tobacco daily; offered cessation materials; pt declines.  . Alcohol Use: Yes     Comment: occ. beer  . Drug Use: No  . Sexual Activity: Not Currently   Other Topics Concern  . Not on file   Social History Narrative   Lives in [place] with wife.  They have one grown child and a grandson.  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   Family History  Problem Relation Age of Onset  . Lung cancer Father     died @ 74.  Also had PPM  . Heart disease Father   . Kidney disease Mother     died @ 36. Also had h/o CVA, breat cancer, diabetes, and PPM  . Breast cancer Mother   . Diabetes Mother   . Heart disease Mother   . Cancer Brother     esophagus  . Colon cancer Neg Hx       Review of Systems  All other systems reviewed and are negative.       Objective:   Physical Exam  Vitals reviewed. Constitutional: He is  oriented to person, place, and time. He appears well-developed and well-nourished. No distress.  HENT:  Head: Normocephalic and atraumatic.  Right Ear: External ear normal.  Left Ear: External ear normal.  Nose: Nose normal.  Mouth/Throat: Oropharynx is clear and moist. No oropharyngeal exudate.  Eyes: Conjunctivae and EOM are normal. Pupils are equal, round, and reactive to light. Right eye exhibits no discharge. Left eye exhibits no discharge. No scleral icterus.  Neck: Normal range of motion. Neck supple. No JVD present. No tracheal deviation present. No thyromegaly present.  Cardiovascular: Normal rate, regular rhythm, normal heart sounds and intact distal pulses.  Exam reveals no gallop and no friction rub.   No murmur heard. Pulmonary/Chest: Effort normal and breath sounds normal. No stridor. No respiratory distress. He has no wheezes. He has no rales. He exhibits no tenderness.  Abdominal: Soft. Bowel sounds are normal. He exhibits no distension and no mass. There is no tenderness. There is no rebound and no guarding.  Genitourinary: Rectum normal and prostate normal.  Musculoskeletal: Normal range of motion. He exhibits no edema and no tenderness.  Lymphadenopathy:    He has no cervical adenopathy.  Neurological: He is alert and oriented to person, place, and time. He has normal reflexes. He displays normal reflexes. No cranial nerve deficit. He exhibits normal muscle tone. Coordination normal.  Skin: Skin is warm. No rash noted. He is not diaphoretic. No erythema. No pallor.  Psychiatric: He has a normal mood and affect. His behavior is normal. Judgment and thought content normal.          Assessment & Plan:  1. Routine general medical examination at a health care facility Patient's cancer screening is up to date. His prostate exam is normal. I will check a PSA. I also  recommended a colonoscopy this year. The patient like his gastroenterologist call and schedule that. He did not  have the gastroenterologist on call this year I will schedule it for him. His immunizations are up-to-date. His lab work at his last visit was excellent. We will continue Dexilant as he has failed omeprazole. - PSA - TSH  2. Gout - Uric acid

## 2013-03-20 NOTE — Addendum Note (Signed)
Addended by: WRAY, [place] on: 03/20/2013 10:47 AM   Modules accepted: Orders

## 2013-04-01 ENCOUNTER — Other Ambulatory Visit: Payer: Self-pay | Admitting: Gastroenterology

## 2013-04-04 ENCOUNTER — Other Ambulatory Visit: Payer: Self-pay | Admitting: Family Medicine

## 2013-05-12 ENCOUNTER — Other Ambulatory Visit: Payer: Self-pay | Admitting: Gastroenterology

## 2013-05-12 NOTE — Telephone Encounter (Signed)
NEEDS OFFICE VISIT FOR ANY FURTHER REFILLS! 

## 2013-05-21 ENCOUNTER — Encounter: Payer: Self-pay | Admitting: Gastroenterology

## 2013-07-21 ENCOUNTER — Other Ambulatory Visit: Payer: Self-pay | Admitting: Gastroenterology

## 2013-08-25 ENCOUNTER — Telehealth: Payer: Self-pay | Admitting: Family Medicine

## 2013-08-25 MED ORDER — ZOSTER VACCINE LIVE 19400 UNT/0.65ML ~~LOC~~ SOLR: 0.6500 mL | Freq: Once | SUBCUTANEOUS | Status: DC

## 2013-08-25 NOTE — Telephone Encounter (Signed)
Message copied by Alyson Locket on Tue Aug 25, 2013 11:20 AM ------      Message from: Devoria Glassing      Created: Tue Aug 25, 2013  9:32 AM       [phone]      Patient is calling to let us know that his insurance is going to cover his shingles shot and would like to know if we can go ahead and order that for him  ------

## 2013-08-25 NOTE — Telephone Encounter (Signed)
Shingles shot sent to [facility]

## 2013-09-15 ENCOUNTER — Encounter: Payer: Self-pay | Admitting: Family Medicine

## 2013-09-15 ENCOUNTER — Ambulatory Visit (INDEPENDENT_AMBULATORY_CARE_PROVIDER_SITE_OTHER): Payer: 59 | Admitting: Family Medicine

## 2013-09-15 VITALS — BP 128/74 | HR 80 | Temp 97.0°F | Resp 16 | Ht 75.0 in | Wt 234.0 lb

## 2013-09-15 DIAGNOSIS — M25569 Pain in unspecified knee: Secondary | ICD-10-CM

## 2013-09-15 DIAGNOSIS — M25561 Pain in right knee: Secondary | ICD-10-CM

## 2013-09-15 MED ORDER — PREDNISONE 20 MG PO TABS: ORAL_TABLET | ORAL | Status: DC

## 2013-09-15 MED ORDER — ZOSTER VACCINE LIVE 19400 UNT/0.65ML ~~LOC~~ SOLR: 0.6500 mL | Freq: Once | SUBCUTANEOUS | Status: DC

## 2013-09-15 NOTE — Progress Notes (Signed)
Subjective:    Patient ID: James Santiago, male    DOB: October 30, 1950, 63 y.o.   MRN: [id number]  HPI Patient has had moderate to severe pain in his right knee over the medial joint line for approximately one week. He aggravated by climbing on ladders and painting mouldings and trim around his house.  He has also been bending and working on his knees painting baseboards.  He denies any erythema or effusion. He denies any injury. The medial joint line is tender. It is extremely stiff in the mornings. Due to his chronic kidney disease he is unable to take NSAIDs. Tylenol is not helping with the pain Past Medical History  Diagnosis Date  . Hypertension   . GERD (gastroesophageal reflux disease)   . Gout   . Hiatal hernia   . Cholelithiasis     "still got 2 small stones in there" (02/01/2012)  . Hypercholesteremia   . Arthritis     "fingers" (02/01/2012)  . Adenomatous colon polyp 06/2003  . Hemorrhoids   . Esophageal stricture   . Biliary dyskinesia   . A-fib     a. Dx 01/2012, CHADS2 = 1.-[facility] in past.  . Nephrolithiasis     "was told kidney function at 65%"- hx. kidney stones   Current Outpatient Prescriptions on File Prior to Visit  Medication Sig Dispense Refill  . allopurinol (ZYLOPRIM) 300 MG tablet Take 1 tablet by mouth  every day  90 tablet  3  . amLODipine (NORVASC) 10 MG tablet Take 1 tablet by mouth  every day  90 tablet  3  . aspirin EC 81 MG tablet Take 1 tablet (81 mg total) by mouth daily.  90 tablet  3  . dexlansoprazole (DEXILANT) 60 MG capsule Take 1 capsule (60 mg total) by mouth daily.  30 capsule  11  . omeprazole (PRILOSEC) 40 MG capsule Take 1 capsule by mouth  twice daily  180 capsule  0  . simvastatin (ZOCOR) 10 MG tablet Take 1 tablet by mouth  every night at bedtime  90 tablet  3  . telmisartan (MICARDIS) 80 MG tablet Take 1 tablet (80 mg total) by mouth every morning.  90 tablet  3   No current facility-administered medications on file prior to  visit.   No Known Allergies History   Social History  . Marital Status: Married    Spouse Name: N/A    Number of Children: 1  . Years of Education: N/A   Occupational History  .  Unemployed   Social History Main Topics  . Smoking status: Former Smoker -- 0.50 packs/day for 12 years    Types: Cigarettes  . Smokeless tobacco: Current User    Types: Chew     Comment: 02/01/2012 smoked cigarettes "probably 30 yr ago."  Currently uses 1/2 pouch of chewing tobacco daily; offered cessation materials; pt declines.  . Alcohol Use: Yes     Comment: occ. beer  . Drug Use: No  . Sexual Activity: Not Currently   Other Topics Concern  . Not on file   Social History Narrative   Lives in [place] with wife.  They have one grown child and a grandson.  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      Review of Systems  All other systems reviewed and are negative.      Objective:   Physical Exam  Vitals reviewed. Cardiovascular: Normal rate and regular rhythm.   Pulmonary/Chest: Effort  normal and breath sounds normal.  Musculoskeletal:       Right knee: He exhibits decreased range of motion. He exhibits no swelling, no effusion, no erythema, normal alignment, no LCL laxity, normal patellar mobility, no bony tenderness, normal meniscus and no MCL laxity. Tenderness found. Medial joint line tenderness noted. No lateral joint line, no MCL and no LCL tenderness noted.          Assessment & Plan:  1. Right medial knee pain I believe this is secondary to osteoarthritis which is been aggravated by increasing activity recently. Patient does not want a cortisone injection. He cannot take NSAIDs. He does not want to try narcotic pain medication. He would like to try prednisone taper pack. Nephrology the patient prednisone taper pack. The pain is not better I recommend a cortisone injection as well as an x-ray of the right knee. - predniSONE (DELTASONE) 20 MG tablet; 3  tabs poqday 1-2, 2 tabs poqday 3-4, 1 tab poqday 5-6  Dispense: 12 tablet; Refill: 0

## 2013-09-16 ENCOUNTER — Telehealth: Payer: Self-pay | Admitting: Family Medicine

## 2013-09-16 NOTE — Telephone Encounter (Signed)
[phone]   PT is wanting [place] to know he went to [facility] on [address] yesterday and got his shingle shot

## 2013-09-16 NOTE — Telephone Encounter (Signed)
Updated in chart

## 2013-11-30 ENCOUNTER — Encounter: Payer: Self-pay | Admitting: Family Medicine

## 2013-11-30 ENCOUNTER — Ambulatory Visit (INDEPENDENT_AMBULATORY_CARE_PROVIDER_SITE_OTHER): Payer: 59 | Admitting: Family Medicine

## 2013-11-30 VITALS — BP 128/80 | HR 72 | Temp 97.9°F | Resp 18 | Ht 75.0 in | Wt 244.0 lb

## 2013-11-30 DIAGNOSIS — J208 Acute bronchitis due to other specified organisms: Secondary | ICD-10-CM

## 2013-11-30 MED ORDER — DEXLANSOPRAZOLE 60 MG PO CPDR: 60.0000 mg | DELAYED_RELEASE_CAPSULE | Freq: Every day | ORAL | Status: DC

## 2013-11-30 MED ORDER — AZITHROMYCIN 250 MG PO TABS: ORAL_TABLET | ORAL | Status: DC

## 2013-11-30 NOTE — Progress Notes (Signed)
Subjective:    Patient ID: James Santiago, male    DOB: 11/02/1950, 63 y.o.   MRN: [id number]  HPI Patient reports a nonproductive cough for more than 3 weeks.  He denies any rhinorrhea. He denies any sore throat. He denies any symptoms of allergies or acid reflux. He does report subjective fevers. He denies any chest pain. He denies any dyspnea on exertion. His wife and grandkids it had a similar cough but there is improved. Patient reports congestion in his chest which "will not break up."   Past Medical History  Diagnosis Date  . Hypertension   . GERD (gastroesophageal reflux disease)   . Gout   . Hiatal hernia   . Cholelithiasis     "still got 2 small stones in there" (02/01/2012)  . Hypercholesteremia   . Arthritis     "fingers" (02/01/2012)  . Adenomatous colon polyp 06/2003  . Hemorrhoids   . Esophageal stricture   . Biliary dyskinesia   . A-fib     a. Dx 01/2012, CHADS2 = 1.-[facility] in past.  . Nephrolithiasis     "was told kidney function at 65%"- hx. kidney stones   Past Surgical History  Procedure Laterality Date  . Inguinal hernia repair  ? 2008    "right" (02/01/2012)  . Cystoscopy with retrograde pyelogram, ureteroscopy and stent placement  1980's  . Cystoscopy w/ ureteroscopy w/ lithotripsy  1980's  . Vasectomy    . Cardioversion  03/06/2012    Procedure: CARDIOVERSION;  Surgeon: Larey Dresser, MD;  Location: [facility] ENDOSCOPY;  Service: Cardiovascular;  Laterality: N/A;  . Esophageal dilation    . Cholecystectomy N/A 11/13/2012    Procedure: LAPAROSCOPIC CHOLECYSTECTOMY;  Surgeon: Rolm Bookbinder, MD;  Location: [facility] ORS;  Service: General;  Laterality: N/A;   Current Outpatient Prescriptions on File Prior to Visit  Medication Sig Dispense Refill  . allopurinol (ZYLOPRIM) 300 MG tablet Take 1 tablet by mouth  every day  90 tablet  3  . amLODipine (NORVASC) 10 MG tablet Take 1 tablet by mouth  every day  90 tablet  3  . aspirin EC 81 MG tablet Take 1 tablet  (81 mg total) by mouth daily.  90 tablet  3  . omeprazole (PRILOSEC) 40 MG capsule Take 1 capsule by mouth  twice daily  180 capsule  0  . simvastatin (ZOCOR) 10 MG tablet Take 1 tablet by mouth  every night at bedtime  90 tablet  3  . telmisartan (MICARDIS) 80 MG tablet Take 1 tablet (80 mg total) by mouth every morning.  90 tablet  3  . zoster vaccine live, PF, (ZOSTAVAX) 60737 UNT/0.65ML injection Inject 19,400 Units into the skin once.  1 each  0   No current facility-administered medications on file prior to visit.   No Known Allergies History   Social History  . Marital Status: Married    Spouse Name: N/A    Number of Children: 1  . Years of Education: N/A   Occupational History  .  Unemployed   Social History Main Topics  . Smoking status: Former Smoker -- 0.50 packs/day for 12 years    Types: Cigarettes  . Smokeless tobacco: Current User    Types: Chew     Comment: 02/01/2012 smoked cigarettes "probably 30 yr ago."  Currently uses 1/2 pouch of chewing tobacco daily; offered cessation materials; pt declines.  . Alcohol Use: Yes     Comment: occ. beer  . Drug Use: No  .  Sexual Activity: Not Currently   Other Topics Concern  . Not on file   Social History Narrative   Lives in [place] with wife.  They have one grown child and a grandson.  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      Review of Systems  All other systems reviewed and are negative.      Objective:   Physical Exam  Vitals reviewed. Constitutional: He appears well-developed and well-nourished.  HENT:  Right Ear: External ear normal.  Left Ear: External ear normal.  Nose: Nose normal.  Mouth/Throat: Oropharynx is clear and moist. No oropharyngeal exudate.  Eyes: Conjunctivae are normal. No scleral icterus.  Neck: Neck supple.  Cardiovascular: Normal rate, regular rhythm and normal heart sounds.   No murmur heard. Pulmonary/Chest: Effort normal. He has wheezes. He has  no rales.  Lymphadenopathy:    He has no cervical adenopathy.   patient has faint expiratory wheezes and rhonchorous breath sounds        Assessment & Plan:  Acute bronchitis due to other specified organisms - Plan: azithromycin (ZITHROMAX) 250 MG tablet  Begin Zithromax 500 mg by mouth daily one, 250 mg by mouth daily 2-5. Patient can use Mucinex as needed for cough. Patient does not want anything for cough at night because the cough is not bothering him at night.

## 2013-12-25 ENCOUNTER — Other Ambulatory Visit: Payer: Self-pay | Admitting: Family Medicine

## 2014-01-31 ENCOUNTER — Other Ambulatory Visit: Payer: Self-pay | Admitting: Family Medicine

## 2014-02-06 ENCOUNTER — Other Ambulatory Visit: Payer: Self-pay | Admitting: Family Medicine

## 2014-02-07 NOTE — Telephone Encounter (Signed)
Refill appropriate and filled per protocol. 

## 2014-04-08 ENCOUNTER — Encounter: Payer: Self-pay | Admitting: Gastroenterology

## 2014-05-31 ENCOUNTER — Ambulatory Visit (AMBULATORY_SURGERY_CENTER): Payer: Self-pay | Admitting: *Deleted

## 2014-05-31 VITALS — Ht 74.0 in | Wt 245.0 lb

## 2014-05-31 DIAGNOSIS — Z86010 Personal history of colonic polyps: Secondary | ICD-10-CM

## 2014-05-31 MED ORDER — NA SULFATE-K SULFATE-MG SULF 17.5-3.13-1.6 GM/177ML PO SOLN: 1.0000 | Freq: Once | ORAL | Status: DC

## 2014-05-31 NOTE — Progress Notes (Signed)
No egg or soy allergy No problems with past sedation No home 02 No diet pills emmi declined

## 2014-06-14 ENCOUNTER — Ambulatory Visit (AMBULATORY_SURGERY_CENTER): Payer: 59 | Admitting: Gastroenterology

## 2014-06-14 ENCOUNTER — Encounter: Payer: Self-pay | Admitting: Gastroenterology

## 2014-06-14 VITALS — BP 137/94 | HR 64 | Temp 97.7°F | Resp 21 | Ht 74.0 in | Wt 245.0 lb

## 2014-06-14 DIAGNOSIS — Z86010 Personal history of colonic polyps: Secondary | ICD-10-CM

## 2014-06-14 MED ORDER — SODIUM CHLORIDE 0.9 % IV SOLN: 500.0000 mL | INTRAVENOUS | Status: DC

## 2014-06-14 NOTE — Progress Notes (Signed)
To recovery, report to [place], RN, VSS. 

## 2014-06-14 NOTE — Op Note (Signed)
[place]  [address]. [place], [address]   COLONOSCOPY PROCEDURE REPORT  PATIENT: James Santiago, James Santiago  MR#: [id number] BIRTHDATE: Mar 10, 1950 , 51  yrs. old GENDER: male ENDOSCOPIST: Ladene Artist, MD, [facility] PROCEDURE DATE:  06/14/2014 PROCEDURE:   Colonoscopy, surveillance First Screening Colonoscopy - Avg.  risk and is 50 yrs.  old or older - No.  Prior Negative Screening - Now for repeat screening. N/A  History of Adenoma - Now for follow-up colonoscopy & has been > or = to 3 yrs.  Yes hx of adenoma.  Has been 3 or more years since last colonoscopy. ASA CLASS:   Class II INDICATIONS:Surveillance due to prior colonic neoplasia and PH Colon Adenoma. MEDICATIONS: Monitored anesthesia care and Propofol 230 mg IV DESCRIPTION OF PROCEDURE:   After the risks benefits and alternatives of the procedure were thoroughly explained, informed consent was obtained.  The digital rectal exam revealed no abnormalities of the rectum.   The [id number]  endoscope was introduced through the anus and advanced to the cecum, which was identified by both the appendix and ileocecal valve. No adverse events experienced.   The quality of the prep was excellent. (Suprep was used)  The instrument was then slowly withdrawn as the colon was fully examined.    COLON FINDINGS: There was mild diverticulosis noted in the sigmoid colon.   The colonic mucosa appeared normal at the splenic flexure, in the transverse colon, rectum, descending colon, at the ileocecal valve, cecum, hepatic flexure, and in the ascending colon. Retroflexed views revealed internal Grade I hemorrhoids. The time to cecum = 1.5 Withdrawal time = 8.2   The scope was withdrawn and the procedure completed. COMPLICATIONS: There were no immediate complications.  ENDOSCOPIC IMPRESSION: 1.   Mild diverticulosis in the sigmoid colon 2.   The colonic mucosa otherwise appeared normal 3.   Grade I internal  hemorrhoids  RECOMMENDATIONS: 1.  High fiber diet with liberal fluid intake. 2.  Repeat Colonoscopy in 5 years.  eSigned:  Ladene Artist, MD, [facility] 06/14/2014 9:27 AM

## 2014-06-14 NOTE — Patient Instructions (Signed)
YOU HAD AN ENDOSCOPIC PROCEDURE TODAY AT [facility]:   Refer to the procedure report that was given to you for any specific questions about what was found during the examination.  If the procedure report does not answer your questions, please call your gastroenterologist to clarify.  If you requested that your care partner not be given the details of your procedure findings, then the procedure report has been included in a sealed envelope for you to review at your convenience later.  YOU SHOULD EXPECT: Some feelings of bloating in the abdomen. Passage of more gas than usual.  Walking can help get rid of the air that was put into your GI tract during the procedure and reduce the bloating. If you had a lower endoscopy (such as a colonoscopy or flexible sigmoidoscopy) you may notice spotting of blood in your stool or on the toilet paper. If you underwent a bowel prep for your procedure, you may not have a normal bowel movement for a few days.  Please Note:  You might notice some irritation and congestion in your nose or some drainage.  This is from the oxygen used during your procedure.  There is no need for concern and it should clear up in a day or so.  SYMPTOMS TO REPORT IMMEDIATELY:   Following lower endoscopy (colonoscopy or flexible sigmoidoscopy):  Excessive amounts of blood in the stool  Significant tenderness or worsening of abdominal pains  Swelling of the abdomen that is new, acute  Fever of 100F or higher   For urgent or emergent issues, a gastroenterologist can be reached at any hour by calling [phone].   DIET: Your first meal following the procedure should be a small meal and then it is ok to progress to your normal diet. Heavy or fried foods are harder to digest and may make you feel nauseous or bloated.  Likewise, meals heavy in dairy and vegetables can increase bloating.  Drink plenty of fluids but you should avoid alcoholic beverages for 24  hours.  ACTIVITY:  You should plan to take it easy for the rest of today and you should NOT DRIVE or use heavy machinery until tomorrow (because of the sedation medicines used during the test).    FOLLOW UP: Our staff will call the number listed on your records the next business day following your procedure to check on you and address any questions or concerns that you may have regarding the information given to you following your procedure. If we do not reach you, we will leave a message.  However, if you are feeling well and you are not experiencing any problems, there is no need to return our call.  We will assume that you have returned to your regular daily activities without incident.  If any biopsies were taken you will be contacted by phone or by letter within the next 1-3 weeks.  Please call us at [phone] if you have not heard about the biopsies in 3 weeks.    SIGNATURES/CONFIDENTIALITY: You and/or your care partner have signed paperwork which will be entered into your electronic medical record.  These signatures attest to the fact that that the information above on your After Visit Summary has been reviewed and is understood.  Full responsibility of the confidentiality of this discharge information lies with you and/or your care-partner.  Diverticulosis, hemorrhoids, high fiber diet-handouts given  Repeat colonoscopy in 5 years 2021.

## 2014-06-15 ENCOUNTER — Telehealth: Payer: Self-pay | Admitting: *Deleted

## 2014-06-15 NOTE — Telephone Encounter (Signed)
  Follow up Call-  Call back number 06/14/2014 04/18/2012  Post procedure Call Back phone  # [phone]  Permission to leave phone message Yes Yes     Patient questions:  Do you have a fever, pain , or abdominal swelling? No. Pain Score  0 *  Have you tolerated food without any problems? Yes.    Have you been able to return to your normal activities? Yes.    Do you have any questions about your discharge instructions: Diet   No. Medications  No. Follow up visit  No.  Do you have questions or concerns about your Care? No.  Actions: * If pain score is 4 or above: No action needed, pain <4.

## 2014-06-23 ENCOUNTER — Other Ambulatory Visit: Payer: Self-pay | Admitting: Family Medicine

## 2014-06-23 ENCOUNTER — Encounter: Payer: Self-pay | Admitting: Family Medicine

## 2014-06-23 NOTE — Telephone Encounter (Signed)
Pt needs ov and labs - will send letter

## 2014-06-28 ENCOUNTER — Encounter: Payer: Self-pay | Admitting: Family Medicine

## 2014-06-28 ENCOUNTER — Ambulatory Visit (INDEPENDENT_AMBULATORY_CARE_PROVIDER_SITE_OTHER): Payer: 59 | Admitting: Family Medicine

## 2014-06-28 VITALS — BP 130/90 | HR 86 | Temp 98.2°F | Resp 18 | Ht 75.0 in | Wt 249.0 lb

## 2014-06-28 DIAGNOSIS — J302 Other seasonal allergic rhinitis: Secondary | ICD-10-CM

## 2014-06-28 MED ORDER — FLUTICASONE PROPIONATE 50 MCG/ACT NA SUSP: 2.0000 | Freq: Every day | NASAL | Status: DC

## 2014-06-28 MED ORDER — LORATADINE 10 MG PO TABS: 10.0000 mg | ORAL_TABLET | Freq: Every day | ORAL | Status: DC

## 2014-06-28 NOTE — Progress Notes (Signed)
Subjective:    Patient ID: James Santiago, male    DOB: September 11, 1950, 64 y.o.   MRN: [id number]  HPI  Patient reports scratchy throat, postnasal drip, itchy watery eyes, sinus pressure, and head congestion for 1 week. 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 This seems to aggravate it. He is taking no medication for allergies. He denies any fever or sinus pain Past Medical History  Diagnosis Date  . Hypertension   . GERD (gastroesophageal reflux disease)   . Gout   . Hiatal hernia   . Cholelithiasis     "still got 2 small stones in there" (02/01/2012)  . Hypercholesteremia   . Arthritis     "fingers" (02/01/2012)  . Adenomatous colon polyp 06/2003  . Hemorrhoids   . Esophageal stricture   . Biliary dyskinesia   . A-fib     a. Dx 01/2012, CHADS2 = 1.-[facility] in past.  . Nephrolithiasis     "was told kidney function at 65%"- hx. kidney stones   Past Surgical History  Procedure Laterality Date  . Inguinal hernia repair  ? 2008    "right" (02/01/2012)  . Cystoscopy with retrograde pyelogram, ureteroscopy and stent placement  1980's  . Cystoscopy w/ ureteroscopy w/ lithotripsy  1980's  . Vasectomy    . Cardioversion  03/06/2012    Procedure: CARDIOVERSION;  Surgeon: Larey Dresser, MD;  Location: [facility];  Service: Cardiovascular;  Laterality: N/A;  . Esophageal dilation    . Cholecystectomy N/A 11/13/2012    Procedure: LAPAROSCOPIC CHOLECYSTECTOMY;  Surgeon: Rolm Bookbinder, MD;  Location: [facility];  Service: General;  Laterality: N/A;  . Colonoscopy    . Polypectomy     Current Outpatient Prescriptions on File Prior to Visit  Medication Sig Dispense Refill  . allopurinol (ZYLOPRIM) 300 MG tablet Take 1 tablet by mouth  every day 90 tablet 3  . amLODipine (NORVASC) 10 MG tablet Take 1 tablet by mouth  every day 90 tablet 3  . aspirin EC 81 MG tablet Take 1 tablet (81 mg total) by mouth daily. 90 tablet 3  . dexlansoprazole  (DEXILANT) 60 MG capsule Take 1 capsule (60 mg total) by mouth daily. 90 capsule 3  . simvastatin (ZOCOR) 10 MG tablet Take 1 tablet by mouth  every night at bedtime 90 tablet 0  . telmisartan (MICARDIS) 80 MG tablet Take 1 tablet (80 mg total) by mouth every morning. 90 tablet 3   No current facility-administered medications on file prior to visit.   No Known Allergies History   Social History  . Marital Status: Married    Spouse Name: N/A  . Number of Children: 1  . Years of Education: N/A   Occupational History  .  Unemployed   Social History Main Topics  . Smoking status: Former Smoker -- 0.50 packs/day for 12 years    Types: Cigarettes  . Smokeless tobacco: Current User    Types: Chew     Comment: 02/01/2012 smoked cigarettes "probably 30 yr ago."  Currently uses 1/2 pouch of chewing tobacco daily; offered cessation materials; pt declines.  . Alcohol Use: 0.0 oz/week    0 Standard drinks or equivalent per week     Comment: occ. beer  . Drug Use: No  . Sexual Activity: Not Currently   Other Topics Concern  . Not on file   Social History Narrative   Lives in [place] with wife.  They have one grown child and  a grandson.  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    Review of Systems  All other systems reviewed and are negative.      Objective:   Physical Exam  Constitutional: He appears well-developed and well-nourished. No distress.  HENT:  Right Ear: External ear normal.  Left Ear: External ear normal.  Nose: Nose normal.  Mouth/Throat: Oropharynx is clear and moist. No oropharyngeal exudate.  Eyes: Conjunctivae are normal. Pupils are equal, round, and reactive to light.  Neck: Neck supple. No JVD present.  Cardiovascular: Normal rate, regular rhythm and normal heart sounds.   No murmur heard. Pulmonary/Chest: Effort normal and breath sounds normal. No respiratory distress. He has no wheezes. He has no rales.  Abdominal: Soft. Bowel  sounds are normal. He exhibits no distension. There is no tenderness. There is no rebound and no guarding.  Musculoskeletal: He exhibits no edema.  Lymphadenopathy:    He has no cervical adenopathy.  Skin: He is not diaphoretic.  Vitals reviewed.         Assessment & Plan:  Seasonal allergies - Plan: fluticasone (FLONASE) 50 MCG/ACT nasal spray, loratadine (CLARITIN) 10 MG tablet, DISCONTINUED: loratadine (CLARITIN) 10 MG tablet, DISCONTINUED: fluticasone (FLONASE) 50 MCG/ACT nasal spray  I believe the patient has sinusitis due to seasonal allergies. I recommended Claritin 10 mg by mouth daily as well as Flonase 2 sprays each nostril daily. He is to combine these medications for the first week then discontinue the Claritin and hopefully just the Flonase will be sufficient.

## 2014-09-21 ENCOUNTER — Other Ambulatory Visit: Payer: Self-pay | Admitting: Family Medicine

## 2014-09-21 NOTE — Telephone Encounter (Signed)
Medication refilled per protocol. 

## 2014-10-01 IMAGING — US US ABDOMEN COMPLETE
1 series · 14 of 25 positions shown · non-contrast
Comparison: 11/08/2004

CLINICAL DATA: Abdominal pain.

ABDOMINAL ULTRASOUND COMPLETE

[Series 1: us abdomen complete · 0.23mm/px · 14 of 73 slices shown]
[im 1/73]
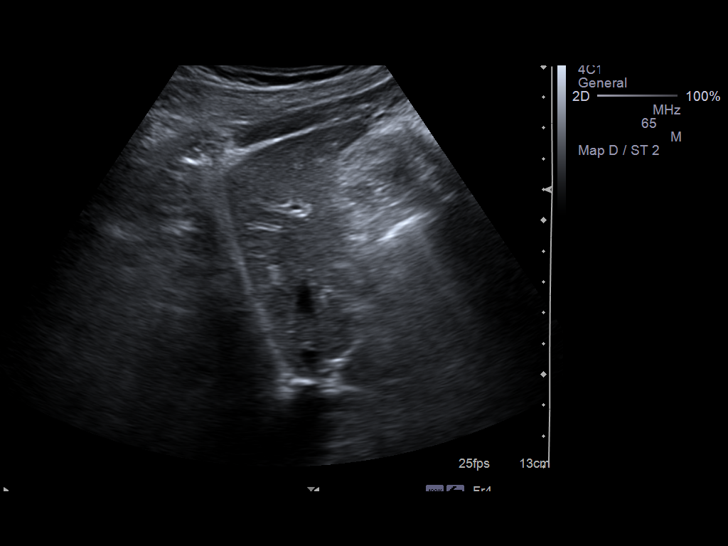
[im 7/73]
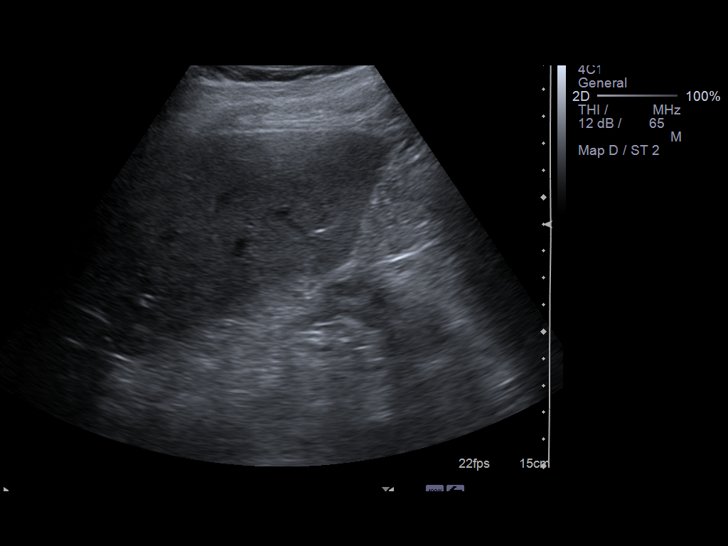
[im 13/73]
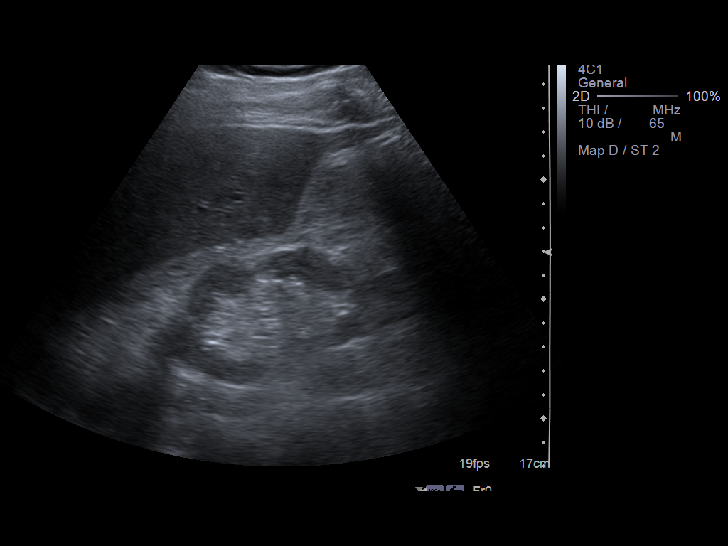
[im 19/73]
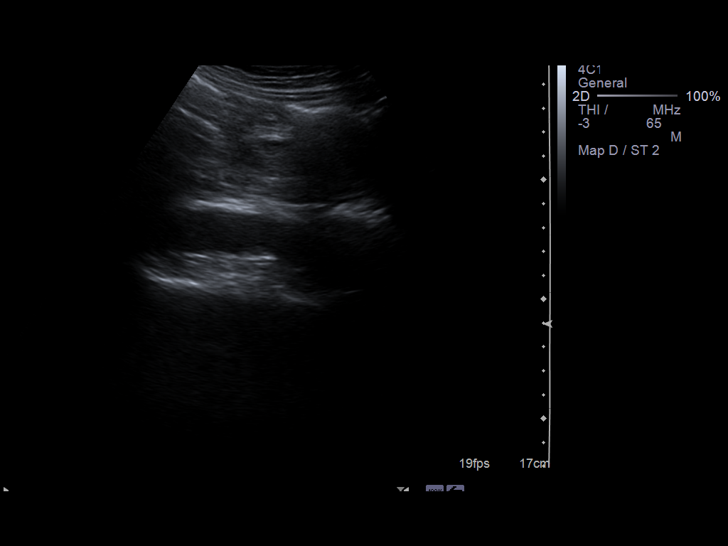
[im 25/73]
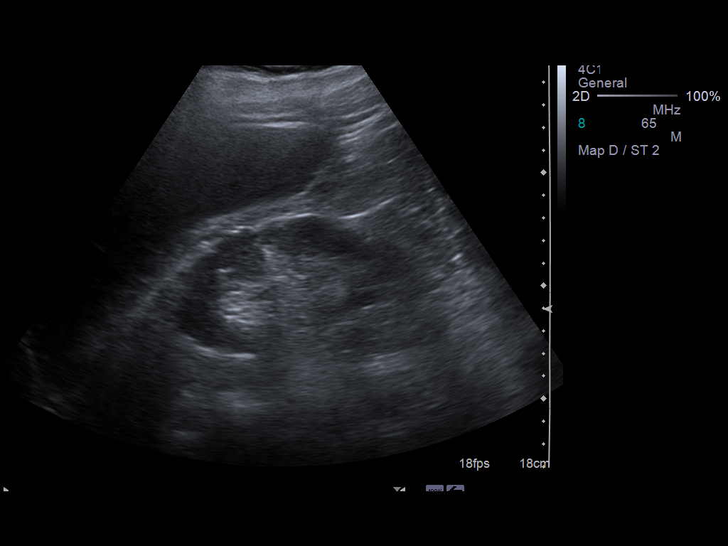
[im 28/73]
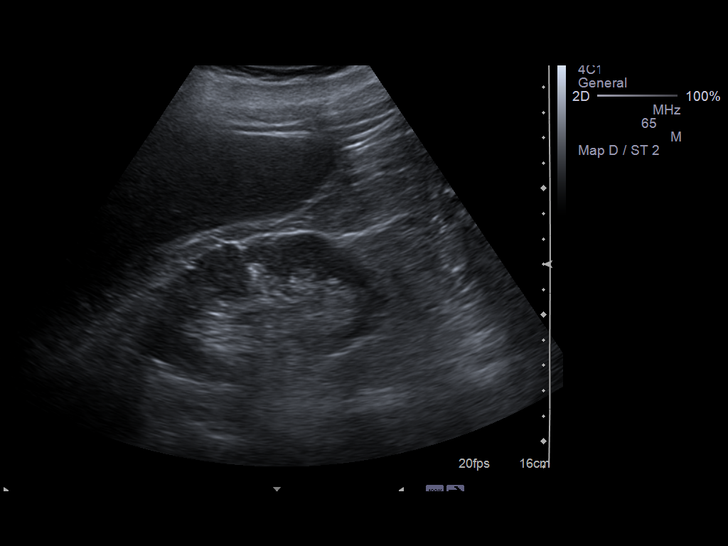
[im 34/73]
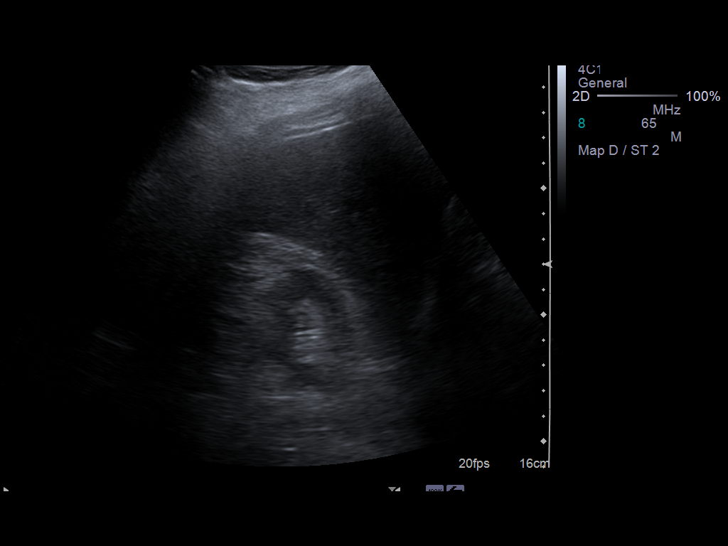
[im 40/73]
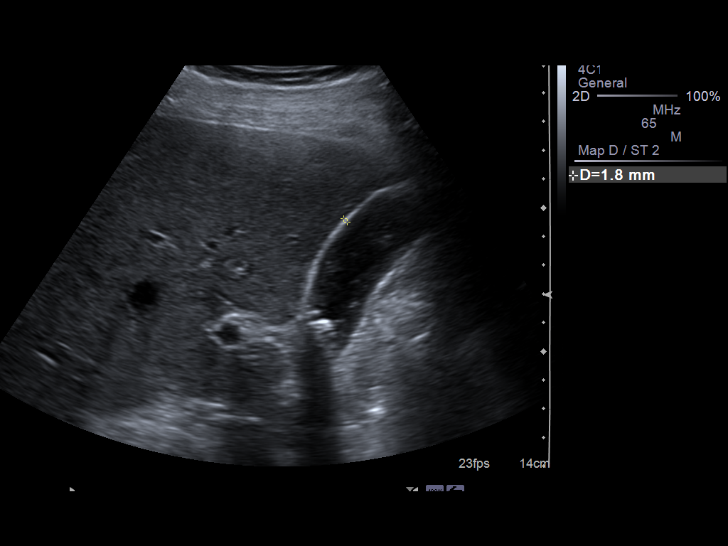
[im 46/73]
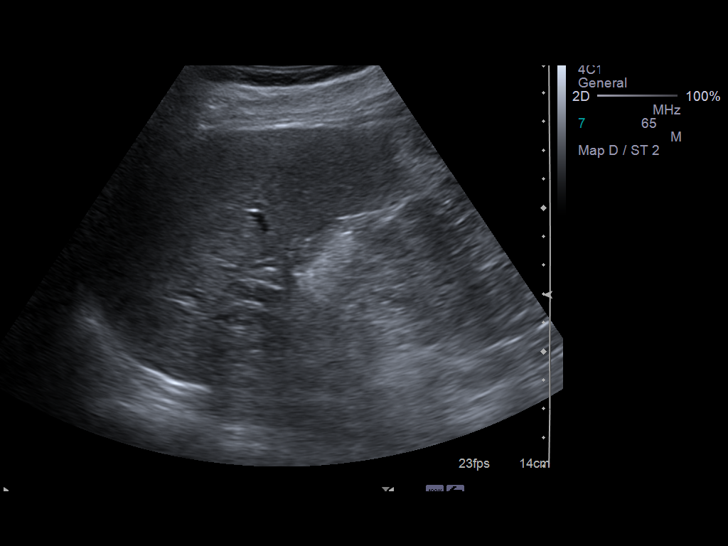
[im 49/73]
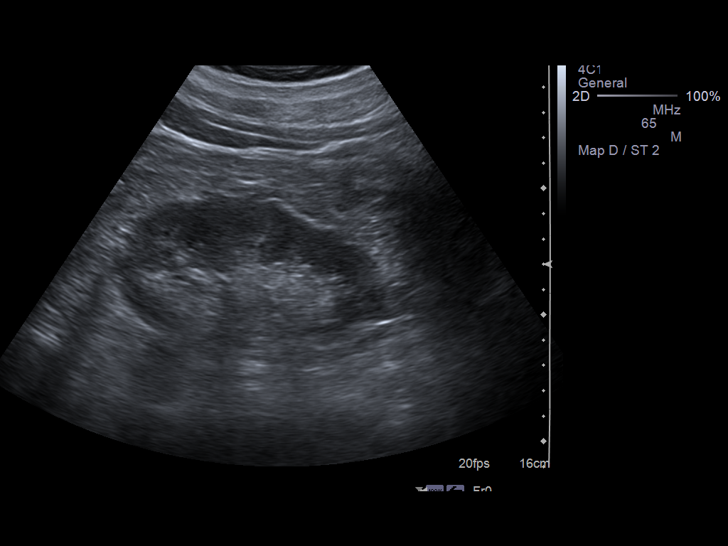
[im 55/73]
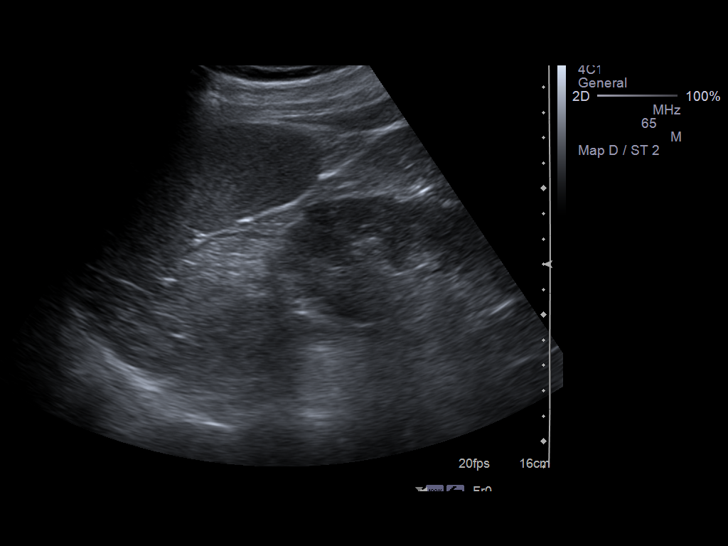
[im 61/73]
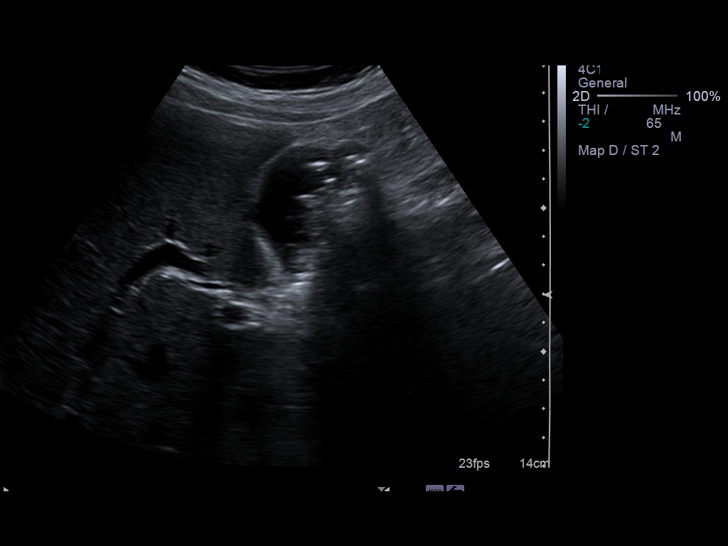
[im 67/73]
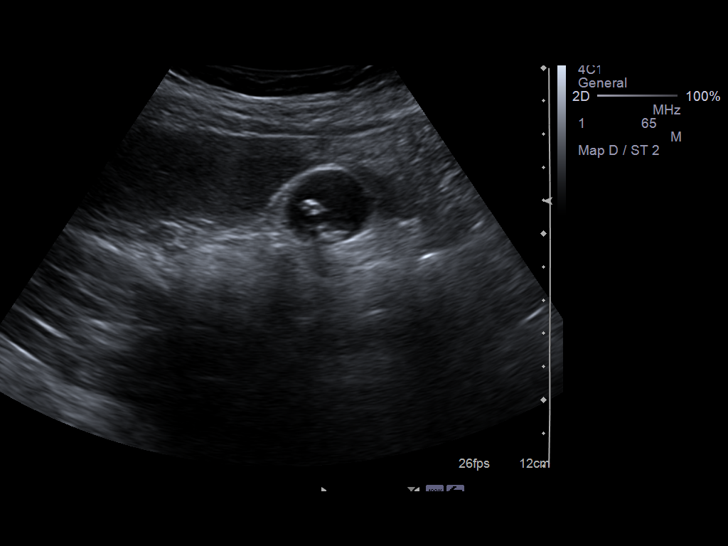
[im 73/73]
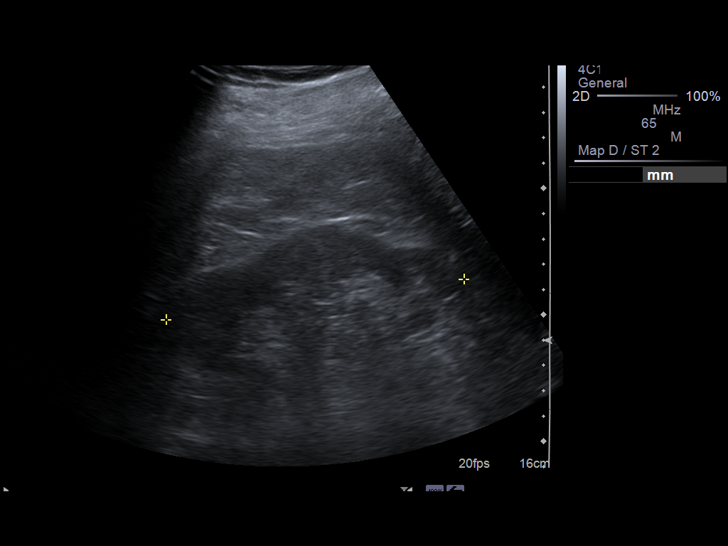

[14 of 25 positions shown; findings below may reference images not displayed]

FINDINGS: Gallbladder:  Multiple stones identified within the lumen of the
gallbladder.  These measure up to 1.1 cm.  The gallbladder wall
measures 1.8 mm in thickness.  Negative for pericholecystic fluid.
No sonographic Murphy's sign.

Common Bile Duct:  Within normal limits in caliber.

Liver: No focal mass lesion identified.  Within normal limits in
parenchymal echogenicity.

IVC:  Appears normal.

Pancreas:  No abnormality identified.

Spleen:  Within normal limits in size and echotexture.

Right kidney:  Normal in size and parenchymal echogenicity.  No
evidence of mass or hydronephrosis.

Left kidney:  Normal in size and parenchymal echogenicity.  No
evidence of mass or hydronephrosis.

Abdominal Aorta:  No aneurysm identified.
IMPRESSION: 1.  Gallstones.

## 2014-11-19 ENCOUNTER — Ambulatory Visit (INDEPENDENT_AMBULATORY_CARE_PROVIDER_SITE_OTHER): Payer: 59 | Admitting: Family Medicine

## 2014-11-19 ENCOUNTER — Encounter: Payer: Self-pay | Admitting: Family Medicine

## 2014-11-19 ENCOUNTER — Other Ambulatory Visit: Payer: Self-pay | Admitting: Family Medicine

## 2014-11-19 VITALS — BP 130/80 | HR 98 | Temp 97.7°F | Resp 18 | Ht 75.0 in | Wt 250.0 lb

## 2014-11-19 DIAGNOSIS — J208 Acute bronchitis due to other specified organisms: Secondary | ICD-10-CM

## 2014-11-19 MED ORDER — ALBUTEROL SULFATE HFA 108 (90 BASE) MCG/ACT IN AERS: 2.0000 | INHALATION_SPRAY | Freq: Four times a day (QID) | RESPIRATORY_TRACT | Status: DC | PRN

## 2014-11-19 MED ORDER — HYDROCODONE-HOMATROPINE 5-1.5 MG/5ML PO SYRP: 5.0000 mL | ORAL_SOLUTION | Freq: Three times a day (TID) | ORAL | Status: DC | PRN

## 2014-11-19 MED ORDER — AZITHROMYCIN 250 MG PO TABS: ORAL_TABLET | ORAL | Status: DC

## 2014-11-19 NOTE — Telephone Encounter (Signed)
meds went to wrong pharmacy this morning

## 2014-11-19 NOTE — Progress Notes (Signed)
Subjective:    Patient ID: James Santiago, male    DOB: January 01, 1951, 64 y.o.   MRN: [id number]  HPI  Patient has been coughing nonstop for over a week. He has spells of coughing that almost caused him to gag. The cough is productive of green sputum. He denies any rhinorrhea or sneezing or sinus pressure or postnasal drip. He has had subjective fevers. He also reports some mild shortness of breath. On exam today, his breath sounds are distant and he has some faint expiratory wheezes Past Medical History  Diagnosis Date  . Hypertension   . GERD (gastroesophageal reflux disease)   . Gout   . Hiatal hernia   . Cholelithiasis     "still got 2 small stones in there" (02/01/2012)  . Hypercholesteremia   . Arthritis     "fingers" (02/01/2012)  . Adenomatous colon polyp 06/2003  . Hemorrhoids   . Esophageal stricture   . Biliary dyskinesia   . A-fib ([place])     a. Dx 01/2012, CHADS2 = 1.-[facility] in past.  . Nephrolithiasis     "was told kidney function at 65%"- hx. kidney stones   Past Surgical History  Procedure Laterality Date  . Inguinal hernia repair  ? 2008    "right" (02/01/2012)  . Cystoscopy with retrograde pyelogram, ureteroscopy and stent placement  1980's  . Cystoscopy w/ ureteroscopy w/ lithotripsy  1980's  . Vasectomy    . Cardioversion  03/06/2012    Procedure: CARDIOVERSION;  Surgeon: Larey Dresser, MD;  [facility];  Service: Cardiovascular;  Laterality: N/A;  . Esophageal dilation    . Cholecystectomy N/A 11/13/2012    Procedure: LAPAROSCOPIC CHOLECYSTECTOMY;  Surgeon: Rolm Bookbinder, MD;  Location: [facility]S;  Service: General;  Laterality: N/A;  . Colonoscopy    . Polypectomy     Current Outpatient Prescriptions on File Prior to Visit  Medication Sig Dispense Refill  . allopurinol (ZYLOPRIM) 300 MG tablet Take 1 tablet by mouth  every day 90 tablet 3  . amLODipine (NORVASC) 10 MG tablet Take 1 tablet by mouth  every day 90 tablet 3  . aspirin EC 81  MG tablet Take 1 tablet (81 mg total) by mouth daily. 90 tablet 3  . dexlansoprazole (DEXILANT) 60 MG capsule Take 1 capsule (60 mg total) by mouth daily. 90 capsule 3  . fluticasone (FLONASE) 50 MCG/ACT nasal spray Place 2 sprays into both nostrils daily. 16 g 6  . loratadine (CLARITIN) 10 MG tablet Take 1 tablet (10 mg total) by mouth daily. 30 tablet 0  . simvastatin (ZOCOR) 10 MG tablet Take 1 tablet by mouth  every night at bedtime 90 tablet 1  . telmisartan (MICARDIS) 80 MG tablet Take 1 tablet (80 mg total) by mouth every morning. 90 tablet 3   No current facility-administered medications on file prior to visit.   No Known Allergies Social History   Social History  . Marital Status: Married    Spouse Name: N/A  . Number of Children: 1  . Years of Education: N/A   Occupational History  .  Unemployed   Social History Main Topics  . Smoking status: Former Smoker -- 0.50 packs/day for 12 years    Types: Cigarettes  . Smokeless tobacco: Current User    Types: Chew     Comment: 02/01/2012 smoked cigarettes "probably 30 yr ago."  Currently uses 1/2 pouch of chewing tobacco daily; offered cessation materials; pt declines.  . Alcohol Use: 0.0 oz/week  0 Standard drinks or equivalent per week     Comment: occ. beer  . Drug Use: No  . Sexual Activity: Not Currently   Other Topics Concern  . Not on file   Social History Narrative   Lives in [place] with wife.  They have one grown child and a grandson.  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     Review of Systems  All other systems reviewed and are negative.      Objective:   Physical Exam  Constitutional: He appears well-developed and well-nourished.  HENT:  Right Ear: External ear normal.  Left Ear: External ear normal.  Nose: Nose normal.  Mouth/Throat: Oropharynx is clear and moist. No oropharyngeal exudate.  Eyes: Conjunctivae are normal.  Neck: Neck supple.  Cardiovascular: Normal  rate, regular rhythm and normal heart sounds.   Pulmonary/Chest: Effort normal. He has decreased breath sounds. He has wheezes.  Lymphadenopathy:    He has no cervical adenopathy.  Vitals reviewed.         Assessment & Plan:  Acute bronchitis due to other specified organisms - Plan: azithromycin (ZITHROMAX) 250 MG tablet, albuterol (PROVENTIL HFA;VENTOLIN HFA) 108 (90 BASE) MCG/ACT inhaler, HYDROcodone-homatropine (HYCODAN) 5-1.5 MG/5ML syrup  Begin a Z-Pak to treat bacterial bronchitis. Use albuterol 2 puffs inhaled every 6 hours as needed for wheezing. He can also use Hycodan 1 teaspoon every 8 hours as needed for cough.

## 2014-11-23 ENCOUNTER — Other Ambulatory Visit: Payer: Self-pay | Admitting: Family Medicine

## 2014-11-23 NOTE — Telephone Encounter (Signed)
Medication refilled per protocol. 

## 2015-01-27 ENCOUNTER — Other Ambulatory Visit: Payer: Self-pay | Admitting: Family Medicine

## 2015-02-01 ENCOUNTER — Encounter: Payer: Self-pay | Admitting: Family Medicine

## 2015-02-01 ENCOUNTER — Other Ambulatory Visit: Payer: Self-pay | Admitting: Family Medicine

## 2015-02-01 NOTE — Telephone Encounter (Signed)
Medication refill for one time only.  Patient needs to be seen.  Letter sent for patient to call and schedule.  Has been almost two years since routine office visit and fasting lab work.

## 2015-02-03 ENCOUNTER — Ambulatory Visit (INDEPENDENT_AMBULATORY_CARE_PROVIDER_SITE_OTHER): Payer: 59 | Admitting: Family Medicine

## 2015-02-03 ENCOUNTER — Encounter: Payer: Self-pay | Admitting: Family Medicine

## 2015-02-03 VITALS — BP 154/96 | HR 90 | Temp 98.5°F | Resp 14 | Wt 252.0 lb

## 2015-02-03 DIAGNOSIS — J019 Acute sinusitis, unspecified: Secondary | ICD-10-CM | POA: Diagnosis not present

## 2015-02-03 MED ORDER — AMOXICILLIN-POT CLAVULANATE 875-125 MG PO TABS: 1.0000 | ORAL_TABLET | Freq: Two times a day (BID) | ORAL | Status: DC

## 2015-02-03 NOTE — Progress Notes (Signed)
Subjective:    Patient ID: James Santiago, male    DOB: [birth year], 64 y.o.   MRN: [id number]  HPI The patient's symptoms began 2 days ago with right frontal sinus pain and pressure, post nasal drip, scratchy throat, and chest congestion and cough productive of yellow mucus.  He denies fever. However he does report a headache near his right frontal sinus. His wife, and 2 grandchildren have all had sinus infections recently. Past Medical History  Diagnosis Date  . Hypertension   . GERD (gastroesophageal reflux disease)   . Gout   . Hiatal hernia   . Cholelithiasis     "still got 2 small stones in there" (02/01/2012)  . Hypercholesteremia   . Arthritis     "fingers" (02/01/2012)  . Adenomatous colon polyp 06/2003  . Hemorrhoids   . Esophageal stricture   . Biliary dyskinesia   . A-fib ([place])     a. Dx 01/2012, CHADS2 = 1.-[facility] in past.  . Nephrolithiasis     "was told kidney function at 65%"- hx. kidney stones   Past Surgical History  Procedure Laterality Date  . Inguinal hernia repair  ? 2008    "right" (02/01/2012)  . Cystoscopy with retrograde pyelogram, ureteroscopy and stent placement  1980's  . Cystoscopy w/ ureteroscopy w/ lithotripsy  1980's  . Vasectomy    . Cardioversion  03/06/2012    Procedure: CARDIOVERSION;  Surgeon: Larey Dresser, MD;  Location: [facility] ENDOSCOPY;  Service: Cardiovascular;  Laterality: N/A;  . Esophageal dilation    . Cholecystectomy N/A 11/13/2012    Procedure: LAPAROSCOPIC CHOLECYSTECTOMY;  Surgeon: Rolm Bookbinder, MD;  Location: [facility] ORS;  Service: General;  Laterality: N/A;  . Colonoscopy    . Polypectomy     Current Outpatient Prescriptions on File Prior to Visit  Medication Sig Dispense Refill  . albuterol (PROVENTIL HFA;VENTOLIN HFA) 108 (90 BASE) MCG/ACT inhaler Inhale 2 puffs into the lungs every 6 (six) hours as needed for wheezing or shortness of breath. 1 Inhaler 0  . allopurinol (ZYLOPRIM) 300 MG tablet Take 1 tablet by mouth   every day 90 tablet 1  . amLODipine (NORVASC) 10 MG tablet Take 1 tablet by mouth  every day 90 tablet 1  . aspirin EC 81 MG tablet Take 1 tablet (81 mg total) by mouth daily. 90 tablet 3  . azithromycin (ZITHROMAX) 250 MG tablet 2 tabs poqday1, 1 tab poqday 2-5 (Patient not taking: Reported on 02/03/2015) 6 tablet 0  . DEXILANT 60 MG capsule Take 1 capsule by mouth  daily 90 capsule 1  . doxazosin (CARDURA) 4 MG tablet Take 1 tablet by mouth  every day (Patient not taking: Reported on 02/03/2015) 90 tablet 1  . fluticasone (FLONASE) 50 MCG/ACT nasal spray Place 2 sprays into both nostrils daily. (Patient not taking: Reported on 02/03/2015) 16 g 6  . HYDROcodone-homatropine (HYCODAN) 5-1.5 MG/5ML syrup Take 5 mLs by mouth every 8 (eight) hours as needed for cough. (Patient not taking: Reported on 02/03/2015) 120 mL 0  . loratadine (CLARITIN) 10 MG tablet Take 1 tablet (10 mg total) by mouth daily. 30 tablet 0  . simvastatin (ZOCOR) 10 MG tablet Take 1 tablet by mouth  every night at bedtime 90 tablet 1  . telmisartan (MICARDIS) 80 MG tablet Take 1 tablet by mouth  every morning 30 tablet 0   No current facility-administered medications on file prior to visit.   No Known Allergies Social History   Social History  .  Marital Status: Married    Spouse Name: N/A  . Number of Children: 1  . Years of Education: N/A   Occupational History  .  Unemployed   Social History Main Topics  . Smoking status: Former Smoker -- 0.50 packs/day for 12 years    Types: Cigarettes  . Smokeless tobacco: Current User    Types: Chew     Comment: 02/01/2012 smoked cigarettes "probably 30 yr ago."  Currently uses 1/2 pouch of chewing tobacco daily; offered cessation materials; pt declines.  . Alcohol Use: 0.0 oz/week    0 Standard drinks or equivalent per week     Comment: occ. beer  . Drug Use: No  . Sexual Activity: Not Currently   Other Topics Concern  . Not on file   Social History Narrative   Lives  in [place] with wife.  They have one grown child and a grandson.  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      Review of Systems  All other systems reviewed and are negative.      Objective:   Physical Exam  Constitutional: He appears well-developed and well-nourished.  HENT:  Right Ear: Tympanic membrane, external ear and ear canal normal.  Left Ear: Tympanic membrane, external ear and ear canal normal.  Nose: Mucosal edema and rhinorrhea present. Right sinus exhibits frontal sinus tenderness.  Mouth/Throat: Oropharynx is clear and moist. No oropharyngeal exudate.  Eyes: Conjunctivae are normal.  Neck: Neck supple.  Cardiovascular: Normal rate, regular rhythm and normal heart sounds.   Pulmonary/Chest: Effort normal and breath sounds normal. No respiratory distress. He has no wheezes. He has no rales.  Lymphadenopathy:    He has no cervical adenopathy.  Vitals reviewed.         Assessment & Plan:  Acute rhinosinusitis - Plan: amoxicillin-clavulanate (AUGMENTIN) 875-125 MG tablet  Patient has a sinus infection. The right explained to the patient I believe this is a viral sinus infection. I recommended tincture of time as I believe the symptoms will gradually improve over the next 7-10 days. His blood pressure today is elevated and therefore I would not recommend Sudafed. Instead I recommended Mucinex DM for congestion and cough as well as a neti-pot for congestion. I did give the patient a prescription for Augmentin in case symptoms persist greater than 10 days or in case he develops a fever with severe sinus pain over Christmas. He will check his blood pressure frequently over the next week and if consistently elevated, he will resume the doxazosin 4 mg by mouth daily

## 2015-02-22 ENCOUNTER — Ambulatory Visit: Payer: 59 | Admitting: Family Medicine

## 2015-03-07 ENCOUNTER — Encounter: Payer: Self-pay | Admitting: Family Medicine

## 2015-03-07 ENCOUNTER — Ambulatory Visit (INDEPENDENT_AMBULATORY_CARE_PROVIDER_SITE_OTHER): Payer: 59 | Admitting: Family Medicine

## 2015-03-07 VITALS — BP 130/92 | HR 86 | Temp 98.2°F | Resp 18 | Ht 75.0 in | Wt 252.0 lb

## 2015-03-07 DIAGNOSIS — I10 Essential (primary) hypertension: Secondary | ICD-10-CM | POA: Diagnosis not present

## 2015-03-07 DIAGNOSIS — E785 Hyperlipidemia, unspecified: Secondary | ICD-10-CM | POA: Diagnosis not present

## 2015-03-07 DIAGNOSIS — M1000 Idiopathic gout, unspecified site: Secondary | ICD-10-CM | POA: Diagnosis not present

## 2015-03-07 DIAGNOSIS — Z125 Encounter for screening for malignant neoplasm of prostate: Secondary | ICD-10-CM | POA: Diagnosis not present

## 2015-03-07 DIAGNOSIS — Z23 Encounter for immunization: Secondary | ICD-10-CM | POA: Diagnosis not present

## 2015-03-07 LAB — COMPLETE METABOLIC PANEL WITH GFR
ALBUMIN: 4.2 g/dL (ref 3.6–5.1)
ALK PHOS: 123 U/L — AB (ref 40–115)
ALT: 13 U/L (ref 9–46)
AST: 13 U/L (ref 10–35)
BUN: 10 mg/dL (ref 7–25)
CALCIUM: 9.3 mg/dL (ref 8.6–10.3)
CO2: 24 mmol/L (ref 20–31)
CREATININE: 1.31 mg/dL — AB (ref 0.70–1.25)
Chloride: 105 mmol/L (ref 98–110)
GFR, EST AFRICAN AMERICAN: 67 mL/min (ref >=60)
GFR, Est Non African American: 58 mL/min — ABNORMAL LOW (ref >=60)
Glucose, Bld: 101 mg/dL — ABNORMAL HIGH (ref 70–99)
POTASSIUM: 3.7 mmol/L (ref 3.5–5.3)
Sodium: 142 mmol/L (ref 135–146)
TOTAL PROTEIN: 6.3 g/dL (ref 6.1–8.1)
Total Bilirubin: 1.4 mg/dL — ABNORMAL HIGH (ref 0.2–1.2)

## 2015-03-07 LAB — URIC ACID: Uric Acid, Serum: 3.6 mg/dL — ABNORMAL LOW (ref 4.0–7.8)

## 2015-03-07 LAB — LIPID PANEL
CHOL/HDL RATIO: 2.5 ratio (ref <=5.0)
CHOLESTEROL: 131 mg/dL (ref 125–200)
HDL: 52 mg/dL (ref >=40)
LDL Cholesterol: 62 mg/dL (ref <130)
TRIGLYCERIDES: 84 mg/dL (ref <150)
VLDL: 17 mg/dL (ref <30)

## 2015-03-07 MED ORDER — ALLOPURINOL 300 MG PO TABS: ORAL_TABLET | ORAL | Status: DC

## 2015-03-07 MED ORDER — TELMISARTAN 80 MG PO TABS: ORAL_TABLET | ORAL | Status: DC

## 2015-03-07 MED ORDER — SIMVASTATIN 10 MG PO TABS: ORAL_TABLET | ORAL | Status: DC

## 2015-03-07 MED ORDER — DOXAZOSIN MESYLATE 4 MG PO TABS: ORAL_TABLET | ORAL | Status: DC

## 2015-03-07 MED ORDER — DEXLANSOPRAZOLE 60 MG PO CPDR: DELAYED_RELEASE_CAPSULE | ORAL | Status: DC

## 2015-03-07 MED ORDER — AMLODIPINE BESYLATE 10 MG PO TABS: ORAL_TABLET | ORAL | Status: DC

## 2015-03-07 MED ORDER — ATENOLOL 50 MG PO TABS: 50.0000 mg | ORAL_TABLET | Freq: Every day | ORAL | Status: DC

## 2015-03-07 NOTE — Progress Notes (Signed)
Subjective:    Patient ID: James Santiago, male    DOB: 06-16-1950, 65 y.o.   MRN: [id number]  HPI The patient is here today for routine follow-up of his chronic medical conditions. He has an underlying history of hypertension, hyperlipidemia, gout, and mild chronic kidney disease. He also has a history of paroxysmal atrial fibrillation which is currently managing with aspirin for stroke prevention. He is here today for fasting lab work.  Today he is in normal sinus rhythm. He denies any chest pain or shortness of breath. However his blood pressure is elevated. He's been getting similar readings at home. In fact when I checked his blood pressure I found to be 148/92. He is due for fasting lab work. Otherwise he denies any complaints. The Augmentin that I gave him for the sinus infection caused severe stomach upset and he would prefer never to take that again. He is due for his flu shot. He is also due for a PSA Past Medical History  Diagnosis Date  . Hypertension   . GERD (gastroesophageal reflux disease)   . Gout   . Hiatal hernia   . Cholelithiasis     "still got 2 small stones in there" (02/01/2012)  . Hypercholesteremia   . Arthritis     "fingers" (02/01/2012)  . Adenomatous colon polyp 06/2003  . Hemorrhoids   . Esophageal stricture   . Biliary dyskinesia   . A-fib ([place])     a. Dx 01/2012, CHADS2 = 1.-[facility] in past.  . Nephrolithiasis     "was told kidney function at 65%"- hx. kidney stones   Past Surgical History  Procedure Laterality Date  . Inguinal hernia repair  ? 2008    "right" (02/01/2012)  . Cystoscopy with retrograde pyelogram, ureteroscopy and stent placement  1980's  . Cystoscopy w/ ureteroscopy w/ lithotripsy  1980's  . Vasectomy    . Cardioversion  03/06/2012    Procedure: CARDIOVERSION;  Surgeon: Larey Dresser, MD;  Location: [facility] ENDOSCOPY;  Service: Cardiovascular;  Laterality: N/A;  . Esophageal dilation    . Cholecystectomy N/A 11/13/2012   Procedure: LAPAROSCOPIC CHOLECYSTECTOMY;  Surgeon: Rolm Bookbinder, MD;  Location: [facility] ORS;  Service: General;  Laterality: N/A;  . Colonoscopy    . Polypectomy     Current Outpatient Prescriptions on File Prior to Visit  Medication Sig Dispense Refill  . albuterol (PROVENTIL HFA;VENTOLIN HFA) 108 (90 BASE) MCG/ACT inhaler Inhale 2 puffs into the lungs every 6 (six) hours as needed for wheezing or shortness of breath. 1 Inhaler 0  . allopurinol (ZYLOPRIM) 300 MG tablet Take 1 tablet by mouth  every day 90 tablet 1  . amLODipine (NORVASC) 10 MG tablet Take 1 tablet by mouth  every day 90 tablet 1  . amoxicillin-clavulanate (AUGMENTIN) 875-125 MG tablet Take 1 tablet by mouth 2 (two) times daily. 20 tablet 0  . aspirin EC 81 MG tablet Take 1 tablet (81 mg total) by mouth daily. 90 tablet 3  . azithromycin (ZITHROMAX) 250 MG tablet 2 tabs poqday1, 1 tab poqday 2-5 (Patient not taking: Reported on 02/03/2015) 6 tablet 0  . DEXILANT 60 MG capsule Take 1 capsule by mouth  daily 90 capsule 1  . doxazosin (CARDURA) 4 MG tablet Take 1 tablet by mouth  every day (Patient not taking: Reported on 02/03/2015) 90 tablet 1  . fluticasone (FLONASE) 50 MCG/ACT nasal spray Place 2 sprays into both nostrils daily. (Patient not taking: Reported on 02/03/2015) 16 g 6  .  HYDROcodone-homatropine (HYCODAN) 5-1.5 MG/5ML syrup Take 5 mLs by mouth every 8 (eight) hours as needed for cough. (Patient not taking: Reported on 02/03/2015) 120 mL 0  . loratadine (CLARITIN) 10 MG tablet Take 1 tablet (10 mg total) by mouth daily. 30 tablet 0  . simvastatin (ZOCOR) 10 MG tablet Take 1 tablet by mouth  every night at bedtime 90 tablet 1  . telmisartan (MICARDIS) 80 MG tablet Take 1 tablet by mouth  every morning 30 tablet 0   No current facility-administered medications on file prior to visit.   No Known Allergies Social History   Social History  . Marital Status: Married    Spouse Name: N/A  . Number of Children: 1  .  Years of Education: N/A   Occupational History  .  Unemployed   Social History Main Topics  . Smoking status: Former Smoker -- 0.50 packs/day for 12 years    Types: Cigarettes  . Smokeless tobacco: Current User    Types: Chew     Comment: 02/01/2012 smoked cigarettes "probably 30 yr ago."  Currently uses 1/2 pouch of chewing tobacco daily; offered cessation materials; pt declines.  . Alcohol Use: 0.0 oz/week    0 Standard drinks or equivalent per week     Comment: occ. beer  . Drug Use: No  . Sexual Activity: Not Currently   Other Topics Concern  . Not on file   Social History Narrative   Lives in [place] with wife.  They have one grown child and a grandson.  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     Review of Systems  All other systems reviewed and are negative.      Objective:   Physical Exam  Constitutional: He appears well-developed and well-nourished.  Neck: Neck supple. No JVD present. No thyromegaly present.  Cardiovascular: Normal rate, regular rhythm and normal heart sounds.   No murmur heard. Pulmonary/Chest: Effort normal and breath sounds normal. No respiratory distress. He has no wheezes. He has no rales.  Abdominal: Soft. Bowel sounds are normal. He exhibits no distension. There is no tenderness. There is no rebound and no guarding.  Musculoskeletal: He exhibits no edema.  Lymphadenopathy:    He has no cervical adenopathy.  Vitals reviewed.         Assessment & Plan:  HLD (hyperlipidemia) - Plan: COMPLETE METABOLIC PANEL WITH GFR, Lipid panel  Idiopathic gout, unspecified chronicity, unspecified site - Plan: Uric acid  Benign essential HTN  Add atenolol 50 mg by mouth daily and recheck blood pressure in one month. I will also check a fasting lipid panel with a goal LDL cholesterol less than 130. I will check a uric acid level with a goal uric acid level less than 6. Patient received his flu shot today as well. I will  also check a PSA while we are drawing lab work.

## 2015-03-07 NOTE — Addendum Note (Signed)
Addended by: Shary Decamp B on: 03/07/2015 02:06 PM   Modules accepted: Orders

## 2015-03-08 LAB — PSA: PSA: 1.23 ng/mL (ref <=4.00)

## 2015-03-10 ENCOUNTER — Encounter: Payer: Self-pay | Admitting: Family Medicine

## 2015-03-14 ENCOUNTER — Encounter (INDEPENDENT_AMBULATORY_CARE_PROVIDER_SITE_OTHER): Payer: Self-pay

## 2016-02-01 ENCOUNTER — Other Ambulatory Visit: Payer: Self-pay | Admitting: Family Medicine

## 2016-02-01 DIAGNOSIS — I10 Essential (primary) hypertension: Secondary | ICD-10-CM

## 2016-02-21 ENCOUNTER — Other Ambulatory Visit: Payer: Self-pay | Admitting: Family Medicine

## 2016-03-03 ENCOUNTER — Other Ambulatory Visit: Payer: Self-pay | Admitting: Family Medicine

## 2016-03-08 ENCOUNTER — Encounter: Payer: Self-pay | Admitting: Family Medicine

## 2016-03-08 ENCOUNTER — Ambulatory Visit (INDEPENDENT_AMBULATORY_CARE_PROVIDER_SITE_OTHER): Payer: 59 | Admitting: Family Medicine

## 2016-03-08 VITALS — BP 152/100 | HR 60 | Temp 98.1°F | Resp 16 | Ht 75.0 in | Wt 258.0 lb

## 2016-03-08 DIAGNOSIS — J019 Acute sinusitis, unspecified: Secondary | ICD-10-CM | POA: Diagnosis not present

## 2016-03-08 MED ORDER — CEFDINIR 300 MG PO CAPS: 300.0000 mg | ORAL_CAPSULE | Freq: Two times a day (BID) | ORAL | 0 refills | Status: DC

## 2016-03-08 MED ORDER — PREDNISONE 20 MG PO TABS: ORAL_TABLET | ORAL | 0 refills | Status: DC

## 2016-03-08 NOTE — Progress Notes (Signed)
Subjective:    Patient ID: James Santiago, male    DOB: Jun 16, 1950, 66 y.o.   MRN: [id number]  HPI The patient's symptoms began 10 days ago with right frontal sinus pain and pressure, post nasal drip.  He is using Afrin every day he also has pain behind his right eye. He is unable to breathe through his nostril.  He denies fever.  Past Medical History:  Diagnosis Date  . A-fib ([place])    a. Dx 01/2012, CHADS2 = 1.-[facility] in past.  . Adenomatous colon polyp 06/2003  . Arthritis    "fingers" (02/01/2012)  . Biliary dyskinesia   . Cholelithiasis    "still got 2 small stones in there" (02/01/2012)  . Esophageal stricture   . GERD (gastroesophageal reflux disease)   . Gout   . Hemorrhoids   . Hiatal hernia   . Hypercholesteremia   . Hypertension   . Nephrolithiasis    "was told kidney function at 65%"- hx. kidney stones   Past Surgical History:  Procedure Laterality Date  . CARDIOVERSION  03/06/2012   Procedure: CARDIOVERSION;  Surgeon: Larey Dresser, MD;  Location: [facility] ENDOSCOPY;  Service: Cardiovascular;  Laterality: N/A;  . CHOLECYSTECTOMY N/A 11/13/2012   Procedure: LAPAROSCOPIC CHOLECYSTECTOMY;  Surgeon: Rolm Bookbinder, MD;  Location: [facility] ORS;  Service: General;  Laterality: N/A;  . COLONOSCOPY    . CYSTOSCOPY W/ URETEROSCOPY W/ LITHOTRIPSY  1980's  . CYSTOSCOPY WITH RETROGRADE PYELOGRAM, URETEROSCOPY AND STENT PLACEMENT  1980's  . ESOPHAGEAL DILATION    . INGUINAL HERNIA REPAIR  ? 2008   "right" (02/01/2012)  . POLYPECTOMY    . VASECTOMY     Current Outpatient Prescriptions on File Prior to Visit  Medication Sig Dispense Refill  . allopurinol (ZYLOPRIM) 300 MG tablet TAKE 1 TABLET BY MOUTH  EVERY DAY 90 tablet 1  . amLODipine (NORVASC) 10 MG tablet TAKE 1 TABLET BY MOUTH  EVERY DAY 90 tablet 1  . aspirin EC 81 MG tablet Take 1 tablet (81 mg total) by mouth daily. 90 tablet 3  . atenolol (TENORMIN) 50 MG tablet TAKE 1 TABLET BY MOUTH EVERY DAY 175 tablet 0  .  DEXILANT 60 MG capsule TAKE 1 CAPSULE BY MOUTH  DAILY 90 capsule 3  . doxazosin (CARDURA) 4 MG tablet TAKE 1 TABLET BY MOUTH  EVERY DAY 90 tablet 1  . simvastatin (ZOCOR) 10 MG tablet TAKE 1 TABLET BY MOUTH  EVERY NIGHT AT BEDTIME 90 tablet 1  . telmisartan (MICARDIS) 80 MG tablet TAKE 1 TABLET BY MOUTH  EVERY MORNING 90 tablet 1   No current facility-administered medications on file prior to visit.    Allergies  Allergen Reactions  . Augmentin [Amoxicillin-Pot Clavulanate]     Diarrhea and sick   Social History   Social History  . Marital status: Married    Spouse name: N/A  . Number of children: 1  . Years of education: N/A   Occupational History  .  Unemployed   Social History Main Topics  . Smoking status: Former Smoker    Packs/day: 0.50    Years: 12.00    Types: Cigarettes  . Smokeless tobacco: Current User    Types: Chew     Comment: 02/01/2012 smoked cigarettes "probably 30 yr ago."  Currently uses 1/2 pouch of chewing tobacco daily; offered cessation materials; pt declines.  . Alcohol use 0.0 oz/week     Comment: occ. beer  . Drug use: No  . Sexual activity: Not  Currently   Other Topics Concern  . Not on file   Social History Narrative   Lives in [place] with wife.  They have one grown child and a grandson.  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      Review of Systems  All other systems reviewed and are negative.      Objective:   Physical Exam  Constitutional: He appears well-developed and well-nourished.  HENT:  Right Ear: Tympanic membrane, external ear and ear canal normal.  Left Ear: Tympanic membrane, external ear and ear canal normal.  Nose: Mucosal edema and rhinorrhea present. Right sinus exhibits frontal sinus tenderness.  Mouth/Throat: Oropharynx is clear and moist. No oropharyngeal exudate.  Eyes: Conjunctivae are normal.  Neck: Neck supple.  Cardiovascular: Normal rate, regular rhythm and normal heart  sounds.   Pulmonary/Chest: Effort normal and breath sounds normal. No respiratory distress. He has no wheezes. He has no rales.  Lymphadenopathy:    He has no cervical adenopathy.  Vitals reviewed.         Assessment & Plan:  Acute rhinosinusitis - Plan: predniSONE (DELTASONE) 20 MG tablet, cefdinir (OMNICEF) 300 MG capsule  Patient has a sinus infection. Begin Omnicef 300 mg by mouth twice a day for 10 days in addition to a prednisone taper pack. Blood pressure today is extremely high. Patient will check that every day for the next 2 weeks and report those values to me. If persistently elevated we will need to uptitrate his medication. Discontinue afrin

## 2016-04-05 ENCOUNTER — Other Ambulatory Visit: Payer: Self-pay | Admitting: Family Medicine

## 2016-04-05 DIAGNOSIS — I10 Essential (primary) hypertension: Secondary | ICD-10-CM

## 2016-04-05 MED ORDER — AMLODIPINE BESYLATE 10 MG PO TABS: 10.0000 mg | ORAL_TABLET | Freq: Every day | ORAL | 1 refills | Status: DC

## 2016-04-05 MED ORDER — TELMISARTAN 80 MG PO TABS: 80.0000 mg | ORAL_TABLET | Freq: Every morning | ORAL | 1 refills | Status: DC

## 2016-04-05 MED ORDER — DOXAZOSIN MESYLATE 4 MG PO TABS: 4.0000 mg | ORAL_TABLET | Freq: Every day | ORAL | 1 refills | Status: DC

## 2016-04-05 MED ORDER — DEXLANSOPRAZOLE 60 MG PO CPDR: 1.0000 | DELAYED_RELEASE_CAPSULE | Freq: Every day | ORAL | 3 refills | Status: DC

## 2016-04-05 MED ORDER — ATENOLOL 50 MG PO TABS: 50.0000 mg | ORAL_TABLET | Freq: Every day | ORAL | 1 refills | Status: DC

## 2016-04-05 MED ORDER — SIMVASTATIN 10 MG PO TABS: 10.0000 mg | ORAL_TABLET | Freq: Every day | ORAL | 1 refills | Status: DC

## 2016-04-05 MED ORDER — ALLOPURINOL 300 MG PO TABS: 300.0000 mg | ORAL_TABLET | Freq: Every day | ORAL | 1 refills | Status: DC

## 2016-08-22 ENCOUNTER — Encounter: Payer: Self-pay | Admitting: Family Medicine

## 2016-08-27 ENCOUNTER — Ambulatory Visit (INDEPENDENT_AMBULATORY_CARE_PROVIDER_SITE_OTHER): Payer: 59 | Admitting: Family Medicine

## 2016-08-27 ENCOUNTER — Encounter: Payer: Self-pay | Admitting: Family Medicine

## 2016-08-27 VITALS — BP 110/74 | HR 84 | Temp 98.6°F | Ht 75.0 in | Wt 247.0 lb

## 2016-08-27 DIAGNOSIS — I480 Paroxysmal atrial fibrillation: Secondary | ICD-10-CM | POA: Diagnosis not present

## 2016-08-27 DIAGNOSIS — I952 Hypotension due to drugs: Secondary | ICD-10-CM

## 2016-08-27 NOTE — Progress Notes (Signed)
Subjective:    Patient ID: James Santiago, male    DOB: [birth year], 66 y.o.   MRN: [id number]  HPI  Patient has a remote history of paroxysmal atrial fibrillation. Patient works outside in the heat. Since his last office visit, the patient has lost almost 10 pounds. Wt Readings from Last 3 Encounters:  08/27/16 247 lb (112 kg)  03/08/16 258 lb (117 kg)  03/07/15 252 lb (114.3 kg)   He attributes this to sweating and dehydration. As result, his blood pressures have fallen dramatically on July 10, his blood pressure was 90/64. At that time, the patient decided to discontinue his atenolol due to the fact he was having orthostatic hypotension and near syncope. He felt better however he still is having low blood pressure with systolic readings around 092 and diastolic readings around 60. Patient's wife contacted me and together we made the decision to discontinue Cardura as well. Since making this change, the patient's blood pressure has been averaging between 120 and 131/70-87. He feels much better. However on exam today, the patient has a rapid irregularly irregular rhythm and appears to be back in atrial fibrillation. Past Medical History:  Diagnosis Date  . A-fib ([place])    a. Dx 01/2012, CHADS2 = 1.-[facility] in past.  . Adenomatous colon polyp 06/2003  . Arthritis    "fingers" (02/01/2012)  . Biliary dyskinesia   . Cholelithiasis    "still got 2 small stones in there" (02/01/2012)  . Esophageal stricture   . GERD (gastroesophageal reflux disease)   . Gout   . Hemorrhoids   . Hiatal hernia   . Hypercholesteremia   . Hypertension   . Nephrolithiasis    "was told kidney function at 65%"- hx. kidney stones   Past Surgical History:  Procedure Laterality Date  . CARDIOVERSION  03/06/2012   Procedure: CARDIOVERSION;  Surgeon: Larey Dresser, MD;  Location: [facility] ENDOSCOPY;  Service: Cardiovascular;  Laterality: N/A;  . CHOLECYSTECTOMY N/A 11/13/2012   Procedure: LAPAROSCOPIC  CHOLECYSTECTOMY;  Surgeon: Rolm Bookbinder, MD;  Location: [facility] ORS;  Service: General;  Laterality: N/A;  . COLONOSCOPY    . CYSTOSCOPY W/ URETEROSCOPY W/ LITHOTRIPSY  1980's  . CYSTOSCOPY WITH RETROGRADE PYELOGRAM, URETEROSCOPY AND STENT PLACEMENT  1980's  . ESOPHAGEAL DILATION    . INGUINAL HERNIA REPAIR  ? 2008   "right" (02/01/2012)  . POLYPECTOMY    . VASECTOMY     Current Outpatient Prescriptions on File Prior to Visit  Medication Sig Dispense Refill  . allopurinol (ZYLOPRIM) 300 MG tablet Take 1 tablet (300 mg total) by mouth daily. 90 tablet 1  . amLODipine (NORVASC) 10 MG tablet Take 1 tablet (10 mg total) by mouth daily. 90 tablet 1  . aspirin EC 81 MG tablet Take 1 tablet (81 mg total) by mouth daily. 90 tablet 3  . dexlansoprazole (DEXILANT) 60 MG capsule Take 1 capsule (60 mg total) by mouth daily. 90 capsule 3  . simvastatin (ZOCOR) 10 MG tablet Take 1 tablet (10 mg total) by mouth at bedtime. 90 tablet 1  . telmisartan (MICARDIS) 80 MG tablet Take 1 tablet (80 mg total) by mouth every morning. 90 tablet 1  . atenolol (TENORMIN) 50 MG tablet Take 1 tablet (50 mg total) by mouth daily. (Patient not taking: Reported on 08/27/2016) 90 tablet 1  . doxazosin (CARDURA) 4 MG tablet Take 1 tablet (4 mg total) by mouth daily. (Patient not taking: Reported on 08/27/2016) 90 tablet 1   No current facility-administered  medications on file prior to visit.    Allergies  Allergen Reactions  . Augmentin [Amoxicillin-Pot Clavulanate]     Diarrhea and sick   Social History   Social History  . Marital status: Married    Spouse name: N/A  . Number of children: 1  . Years of education: N/A   Occupational History  .  Unemployed   Social History Main Topics  . Smoking status: Former Smoker    Packs/day: 0.50    Years: 12.00    Types: Cigarettes  . Smokeless tobacco: Current User    Types: Chew     Comment: 02/01/2012 smoked cigarettes "probably 30 yr ago."  Currently uses 1/2  pouch of chewing tobacco daily; offered cessation materials; pt declines.  . Alcohol use 0.0 oz/week     Comment: occ. beer  . Drug use: No  . Sexual activity: Not Currently   Other Topics Concern  . Not on file   Social History Narrative   Lives in [place] with wife.  They have one grown child and a grandson.  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     Review of Systems  All other systems reviewed and are negative.      Objective:   Physical Exam  Constitutional: He is oriented to person, place, and time. He appears well-developed and well-nourished.  Neck: Neck supple. No JVD present. No thyromegaly present.  Cardiovascular: An irregularly irregular rhythm present. Tachycardia present.   Pulmonary/Chest: Breath sounds normal. No respiratory distress. He has no wheezes. He has no rales. He exhibits no tenderness.  Abdominal: Soft. Bowel sounds are normal. He exhibits no distension and no mass. There is no tenderness. There is no rebound and no guarding.  Musculoskeletal: Normal range of motion. He exhibits no edema.  Lymphadenopathy:    He has no cervical adenopathy.  Neurological: He is alert and oriented to person, place, and time. No cranial nerve deficit. He exhibits normal muscle tone. Coordination normal.  Vitals reviewed.   CHADS2- 1 CHADS-VASc-1 (2 in December) EKG confirms atrial fibrillation without evidence of ischemia or infarction.      Assessment & Plan:  Paroxysmal atrial fibrillation ([place]) - Plan: CBC with Differential/Platelet, COMPLETE METABOLIC PANEL WITH GFR, TSH, EKG 12-Lead  Hypotension due to drugs - Plan: CBC with Differential/Platelet, COMPLETE METABOLIC PANEL WITH GFR, TSH, EKG 12-Lead  Most likely, the patient has become dehydrated and has lost substantial weight due to dehydration causing low blood pressure. The question is whether or not the atrial fibrillation was triggered by his discontinuation of atenolol or  whether it preceded that. In either regard, the patient has paroxysmal atrial fibrillation. His CHADS-VASc score will be 2 in December, and at that point chronic anticoagulation would be recommended.  Given his rapid heartbeat today, I would discontinue amlodipine and have him resume his atenolol 50 mg a day. I will also check a CBC, CMP, TSH. I would anticoagulate the patient with Eliquis 5 mg pobid and DC aspirin.  Recheck heart rate next week.

## 2016-08-28 LAB — CBC WITH DIFFERENTIAL/PLATELET
BASOS PCT: 1 %
Basophils Absolute: 65 cells/uL (ref 0–200)
Eosinophils Absolute: 65 cells/uL (ref 15–500)
Eosinophils Relative: 1 %
HEMATOCRIT: 48.5 % (ref 38.5–50.0)
HEMOGLOBIN: 16 g/dL (ref 13.0–17.0)
LYMPHS ABS: 1560 {cells}/uL (ref 850–3900)
Lymphocytes Relative: 24 %
MCH: 30.5 pg (ref 27.0–33.0)
MCHC: 33 g/dL (ref 32.0–36.0)
MCV: 92.4 fL (ref 80.0–100.0)
MONO ABS: 585 {cells}/uL (ref 200–950)
MPV: 10.7 fL (ref 7.5–12.5)
Monocytes Relative: 9 %
NEUTROS PCT: 65 %
Neutro Abs: 4225 cells/uL (ref 1500–7800)
Platelets: 208 10*3/uL (ref 140–400)
RBC: 5.25 MIL/uL (ref 4.20–5.80)
RDW: 15.3 % — ABNORMAL HIGH (ref 11.0–15.0)
WBC: 6.5 10*3/uL (ref 3.8–10.8)

## 2016-08-28 LAB — COMPLETE METABOLIC PANEL WITH GFR
ALBUMIN: 4.6 g/dL (ref 3.6–5.1)
ALK PHOS: 119 U/L — AB (ref 40–115)
ALT: 10 U/L (ref 9–46)
AST: 14 U/L (ref 10–35)
BILIRUBIN TOTAL: 2 mg/dL — AB (ref 0.2–1.2)
BUN: 16 mg/dL (ref 7–25)
CALCIUM: 9.9 mg/dL (ref 8.6–10.3)
CO2: 23 mmol/L (ref 20–31)
Chloride: 106 mmol/L (ref 98–110)
Creat: 2.13 mg/dL — ABNORMAL HIGH (ref 0.70–1.25)
GFR, Est African American: 37 mL/min — ABNORMAL LOW (ref >=60)
GFR, Est Non African American: 32 mL/min — ABNORMAL LOW (ref >=60)
Glucose, Bld: 93 mg/dL (ref 70–99)
Potassium: 4.1 mmol/L (ref 3.5–5.3)
Sodium: 140 mmol/L (ref 135–146)
TOTAL PROTEIN: 7 g/dL (ref 6.1–8.1)

## 2016-08-28 LAB — TSH: TSH: 1.64 m[IU]/L (ref 0.40–4.50)

## 2016-09-06 ENCOUNTER — Encounter: Payer: Self-pay | Admitting: Family Medicine

## 2016-09-06 ENCOUNTER — Ambulatory Visit (INDEPENDENT_AMBULATORY_CARE_PROVIDER_SITE_OTHER): Payer: 59 | Admitting: Family Medicine

## 2016-09-06 VITALS — BP 130/88 | HR 74 | Temp 98.4°F | Resp 16 | Ht 75.0 in | Wt 246.0 lb

## 2016-09-06 DIAGNOSIS — I10 Essential (primary) hypertension: Secondary | ICD-10-CM | POA: Diagnosis not present

## 2016-09-06 DIAGNOSIS — I480 Paroxysmal atrial fibrillation: Secondary | ICD-10-CM | POA: Diagnosis not present

## 2016-09-06 DIAGNOSIS — I952 Hypotension due to drugs: Secondary | ICD-10-CM | POA: Diagnosis not present

## 2016-09-06 MED ORDER — APIXABAN 5 MG PO TABS: 5.0000 mg | ORAL_TABLET | Freq: Two times a day (BID) | ORAL | 11 refills | Status: DC

## 2016-09-06 NOTE — Progress Notes (Signed)
Subjective:    Patient ID: James Santiago, male    DOB: 02/20/1950, 65 y.o.   MRN: [id number]  HPI 08/27/16 Patient has a remote history of paroxysmal atrial fibrillation. Patient works outside in the heat. Since his last office visit, the patient has lost almost 10 pounds. Wt Readings from Last 3 Encounters:  08/27/16 247 lb (112 kg)  03/08/16 258 lb (117 kg)  03/07/15 252 lb (114.3 kg)   He attributes this to sweating and dehydration. As result, his blood pressures have fallen dramatically on July 10, his blood pressure was 90/64. At that time, the patient decided to discontinue his atenolol due to the fact he was having orthostatic hypotension and near syncope. He felt better however he still is having low blood pressure with systolic readings around 962 and diastolic readings around 60. Patient's wife contacted me and together we made the decision to discontinue Cardura as well. Since making this change, the patient's blood pressure has been averaging between 120 and 131/70-87. He feels much better. However on exam today, the patient has a rapid irregularly irregular rhythm and appears to be back in atrial fibrillation.  At that time, my plan was: Most likely, the patient has become dehydrated and has lost substantial weight due to dehydration causing low blood pressure. The question is whether or not the atrial fibrillation was triggered by his discontinuation of atenolol or whether it preceded that. In either regard, the patient has paroxysmal atrial fibrillation. His CHADS-VASc score will be 2 in December, and at that point chronic anticoagulation would be recommended.  Given his rapid heartbeat today, I would discontinue amlodipine and have him resume his atenolol 50 mg a day. I will also check a CBC, CMP, TSH. I would anticoagulate the patient with Eliquis 5 mg pobid and DC aspirin.  Recheck heart rate next week.    09/06/16 Lab work last time was significant for an elevation in his  creatinine from 1.3-2.3. This represents a drop in his renal function of almost 50%. Over the last week he is tried to stay hydrated. He is making a conscious effort to try to drink more fluid. His blood pressure has definitely improved. His systolic blood pressures between 120 and 130 over 80s. However the patient remains in atrial fibrillation today. He is unable to feel the abnormal rhythm. However his heart rate is adequately controlled. I calculated his heart rate to be in the 70s. From a symptom standpoint, the patient is back to his baseline. Office Visit on 08/27/2016  Component Date Value Ref Range Status  . WBC 08/27/2016 6.5  3.8 - 10.8 K/uL Final  . RBC 08/27/2016 5.25  4.20 - 5.80 MIL/uL Final  . Hemoglobin 08/27/2016 16.0  13.0 - 17.0 g/dL Final  . HCT 08/27/2016 48.5  38.5 - 50.0 % Final  . MCV 08/27/2016 92.4  80.0 - 100.0 fL Final  . MCH 08/27/2016 30.5  27.0 - 33.0 pg Final  . MCHC 08/27/2016 33.0  32.0 - 36.0 g/dL Final  . RDW 08/27/2016 15.3* 11.0 - 15.0 % Final  . Platelets 08/27/2016 208  140 - 400 K/uL Final  . MPV 08/27/2016 10.7  7.5 - 12.5 fL Final  . Neutro Abs 08/27/2016 4225  1,500 - 7,800 cells/uL Final  . Lymphs Abs 08/27/2016 1560  850 - 3,900 cells/uL Final  . Monocytes Absolute 08/27/2016 585  200 - 950 cells/uL Final  . Eosinophils Absolute 08/27/2016 65  15 - 500 cells/uL Final  .  Basophils Absolute 08/27/2016 65  0 - 200 cells/uL Final  . Neutrophils Relative % 08/27/2016 65  % Final  . Lymphocytes Relative 08/27/2016 24  % Final  . Monocytes Relative 08/27/2016 9  % Final  . Eosinophils Relative 08/27/2016 1  % Final  . Basophils Relative 08/27/2016 1  % Final  . Smear Review 08/27/2016 Criteria for review not met   Final  . Sodium 08/27/2016 140  135 - 146 mmol/L Final  . Potassium 08/27/2016 4.1  3.5 - 5.3 mmol/L Final  . Chloride 08/27/2016 106  98 - 110 mmol/L Final  . CO2 08/27/2016 23  20 - 31 mmol/L Final  . Glucose, Bld 08/27/2016 93  70 - 99  mg/dL Final  . BUN 08/27/2016 16  7 - 25 mg/dL Final  . Creat 08/27/2016 2.13* 0.70 - 1.25 mg/dL Final   Comment:   For patients > or = 65 years of age: The upper reference limit for Creatinine is approximately 13% higher for people identified as African-American.     . Total Bilirubin 08/27/2016 2.0* 0.2 - 1.2 mg/dL Final  . Alkaline Phosphatase 08/27/2016 119* 40 - 115 U/L Final  . AST 08/27/2016 14  10 - 35 U/L Final  . ALT 08/27/2016 10  9 - 46 U/L Final  . Total Protein 08/27/2016 7.0  6.1 - 8.1 g/dL Final  . Albumin 08/27/2016 4.6  3.6 - 5.1 g/dL Final  . Calcium 08/27/2016 9.9  8.6 - 10.3 mg/dL Final  . GFR, Est African American 08/27/2016 37* >=60 mL/min Final  . GFR, Est Non African American 08/27/2016 32* >=60 mL/min Final  . TSH 08/27/2016 1.64  0.40 - 4.50 mIU/L Final    Past Medical History:  Diagnosis Date  . A-fib ([place])    a. Dx 01/2012, CHADS2 = 1.-[facility] in past.  . Adenomatous colon polyp 06/2003  . Arthritis    "fingers" (02/01/2012)  . Biliary dyskinesia   . Cholelithiasis    "still got 2 small stones in there" (02/01/2012)  . Esophageal stricture   . GERD (gastroesophageal reflux disease)   . Gout   . Hemorrhoids   . Hiatal hernia   . Hypercholesteremia   . Hypertension   . Nephrolithiasis    "was told kidney function at 65%"- hx. kidney stones   Past Surgical History:  Procedure Laterality Date  . CARDIOVERSION  03/06/2012   Procedure: CARDIOVERSION;  Surgeon: Larey Dresser, MD;  Location: [facility] ENDOSCOPY;  Service: Cardiovascular;  Laterality: N/A;  . CHOLECYSTECTOMY N/A 11/13/2012   Procedure: LAPAROSCOPIC CHOLECYSTECTOMY;  Surgeon: Rolm Bookbinder, MD;  Location: [facility] ORS;  Service: General;  Laterality: N/A;  . COLONOSCOPY    . CYSTOSCOPY W/ URETEROSCOPY W/ LITHOTRIPSY  1980's  . CYSTOSCOPY WITH RETROGRADE PYELOGRAM, URETEROSCOPY AND STENT PLACEMENT  1980's  . ESOPHAGEAL DILATION    . INGUINAL HERNIA REPAIR  ? 2008   "right" (02/01/2012)  .  POLYPECTOMY    . VASECTOMY     Current Outpatient Prescriptions on File Prior to Visit  Medication Sig Dispense Refill  . allopurinol (ZYLOPRIM) 300 MG tablet Take 1 tablet (300 mg total) by mouth daily. 90 tablet 1  . atenolol (TENORMIN) 50 MG tablet Take 1 tablet (50 mg total) by mouth daily. 90 tablet 1  . dexlansoprazole (DEXILANT) 60 MG capsule Take 1 capsule (60 mg total) by mouth daily. 90 capsule 3  . simvastatin (ZOCOR) 10 MG tablet Take 1 tablet (10 mg total) by mouth at bedtime. [place]  tablet 1  . telmisartan (MICARDIS) 80 MG tablet Take 1 tablet (80 mg total) by mouth every morning. 90 tablet 1  . amLODipine (NORVASC) 10 MG tablet Take 1 tablet (10 mg total) by mouth daily. (Patient not taking: Reported on 09/06/2016) 90 tablet 1  . aspirin EC 81 MG tablet Take 1 tablet (81 mg total) by mouth daily. (Patient not taking: Reported on 09/06/2016) 90 tablet 3  . doxazosin (CARDURA) 4 MG tablet Take 1 tablet (4 mg total) by mouth daily. (Patient not taking: Reported on 08/27/2016) 90 tablet 1   No current facility-administered medications on file prior to visit.    Allergies  Allergen Reactions  . Augmentin [Amoxicillin-Pot Clavulanate]     Diarrhea and sick   Social History   Social History  . Marital status: Married    Spouse name: N/A  . Number of children: 1  . Years of education: N/A   Occupational History  .  Unemployed   Social History Main Topics  . Smoking status: Former Smoker    Packs/day: 0.50    Years: 12.00    Types: Cigarettes  . Smokeless tobacco: Current User    Types: Chew     Comment: 02/01/2012 smoked cigarettes "probably 30 yr ago."  Currently uses 1/2 pouch of chewing tobacco daily; offered cessation materials; pt declines.  . Alcohol use 0.0 oz/week     Comment: occ. beer  . Drug use: No  . Sexual activity: Not Currently   Other Topics Concern  . Not on file   Social History Narrative   Lives in [place] with wife.  They have one grown child and a  grandson.  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     Review of Systems  All other systems reviewed and are negative.      Objective:   Physical Exam  Constitutional: He is oriented to person, place, and time. He appears well-developed and well-nourished.  Neck: Neck supple. No JVD present. No thyromegaly present.  Cardiovascular: An irregularly irregular rhythm present. Tachycardia present.   Pulmonary/Chest: Breath sounds normal. No respiratory distress. He has no wheezes. He has no rales. He exhibits no tenderness.  Abdominal: Soft. Bowel sounds are normal. He exhibits no distension and no mass. There is no tenderness. There is no rebound and no guarding.  Musculoskeletal: Normal range of motion. He exhibits no edema.  Lymphadenopathy:    He has no cervical adenopathy.  Neurological: He is alert and oriented to person, place, and time. No cranial nerve deficit. He exhibits normal muscle tone. Coordination normal.  Vitals reviewed.   CHADS2- 1 CHADS-VASc-1 (2 in December)     Assessment & Plan:  Paroxysmal atrial fibrillation (HCC)  Hypotension due to drugs  Benign essential HTN Patient is still in atrial fibrillation. We discussed his options including rate control versus rhythm control. Patient is artery undergone cardioversion one time in the rhythm returned. However he has not tried antiarrhythmic drugs. At the present time, the patient would like to continue with rate control unless he develops problems controlling his heart rate. Therefore he defers referral back to cardiology. We will continue his atenolol. We will also continue him on eliquis milligrams twice daily. He will call me if his blood pressure rises above 140/90. He will call me if his heart rate rises above 90 bpm. I will recheck his renal function today. Hopefully this will be improved with better hydration.

## 2016-09-07 LAB — BASIC METABOLIC PANEL WITH GFR
BUN: 23 mg/dL (ref 7–25)
CHLORIDE: 106 mmol/L (ref 98–110)
CO2: 23 mmol/L (ref 20–31)
Calcium: 9.7 mg/dL (ref 8.6–10.3)
Creat: 1.87 mg/dL — ABNORMAL HIGH (ref 0.70–1.25)
GFR, EST NON AFRICAN AMERICAN: 37 mL/min — AB (ref >=60)
GFR, Est African American: 43 mL/min — ABNORMAL LOW (ref >=60)
GLUCOSE: 86 mg/dL (ref 70–99)
POTASSIUM: 4.1 mmol/L (ref 3.5–5.3)
Sodium: 139 mmol/L (ref 135–146)

## 2016-09-10 ENCOUNTER — Encounter: Payer: Self-pay | Admitting: Family Medicine

## 2016-09-10 MED ORDER — APIXABAN 5 MG PO TABS: 5.0000 mg | ORAL_TABLET | Freq: Two times a day (BID) | ORAL | 3 refills | Status: DC

## 2016-09-14 ENCOUNTER — Telehealth: Payer: Self-pay | Admitting: Family Medicine

## 2016-09-14 MED ORDER — DOXAZOSIN MESYLATE 4 MG PO TABS: 4.0000 mg | ORAL_TABLET | Freq: Every day | ORAL | 1 refills | Status: DC

## 2016-09-14 NOTE — Telephone Encounter (Signed)
Patient aware of providers recommendations and has some at home, does not need rx

## 2016-09-14 NOTE — Telephone Encounter (Signed)
Could resume cardura/doxazosin.

## 2016-09-14 NOTE — Telephone Encounter (Signed)
Pt is having issues with high bp please call him back we recently changed his bp med, and is about to go out of town.

## 2016-09-14 NOTE — Telephone Encounter (Signed)
Called pt and he states that his BP is rising and wants to know if you want him to restart one of the meds you took him off of??? BP average 145/98

## 2016-10-11 ENCOUNTER — Other Ambulatory Visit: Payer: Self-pay | Admitting: Family Medicine

## 2016-10-11 DIAGNOSIS — I10 Essential (primary) hypertension: Secondary | ICD-10-CM

## 2016-10-14 ENCOUNTER — Encounter: Payer: Self-pay | Admitting: Family Medicine

## 2016-10-22 ENCOUNTER — Encounter: Payer: Self-pay | Admitting: Family Medicine

## 2016-10-22 ENCOUNTER — Ambulatory Visit (INDEPENDENT_AMBULATORY_CARE_PROVIDER_SITE_OTHER): Payer: 59 | Admitting: Family Medicine

## 2016-10-22 VITALS — BP 108/62 | HR 88 | Temp 98.3°F | Resp 16 | Ht 75.0 in | Wt 241.0 lb

## 2016-10-22 DIAGNOSIS — I10 Essential (primary) hypertension: Secondary | ICD-10-CM | POA: Diagnosis not present

## 2016-10-22 DIAGNOSIS — I952 Hypotension due to drugs: Secondary | ICD-10-CM

## 2016-10-22 DIAGNOSIS — I480 Paroxysmal atrial fibrillation: Secondary | ICD-10-CM

## 2016-10-22 NOTE — Progress Notes (Signed)
Subjective:    Patient ID: James Santiago, male    DOB: 08-08-1950, 66 y.o.   MRN: [id number]  HPI 08/27/16 Patient has a remote history of paroxysmal atrial fibrillation. Patient works outside in the heat. Since his last office visit, the patient has lost almost 10 pounds. Wt Readings from Last 3 Encounters:  10/22/16 241 lb (109.3 kg)  09/06/16 246 lb (111.6 kg)  08/27/16 247 lb (112 kg)   He attributes this to sweating and dehydration. As result, his blood pressures have fallen dramatically on July 10, his blood pressure was 90/64. At that time, the patient decided to discontinue his atenolol due to the fact he was having orthostatic hypotension and near syncope. He felt better however he still is having low blood pressure with systolic readings around 462 and diastolic readings around 60. Patient's wife contacted me and together we made the decision to discontinue Cardura as well. Since making this change, the patient's blood pressure has been averaging between 120 and 131/70-87. He feels much better. However on exam today, the patient has a rapid irregularly irregular rhythm and appears to be back in atrial fibrillation.  At that time, my plan was: Most likely, the patient has become dehydrated and has lost substantial weight due to dehydration causing low blood pressure. The question is whether or not the atrial fibrillation was triggered by his discontinuation of atenolol or whether it preceded that. In either regard, the patient has paroxysmal atrial fibrillation. His CHADS-VASc score will be 2 in December, and at that point chronic anticoagulation would be recommended.  Given his rapid heartbeat today, I would discontinue amlodipine and have him resume his atenolol 50 mg a day. I will also check a CBC, CMP, TSH. I would anticoagulate the patient with Eliquis 5 mg pobid and DC aspirin.  Recheck heart rate next week.    09/06/16 Lab work last time was significant for an elevation in his  creatinine from 1.3-2.3. This represents a drop in his renal function of almost 50%. Over the last week he is tried to stay hydrated. He is making a conscious effort to try to drink more fluid. His blood pressure has definitely improved. His systolic blood pressures between 120 and 130 over 80s. However the patient remains in atrial fibrillation today. He is unable to feel the abnormal rhythm. However his heart rate is adequately controlled. I calculated his heart rate to be in the 70s. From a symptom standpoint, the patient is back to his baseline.  At that time, my plan was: Patient is still in atrial fibrillation. We discussed his options including rate control versus rhythm control. Patient is artery undergone cardioversion one time in the rhythm returned. However he has not tried antiarrhythmic drugs. At the present time, the patient would like to continue with rate control unless he develops problems controlling his heart rate. Therefore he defers referral back to cardiology. We will continue his atenolol. We will also continue him on eliquis milligrams twice daily. He will call me if his blood pressure rises above 140/90. He will call me if his heart rate rises above 90 bpm. I will recheck his renal function today. Hopefully this will be improved with better hydration. No visits with results within 1 Month(s) from this visit.  Latest known visit with results is:  Office Visit on 09/06/2016  Component Date Value Ref Range Status  . Sodium 09/06/2016 139  135 - 146 mmol/L Final  . Potassium 09/06/2016 4.1  3.5 -  5.3 mmol/L Final  . Chloride 09/06/2016 106  98 - 110 mmol/L Final  . CO2 09/06/2016 23  20 - 31 mmol/L Final  . Glucose, Bld 09/06/2016 86  70 - 99 mg/dL Final  . BUN 09/06/2016 23  7 - 25 mg/dL Final  . Creat 09/06/2016 1.87* 0.70 - 1.25 mg/dL Final   Comment:   For patients > or = 65 years of age: The upper reference limit for Creatinine is approximately 13% higher for people  identified as African-American.     . Calcium 09/06/2016 9.7  8.6 - 10.3 mg/dL Final  . GFR, Est African American 09/06/2016 43* >=60 mL/min Final  . GFR, Est Non African American 09/06/2016 37* >=60 mL/min Final   10/22/16 Please see the email communication from the patient last week. His blood pressure has been averaging between 99 and 112/60-70. Although he is asymptomatic, he is concerned that his blood pressure is too low. I agree with him. I recommended that the patient discontinue Cardura. Even off the Cardura, his blood pressure continues to remain 637-858 systolic although he is completely asymptomatic. Of note he continues to lose weight: Wt Readings from Last 3 Encounters:  10/22/16 241 lb (109.3 kg)  09/06/16 246 lb (111.6 kg)  08/27/16 247 lb (112 kg)   Patient is adamant that he feels fine. He denies any other symptoms. There are no constitutional symptoms or symptoms concerning for malignancy. He attributes this to sweating profusely at work and not drinking and eating enough. Furthermore he is trying to eat healthier. At the present time we agreed just monitor it unless the weight loss persists  Past Medical History:  Diagnosis Date  . A-fib ([place])    a. Dx 01/2012, CHADS2 = 1.-[facility] in past.  . Adenomatous colon polyp 06/2003  . Arthritis    "fingers" (02/01/2012)  . Biliary dyskinesia   . Cholelithiasis    "still got 2 small stones in there" (02/01/2012)  . Esophageal stricture   . GERD (gastroesophageal reflux disease)   . Gout   . Hemorrhoids   . Hiatal hernia   . Hypercholesteremia   . Hypertension   . Nephrolithiasis    "was told kidney function at 65%"- hx. kidney stones   Past Surgical History:  Procedure Laterality Date  . CARDIOVERSION  03/06/2012   Procedure: CARDIOVERSION;  Surgeon: Larey Dresser, MD;  Location: [facility] ENDOSCOPY;  Service: Cardiovascular;  Laterality: N/A;  . CHOLECYSTECTOMY N/A 11/13/2012   Procedure: LAPAROSCOPIC CHOLECYSTECTOMY;   Surgeon: Rolm Bookbinder, MD;  Location: [facility] ORS;  Service: General;  Laterality: N/A;  . COLONOSCOPY    . CYSTOSCOPY W/ URETEROSCOPY W/ LITHOTRIPSY  1980's  . CYSTOSCOPY WITH RETROGRADE PYELOGRAM, URETEROSCOPY AND STENT PLACEMENT  1980's  . ESOPHAGEAL DILATION    . INGUINAL HERNIA REPAIR  ? 2008   "right" (02/01/2012)  . POLYPECTOMY    . VASECTOMY     Current Outpatient Prescriptions on File Prior to Visit  Medication Sig Dispense Refill  . allopurinol (ZYLOPRIM) 300 MG tablet Take 1 tablet (300 mg total) by mouth daily. 90 tablet 1  . apixaban (ELIQUIS) 5 MG TABS tablet Take 1 tablet (5 mg total) by mouth 2 (two) times daily. 180 tablet 3  . atenolol (TENORMIN) 50 MG tablet TAKE 1 TABLET BY MOUTH  DAILY 90 tablet 3  . dexlansoprazole (DEXILANT) 60 MG capsule Take 1 capsule (60 mg total) by mouth daily. 90 capsule 3  . simvastatin (ZOCOR) 10 MG tablet Take 1 tablet (  10 mg total) by mouth at bedtime. 90 tablet 1  . telmisartan (MICARDIS) 80 MG tablet Take 1 tablet (80 mg total) by mouth every morning. 90 tablet 1   No current facility-administered medications on file prior to visit.    Allergies  Allergen Reactions  . Augmentin [Amoxicillin-Pot Clavulanate]     Diarrhea and sick   Social History   Social History  . Marital status: Married    Spouse name: N/A  . Number of children: 1  . Years of education: N/A   Occupational History  .  Unemployed   Social History Main Topics  . Smoking status: Former Smoker    Packs/day: 0.50    Years: 12.00    Types: Cigarettes  . Smokeless tobacco: Current User    Types: Chew     Comment: 02/01/2012 smoked cigarettes "probably 30 yr ago."  Currently uses 1/2 pouch of chewing tobacco daily; offered cessation materials; pt declines.  . Alcohol use 0.0 oz/week     Comment: occ. beer  . Drug use: No  . Sexual activity: Not Currently   Other Topics Concern  . Not on file   Social History Narrative   Lives in [place] with wife.  They  have one grown child and a grandson.  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     Review of Systems  All other systems reviewed and are negative.      Objective:   Physical Exam  Constitutional: He is oriented to person, place, and time. He appears well-developed and well-nourished.  Neck: Neck supple. No JVD present. No thyromegaly present.  Cardiovascular: An irregularly irregular rhythm present.  Pulmonary/Chest: Breath sounds normal. No respiratory distress. He has no wheezes. He has no rales. He exhibits no tenderness.  Abdominal: Soft. Bowel sounds are normal. He exhibits no distension and no mass. There is no tenderness. There is no rebound and no guarding.  Musculoskeletal: Normal range of motion. He exhibits no edema.  Lymphadenopathy:    He has no cervical adenopathy.  Neurological: He is alert and oriented to person, place, and time. No cranial nerve deficit. He exhibits normal muscle tone. Coordination normal.  Vitals reviewed.        Assessment & Plan:  Benign essential HTN - Plan: CBC with Differential/Platelet, BASIC METABOLIC PANEL WITH GFR  Paroxysmal atrial fibrillation (HCC)  Hypotension due to drugs  Stay off Cardura. Decrease mycardis to 40 mg a day.  Recheck BP in 1 month.  Also monitor renal fcn and cbc.

## 2016-10-23 LAB — CBC WITH DIFFERENTIAL/PLATELET
BASOS PCT: 0.9 %
Basophils Absolute: 51 cells/uL (ref 0–200)
Eosinophils Absolute: 211 cells/uL (ref 15–500)
Eosinophils Relative: 3.7 %
HEMATOCRIT: 47.2 % (ref 38.5–50.0)
HEMOGLOBIN: 15.9 g/dL (ref 13.2–17.1)
LYMPHS ABS: 1790 {cells}/uL (ref 850–3900)
MCH: 30.8 pg (ref 27.0–33.0)
MCHC: 33.7 g/dL (ref 32.0–36.0)
MCV: 91.3 fL (ref 80.0–100.0)
MPV: 11.6 fL (ref 7.5–12.5)
Monocytes Relative: 8.9 %
NEUTROS ABS: 3141 {cells}/uL (ref 1500–7800)
Neutrophils Relative %: 55.1 %
Platelets: 177 10*3/uL (ref 140–400)
RBC: 5.17 10*6/uL (ref 4.20–5.80)
RDW: 14.2 % (ref 11.0–15.0)
TOTAL LYMPHOCYTE: 31.4 %
WBC: 5.7 10*3/uL (ref 3.8–10.8)
WBCMIX: 507 {cells}/uL (ref 200–950)

## 2016-10-23 LAB — BASIC METABOLIC PANEL WITH GFR
BUN/Creatinine Ratio: 16 (calc) (ref 6–22)
BUN: 27 mg/dL — ABNORMAL HIGH (ref 7–25)
CALCIUM: 9.4 mg/dL (ref 8.6–10.3)
CO2: 21 mmol/L (ref 20–32)
Chloride: 108 mmol/L (ref 98–110)
Creat: 1.69 mg/dL — ABNORMAL HIGH (ref 0.70–1.25)
GFR, EST NON AFRICAN AMERICAN: 42 mL/min/{1.73_m2} — AB (ref >=60)
GFR, Est African American: 49 mL/min/{1.73_m2} — ABNORMAL LOW (ref >=60)
GLUCOSE: 101 mg/dL — AB (ref 65–99)
POTASSIUM: 4.1 mmol/L (ref 3.5–5.3)
SODIUM: 139 mmol/L (ref 135–146)

## 2016-11-08 ENCOUNTER — Other Ambulatory Visit: Payer: Self-pay | Admitting: Family Medicine

## 2016-11-09 ENCOUNTER — Encounter: Payer: Self-pay | Admitting: Family Medicine

## 2016-11-12 NOTE — Telephone Encounter (Signed)
Refill appropriate 

## 2017-04-15 ENCOUNTER — Other Ambulatory Visit: Payer: Self-pay | Admitting: Family Medicine

## 2017-04-18 ENCOUNTER — Encounter: Payer: Self-pay | Admitting: Family Medicine

## 2017-04-18 ENCOUNTER — Ambulatory Visit (INDEPENDENT_AMBULATORY_CARE_PROVIDER_SITE_OTHER): Payer: Medicare Other | Admitting: Family Medicine

## 2017-04-18 VITALS — BP 136/92 | HR 76 | Temp 97.6°F | Resp 16 | Ht 75.0 in | Wt 232.0 lb

## 2017-04-18 DIAGNOSIS — K219 Gastro-esophageal reflux disease without esophagitis: Secondary | ICD-10-CM

## 2017-04-18 DIAGNOSIS — R1013 Epigastric pain: Secondary | ICD-10-CM | POA: Diagnosis not present

## 2017-04-18 DIAGNOSIS — R1032 Left lower quadrant pain: Secondary | ICD-10-CM

## 2017-04-18 DIAGNOSIS — R319 Hematuria, unspecified: Secondary | ICD-10-CM

## 2017-04-18 DIAGNOSIS — R634 Abnormal weight loss: Secondary | ICD-10-CM | POA: Diagnosis not present

## 2017-04-18 LAB — URINALYSIS, ROUTINE W REFLEX MICROSCOPIC
BACTERIA UA: NONE SEEN /HPF
Bilirubin Urine: NEGATIVE
GLUCOSE, UA: NEGATIVE
Leukocytes, UA: NEGATIVE
Nitrite: NEGATIVE
PH: 5.5 (ref 5.0–8.0)
Specific Gravity, Urine: 1.025 (ref 1.001–1.03)
WBC, UA: NONE SEEN /HPF (ref 0–5)

## 2017-04-18 LAB — MICROSCOPIC MESSAGE

## 2017-04-18 MED ORDER — HYDROCODONE-ACETAMINOPHEN 5-325 MG PO TABS: 1.0000 | ORAL_TABLET | ORAL | 0 refills | Status: DC | PRN

## 2017-04-18 NOTE — Progress Notes (Signed)
Subjective:    Patient ID: James Santiago, male    DOB: [birth year], 67 y.o.   MRN: [id number]  HPI  Since I last saw the patient he has lost no 9 pounds. He states that he is heartburn on a daily basis despite taking dexilant.  He is also taking Zantac at night. Frequently he has to take 1 or 2 Gas-X tablets at night due to bloating, indigestion, and increased gas. He is avoiding many foods due to the reflux and indigestion but they're causing and as a result he has lost 9 pounds. He denies any blood in his stool. He denies any melena. He does report epigastric discomfort. Also he reports gross hematuria 24 hours. Urinalysis is significant for frank blood. He denies any dysuria but he does have some pain radiating into his left lower quadrant. He has a history of uric acid kidney stones in the past Past Medical History:  Diagnosis Date  . A-fib ([place])    a. Dx 01/2012, CHADS2 = 1.-[facility] in past.  . Adenomatous colon polyp 06/2003  . Arthritis    "fingers" (02/01/2012)  . Biliary dyskinesia   . Cholelithiasis    "still got 2 small stones in there" (02/01/2012)  . Esophageal stricture   . GERD (gastroesophageal reflux disease)   . Gout   . Hemorrhoids   . Hiatal hernia   . Hypercholesteremia   . Hypertension   . Nephrolithiasis    "was told kidney function at 65%"- hx. kidney stones   Past Surgical History:  Procedure Laterality Date  . CARDIOVERSION  03/06/2012   Procedure: CARDIOVERSION;  Surgeon: Larey Dresser, MD;  Location: [facility] ENDOSCOPY;  Service: Cardiovascular;  Laterality: N/A;  . CHOLECYSTECTOMY N/A 11/13/2012   Procedure: LAPAROSCOPIC CHOLECYSTECTOMY;  Surgeon: Rolm Bookbinder, MD;  Location: [facility] ORS;  Service: General;  Laterality: N/A;  . COLONOSCOPY    . CYSTOSCOPY W/ URETEROSCOPY W/ LITHOTRIPSY  1980's  . CYSTOSCOPY WITH RETROGRADE PYELOGRAM, URETEROSCOPY AND STENT PLACEMENT  1980's  . ESOPHAGEAL DILATION    . INGUINAL HERNIA REPAIR  ? 2008   "right"  (02/01/2012)  . POLYPECTOMY    . VASECTOMY     Current Outpatient Medications on File Prior to Visit  Medication Sig Dispense Refill  . allopurinol (ZYLOPRIM) 300 MG tablet TAKE 1 TABLET BY MOUTH  DAILY 90 tablet 1  . apixaban (ELIQUIS) 5 MG TABS tablet Take 1 tablet (5 mg total) by mouth 2 (two) times daily. 180 tablet 3  . atenolol (TENORMIN) 50 MG tablet TAKE 1 TABLET BY MOUTH  DAILY 90 tablet 3  . dexlansoprazole (DEXILANT) 60 MG capsule Take 1 capsule (60 mg total) by mouth daily. 90 capsule 3  . doxazosin (CARDURA) 4 MG tablet TAKE 1 TABLET BY MOUTH  DAILY 90 tablet 1  . simvastatin (ZOCOR) 10 MG tablet TAKE 1 TABLET BY MOUTH AT  BEDTIME 90 tablet 1  . telmisartan (MICARDIS) 80 MG tablet TAKE 1 TABLET BY MOUTH  EVERY MORNING 90 tablet 1   No current facility-administered medications on file prior to visit.    Allergies  Allergen Reactions  . Augmentin [Amoxicillin-Pot Clavulanate]     Diarrhea and sick   Social History   Socioeconomic History  . Marital status: Married    Spouse name: Not on file  . Number of children: 1  . Years of education: Not on file  . Highest education level: Not on file  Social Needs  . Financial resource strain: Not on  file  . Food insecurity - worry: Not on file  . Food insecurity - inability: Not on file  . Transportation needs - medical: Not on file  . Transportation needs - non-medical: Not on file  Occupational History    Employer: UNEMPLOYED  Tobacco Use  . Smoking status: Former Smoker    Packs/day: 0.50    Years: 12.00    Pack years: 6.00    Types: Cigarettes  . Smokeless tobacco: Current User    Types: Chew  . Tobacco comment: 02/01/2012 smoked cigarettes "probably 30 yr ago."  Currently uses 1/2 pouch of chewing tobacco daily; offered cessation materials; pt declines.  Substance and Sexual Activity  . Alcohol use: Yes    Alcohol/week: 0.0 oz    Comment: occ. beer  . Drug use: No  . Sexual activity: Not Currently  Other  Topics Concern  . Not on file  Social History Narrative   Lives in [place] with wife.  They have one grown child and a grandson.  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     Review of Systems  All other systems reviewed and are negative.      Objective:   Physical Exam  Constitutional: He is oriented to person, place, and time. He appears well-developed and well-nourished.  Neck: Neck supple. No JVD present. No thyromegaly present.  Cardiovascular: An irregularly irregular rhythm present.  Pulmonary/Chest: Breath sounds normal. No respiratory distress. He has no wheezes. He has no rales. He exhibits no tenderness.  Abdominal: Soft. Bowel sounds are normal. He exhibits no distension and no mass. There is no tenderness. There is no rebound and no guarding.  Musculoskeletal: Normal range of motion. He exhibits no edema.  Lymphadenopathy:    He has no cervical adenopathy.  Neurological: He is alert and oriented to person, place, and time. No cranial nerve deficit. He exhibits normal muscle tone. Coordination normal.  Vitals reviewed.        Assessment & Plan:  Hematuria, unspecified type - Plan: Urinalysis, Routine w reflex microscopic, CT RENAL STONE STUDY  Dyspepsia - Plan: CBC with Differential/Platelet, COMPLETE METABOLIC PANEL WITH GFR, Lipase, H. pylori breath test, Ambulatory referral to Gastroenterology  Gastroesophageal reflux disease without esophagitis - Plan: Ambulatory referral to Gastroenterology  Weight loss - Plan: Ambulatory referral to Gastroenterology  Left lower quadrant pain - Plan: CT RENAL STONE STUDY  Patient has very recalcitrant acid reflux and now weight loss. I have recommended that he take his proton pump inhibitor in the morning with food to improve absorption. I will consult GI for EGD to rule out gastric malignancy or ulcer. I will perform an H. pylori breath test today to evaluate for Helicobacter pylori. Regarding his  hematuria, I'll obtain a CT scan of the abdomen and pelvis to evaluate for kidney stone. Patient is already on an alpha-blocker in the form of Cardura to facilitate stone passage. I will give him Norco 5/325 one to 2 tablets every 4 hours as needed for severe pain. Recommended he push fluids.

## 2017-04-19 LAB — CBC WITH DIFFERENTIAL/PLATELET
BASOS PCT: 1 %
Basophils Absolute: 51 cells/uL (ref 0–200)
Eosinophils Absolute: 92 cells/uL (ref 15–500)
Eosinophils Relative: 1.8 %
HEMATOCRIT: 47.3 % (ref 38.5–50.0)
HEMOGLOBIN: 16.1 g/dL (ref 13.2–17.1)
LYMPHS ABS: 1091 {cells}/uL (ref 850–3900)
MCH: 30.1 pg (ref 27.0–33.0)
MCHC: 34 g/dL (ref 32.0–36.0)
MCV: 88.6 fL (ref 80.0–100.0)
MPV: 11.7 fL (ref 7.5–12.5)
Monocytes Relative: 8.2 %
NEUTROS ABS: 3448 {cells}/uL (ref 1500–7800)
NEUTROS PCT: 67.6 %
Platelets: 159 10*3/uL (ref 140–400)
RBC: 5.34 10*6/uL (ref 4.20–5.80)
RDW: 14.3 % (ref 11.0–15.0)
Total Lymphocyte: 21.4 %
WBC: 5.1 10*3/uL (ref 3.8–10.8)
WBCMIX: 418 {cells}/uL (ref 200–950)

## 2017-04-19 LAB — COMPLETE METABOLIC PANEL WITH GFR
AG Ratio: 2 (calc) (ref 1.0–2.5)
ALBUMIN MSPROF: 4.4 g/dL (ref 3.6–5.1)
ALT: 14 U/L (ref 9–46)
AST: 17 U/L (ref 10–35)
Alkaline phosphatase (APISO): 100 U/L (ref 40–115)
BUN / CREAT RATIO: 6 (calc) (ref 6–22)
BUN: 11 mg/dL (ref 7–25)
CALCIUM: 9.8 mg/dL (ref 8.6–10.3)
CO2: 27 mmol/L (ref 20–32)
CREATININE: 1.73 mg/dL — AB (ref 0.70–1.25)
Chloride: 105 mmol/L (ref 98–110)
GFR, EST AFRICAN AMERICAN: 47 mL/min/{1.73_m2} — AB (ref >=60)
GFR, EST NON AFRICAN AMERICAN: 41 mL/min/{1.73_m2} — AB (ref >=60)
GLOBULIN: 2.2 g/dL (ref 1.9–3.7)
Glucose, Bld: 96 mg/dL (ref 65–99)
POTASSIUM: 3.9 mmol/L (ref 3.5–5.3)
SODIUM: 143 mmol/L (ref 135–146)
TOTAL PROTEIN: 6.6 g/dL (ref 6.1–8.1)
Total Bilirubin: 2.8 mg/dL — ABNORMAL HIGH (ref 0.2–1.2)

## 2017-04-19 LAB — H. PYLORI BREATH TEST: H. pylori Breath Test: NOT DETECTED

## 2017-04-19 LAB — LIPASE: Lipase: 17 U/L (ref 7–60)

## 2017-04-20 ENCOUNTER — Other Ambulatory Visit: Payer: Self-pay | Admitting: Family Medicine

## 2017-04-22 ENCOUNTER — Encounter: Payer: Self-pay | Admitting: Family Medicine

## 2017-04-24 ENCOUNTER — Ambulatory Visit
Admission: RE | Admit: 2017-04-24 | Discharge: 2017-04-24 | Disposition: A | Discharge status: Home / Self Care | Payer: Medicare Other | Source: Ambulatory Visit | Attending: Family Medicine | Admitting: Family Medicine

## 2017-04-24 DIAGNOSIS — R319 Hematuria, unspecified: Secondary | ICD-10-CM | POA: Insufficient documentation

## 2017-04-24 DIAGNOSIS — N202 Calculus of kidney with calculus of ureter: Secondary | ICD-10-CM | POA: Diagnosis not present

## 2017-04-24 DIAGNOSIS — R1032 Left lower quadrant pain: Secondary | ICD-10-CM | POA: Insufficient documentation

## 2017-04-25 ENCOUNTER — Encounter: Payer: Self-pay | Admitting: Family Medicine

## 2017-04-25 ENCOUNTER — Other Ambulatory Visit: Payer: 59

## 2017-04-25 DIAGNOSIS — R17 Unspecified jaundice: Secondary | ICD-10-CM

## 2017-04-26 LAB — BILIRUBIN, FRACTIONATED(TOT/DIR/INDIR)
Bilirubin, Direct: 0.5 mg/dL — ABNORMAL HIGH (ref 0.0–0.2)
Indirect Bilirubin: 1.7 mg/dL (calc) — ABNORMAL HIGH (ref 0.2–1.2)
Total Bilirubin: 2.2 mg/dL — ABNORMAL HIGH (ref 0.2–1.2)

## 2017-04-29 ENCOUNTER — Encounter: Payer: Self-pay | Admitting: Family Medicine

## 2017-04-29 ENCOUNTER — Ambulatory Visit: Payer: 59 | Admitting: Family Medicine

## 2017-04-29 VITALS — BP 142/86 | HR 72 | Temp 98.2°F | Resp 16 | Ht 75.0 in | Wt 234.0 lb

## 2017-04-29 DIAGNOSIS — I2510 Atherosclerotic heart disease of native coronary artery without angina pectoris: Secondary | ICD-10-CM | POA: Diagnosis not present

## 2017-04-29 DIAGNOSIS — R634 Abnormal weight loss: Secondary | ICD-10-CM | POA: Diagnosis not present

## 2017-04-29 DIAGNOSIS — R1013 Epigastric pain: Secondary | ICD-10-CM | POA: Diagnosis not present

## 2017-04-29 DIAGNOSIS — M87059 Idiopathic aseptic necrosis of unspecified femur: Secondary | ICD-10-CM | POA: Diagnosis not present

## 2017-04-29 DIAGNOSIS — K219 Gastro-esophageal reflux disease without esophagitis: Secondary | ICD-10-CM | POA: Diagnosis not present

## 2017-04-29 NOTE — Progress Notes (Signed)
Subjective:    Patient ID: James Santiago, male    DOB: [birth year], 67 y.o.   MRN: [id number]  HPI 04/18/17 Since I last saw the patient he has lost no 9 pounds. He states that he is heartburn on a daily basis despite taking dexilant.  He is also taking Zantac at night. Frequently he has to take 1 or 2 Gas-X tablets at night due to bloating, indigestion, and increased gas. He is avoiding many foods due to the reflux and indigestion but they're causing and as a result he has lost 9 pounds. He denies any blood in his stool. He denies any melena. He does report epigastric discomfort. Also he reports gross hematuria 24 hours. Urinalysis is significant for frank blood. He denies any dysuria but he does have some pain radiating into his left lower quadrant. He has a history of uric acid kidney stones in the past.  At that time, my plan was: Patient has very recalcitrant acid reflux and now weight loss. I have recommended that he take his proton pump inhibitor in the morning with food to improve absorption. I will consult GI for EGD to rule out gastric malignancy or ulcer. I will perform an H. pylori breath test today to evaluate for Helicobacter pylori. Regarding his hematuria, I'll obtain a CT scan of the abdomen and pelvis to evaluate for kidney stone. Patient is already on an alpha-blocker in the form of Cardura to facilitate stone passage. I will give him Norco 5/325 one to 2 tablets every 4 hours as needed for severe pain. Recommended he push fluids.  04/29/17 Results of the CT are listed below FINDINGS: Lower chest: Hiatal hernia with intrathoracic stomach. Coronary atherosclerosis.  Hepatobiliary: No focal liver abnormality.Cholecystectomy. Normal common bile duct diameter.  Pancreas: Unremarkable.  Spleen: Unremarkable.  Adrenals/Urinary Tract: Negative adrenals. 6 x 7 mm stone in the distal left ureter, 3 cm proximal to the UVJ. No hydronephrosis. There are multiple bilateral renal  calculi, at least 9 on the right and 12 on the left. Bilateral stone burden has increased from prior. The largest bilateral calculi measures 7 mm. Partially distended urinary bladder which may account for mild generalized thick-walled appearance  Stomach/Bowel: Hiatal hernia as noted above. Colonic diverticulosis. Negative for inflammation.  Vascular/Lymphatic: Mild atherosclerotic calcification of the aorta and iliacs. No mass or adenopathy.  Reproductive:Mild prostatic calcification.  Other: No ascites or pneumoperitoneum. 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 Negative for collapse. Lumbar Spondylosis.  Patient states that he feels much better since his last visit.  His indigestion has completely stopped after he started taking his proton pump inhibitor with food area he states he feels much better and is back to his baseline.  As result he is eating more.  He has gained 2 pounds since his last visit.  I brought him back today to discuss the coincidental findings on his CT scan.  First of all he has bilateral avascular necrosis of both hips.  He has a remote history of smoking 20 years ago.  He also admits to heavy alcohol consumption.  In addition to that he has long-standing hypertension.  He has no history of antiphospholipid syndrome.  Furthermore he is on Xarelto for atrial fibrillation.  He has no history of sickle cell trait or sickle cell anemia given his ethnicity.  He does have a history of frequent glucocorticoid use for gout in the past.  I believe  his biggest contributing factors are his blood pressure, his alcohol consumption, and his smoking.  He also demonstrates coronary artery calcifications, and mild atherosclerosis in the aorta and both iliac arteries.  However he is adamant that he is having no angina, no chest pain, no shortness of breath, no dyspnea on exertion.   He denies orthopnea or paroxysmal nocturnal dyspnea.  He denies any symptoms of claudication with ambulation.  Therefore these are asymptomatic findings Past Medical History:  Diagnosis Date  . A-fib ([place])    a. Dx 01/2012, CHADS2 = 1.-[facility] in past.  . Adenomatous colon polyp 06/2003  . Arthritis    "fingers" (02/01/2012)  . Biliary dyskinesia   . Cholelithiasis    "still got 2 small stones in there" (02/01/2012)  . Esophageal stricture   . GERD (gastroesophageal reflux disease)   . Gout   . Hemorrhoids   . Hiatal hernia   . Hypercholesteremia   . Hypertension   . Nephrolithiasis    "was told kidney function at 65%"- hx. kidney stones   Past Surgical History:  Procedure Laterality Date  . CARDIOVERSION  03/06/2012   Procedure: CARDIOVERSION;  Surgeon: Larey Dresser, MD;  Location: [facility] ENDOSCOPY;  Service: Cardiovascular;  Laterality: N/A;  . CHOLECYSTECTOMY N/A 11/13/2012   Procedure: LAPAROSCOPIC CHOLECYSTECTOMY;  Surgeon: Rolm Bookbinder, MD;  Location: [facility] ORS;  Service: General;  Laterality: N/A;  . COLONOSCOPY    . CYSTOSCOPY W/ URETEROSCOPY W/ LITHOTRIPSY  1980's  . CYSTOSCOPY WITH RETROGRADE PYELOGRAM, URETEROSCOPY AND STENT PLACEMENT  1980's  . ESOPHAGEAL DILATION    . INGUINAL HERNIA REPAIR  ? 2008   "right" (02/01/2012)  . POLYPECTOMY    . VASECTOMY     Current Outpatient Medications on File Prior to Visit  Medication Sig Dispense Refill  . allopurinol (ZYLOPRIM) 300 MG tablet TAKE 1 TABLET BY MOUTH  DAILY 90 tablet 1  . apixaban (ELIQUIS) 5 MG TABS tablet Take 1 tablet (5 mg total) by mouth 2 (two) times daily. 180 tablet 3  . atenolol (TENORMIN) 50 MG tablet TAKE 1 TABLET BY MOUTH  DAILY 90 tablet 3  . DEXILANT 60 MG capsule TAKE 1 CAPSULE BY MOUTH  DAILY 90 capsule 3  . doxazosin (CARDURA) 4 MG tablet TAKE 1 TABLET BY MOUTH  DAILY 90 tablet 1  . HYDROcodone-acetaminophen (NORCO) 5-325 MG tablet Take 1-2 tablets by mouth every 4 (four) hours as needed for  moderate pain. 30 tablet 0  . simvastatin (ZOCOR) 10 MG tablet TAKE 1 TABLET BY MOUTH AT  BEDTIME 90 tablet 1  . telmisartan (MICARDIS) 80 MG tablet TAKE 1 TABLET BY MOUTH  EVERY MORNING 90 tablet 1   No current facility-administered medications on file prior to visit.    Allergies  Allergen Reactions  . Augmentin [Amoxicillin-Pot Clavulanate]     Diarrhea and sick   Social History   Socioeconomic History  . Marital status: Married    Spouse name: Not on file  . Number of children: 1  . Years of education: Not on file  . Highest education level: Not on file  Social Needs  . Financial resource strain: Not on file  . Food insecurity - worry: Not on file  . Food insecurity - inability: Not on file  . Transportation needs - medical: Not on file  . Transportation needs - non-medical: Not on file  Occupational History    Employer: UNEMPLOYED  Tobacco Use  . Smoking status: Former Smoker    Packs/day: 0.50  Years: 12.00    Pack years: 6.00    Types: Cigarettes  . Smokeless tobacco: Current User    Types: Chew  . Tobacco comment: 02/01/2012 smoked cigarettes "probably 30 yr ago."  Currently uses 1/2 pouch of chewing tobacco daily; offered cessation materials; pt declines.  Substance and Sexual Activity  . Alcohol use: Yes    Alcohol/week: 0.0 oz    Comment: occ. beer  . Drug use: No  . Sexual activity: Not Currently  Other Topics Concern  . Not on file  Social History Narrative   Lives in [place] with wife.  They have one grown child and a grandson.  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     Review of Systems  All other systems reviewed and are negative.      Objective:   Physical Exam  Constitutional: He is oriented to person, place, and time. He appears well-developed and well-nourished.  Neck: Neck supple. No JVD present. No thyromegaly present.  Cardiovascular: An irregularly irregular rhythm present.  Pulmonary/Chest: Breath sounds  normal. No respiratory distress. He has no wheezes. He has no rales. He exhibits no tenderness.  Abdominal: Soft. Bowel sounds are normal. He exhibits no distension and no mass. There is no tenderness. There is no rebound and no guarding.  Musculoskeletal: Normal range of motion. He exhibits no edema.  Lymphadenopathy:    He has no cervical adenopathy.  Neurological: He is alert and oriented to person, place, and time. No cranial nerve deficit. He exhibits normal muscle tone. Coordination normal.  Vitals reviewed.        Assessment & Plan:  Dyspepsia  Gastroesophageal reflux disease without esophagitis  Weight loss  Avascular necrosis of femoral head, unspecified laterality (HCC)  Coronary artery calcification seen on CAT scan  Patient's dyspepsia and weight loss have resolved after switching and taking dexilant with food.  The medicine is more efficacious and he is back to his baseline.  His avascular necrosis of his bilateral femoral heads are asymptomatic.  I recommended alcohol cessation.  I recommended risk factor management including hypertension and cholesterol.  I would not recommend an aspirin due to the fact he is already on Xarelto.  At the present time he is asymptomatic and therefore he does not require surgical correction we will monitor this.  His coronary artery calcification well as the calcifications seen in his aorta and bilateral iliacs or asymptomatic.  Therefore we will focus on risk factor control.  I will continue to manage his hypertension and cholesterol.  I have recommended alcohol cessation.  I recommended a healthy diet and regular aerobic exercise.  If the patient is symptomatic, I would consult cardiology.  Return fasting for a fasting lipid panel

## 2017-05-06 ENCOUNTER — Other Ambulatory Visit: Payer: 59

## 2017-05-07 ENCOUNTER — Encounter: Payer: Self-pay | Admitting: Family Medicine

## 2017-05-07 LAB — COMPLETE METABOLIC PANEL WITH GFR
AG RATIO: 1.9 (calc) (ref 1.0–2.5)
ALT: 8 U/L — AB (ref 9–46)
AST: 12 U/L (ref 10–35)
Albumin: 4.1 g/dL (ref 3.6–5.1)
Alkaline phosphatase (APISO): 106 U/L (ref 40–115)
BILIRUBIN TOTAL: 2.4 mg/dL — AB (ref 0.2–1.2)
BUN/Creatinine Ratio: 7 (calc) (ref 6–22)
BUN: 11 mg/dL (ref 7–25)
CHLORIDE: 108 mmol/L (ref 98–110)
CO2: 28 mmol/L (ref 20–32)
Calcium: 9.3 mg/dL (ref 8.6–10.3)
Creat: 1.49 mg/dL — ABNORMAL HIGH (ref 0.70–1.25)
GFR, EST AFRICAN AMERICAN: 56 mL/min/{1.73_m2} — AB (ref >=60)
GFR, Est Non African American: 49 mL/min/{1.73_m2} — ABNORMAL LOW (ref >=60)
Globulin: 2.2 g/dL (calc) (ref 1.9–3.7)
Glucose, Bld: 102 mg/dL — ABNORMAL HIGH (ref 65–99)
POTASSIUM: 3.9 mmol/L (ref 3.5–5.3)
SODIUM: 143 mmol/L (ref 135–146)
TOTAL PROTEIN: 6.3 g/dL (ref 6.1–8.1)

## 2017-05-07 LAB — LIPID PANEL
Cholesterol: 123 mg/dL (ref <200)
HDL: 40 mg/dL — ABNORMAL LOW (ref >40)
LDL Cholesterol (Calc): 60 mg/dL (calc)
Non-HDL Cholesterol (Calc): 83 mg/dL (calc) (ref <130)
Total CHOL/HDL Ratio: 3.1 (calc) (ref <5.0)
Triglycerides: 151 mg/dL — ABNORMAL HIGH (ref <150)

## 2017-05-07 LAB — EXTRA LAV TOP TUBE

## 2017-05-15 ENCOUNTER — Ambulatory Visit: Payer: 59 | Admitting: Gastroenterology

## 2017-06-03 ENCOUNTER — Ambulatory Visit: Payer: 59 | Admitting: Gastroenterology

## 2017-07-03 ENCOUNTER — Other Ambulatory Visit: Payer: Self-pay | Admitting: Family Medicine

## 2017-07-03 DIAGNOSIS — I10 Essential (primary) hypertension: Secondary | ICD-10-CM

## 2017-07-29 ENCOUNTER — Ambulatory Visit: Payer: Medicare Other | Admitting: Family Medicine

## 2017-07-29 ENCOUNTER — Encounter: Payer: Self-pay | Admitting: Family Medicine

## 2017-07-29 VITALS — BP 130/80 | HR 76 | Temp 98.1°F | Resp 16 | Ht 75.0 in | Wt 232.0 lb

## 2017-07-29 DIAGNOSIS — J019 Acute sinusitis, unspecified: Secondary | ICD-10-CM

## 2017-07-29 MED ORDER — AZITHROMYCIN 250 MG PO TABS: ORAL_TABLET | ORAL | 0 refills | Status: DC

## 2017-07-29 MED ORDER — FLUTICASONE PROPIONATE 50 MCG/ACT NA SUSP: 2.0000 | Freq: Every day | NASAL | 6 refills | Status: DC

## 2017-07-29 MED ORDER — LEVOCETIRIZINE DIHYDROCHLORIDE 5 MG PO TABS: 5.0000 mg | ORAL_TABLET | Freq: Every evening | ORAL | 5 refills | Status: DC

## 2017-07-29 NOTE — Progress Notes (Signed)
Subjective:    Patient ID: James Santiago, male    DOB: 22-Jan-1951, 67 y.o.   MRN: [id number]  Medication Refill    His symptoms began 4 weeks ago with rhinorrhea and head congestion.  It has progressed to pain and pressure in his right maxillary sinus, pain and pressure in his right ear, postnasal drip causing a sore scratchy throat with thick mucus that he has to cough up every morning, and a dull headache.  He is tried Mucinex and over-the-counter medication with no relief Past Medical History:  Diagnosis Date  . A-fib ([place])    a. Dx 01/2012, CHADS2 = 1.-[facility] in past.  . Adenomatous colon polyp 06/2003  . Arthritis    "fingers" (02/01/2012)  . Biliary dyskinesia   . Cholelithiasis    "still got 2 small stones in there" (02/01/2012)  . Esophageal stricture   . GERD (gastroesophageal reflux disease)   . Gout   . Hemorrhoids   . Hiatal hernia   . Hypercholesteremia   . Hypertension   . Nephrolithiasis    "was told kidney function at 65%"- hx. kidney stones   Past Surgical History:  Procedure Laterality Date  . CARDIOVERSION  03/06/2012   Procedure: CARDIOVERSION;  Surgeon: Larey Dresser, MD;  Location: [facility] ENDOSCOPY;  Service: Cardiovascular;  Laterality: N/A;  . CHOLECYSTECTOMY N/A 11/13/2012   Procedure: LAPAROSCOPIC CHOLECYSTECTOMY;  Surgeon: Rolm Bookbinder, MD;  Location: [facility] ORS;  Service: General;  Laterality: N/A;  . COLONOSCOPY    . CYSTOSCOPY W/ URETEROSCOPY W/ LITHOTRIPSY  1980's  . CYSTOSCOPY WITH RETROGRADE PYELOGRAM, URETEROSCOPY AND STENT PLACEMENT  1980's  . ESOPHAGEAL DILATION    . INGUINAL HERNIA REPAIR  ? 2008   "right" (02/01/2012)  . POLYPECTOMY    . VASECTOMY     Current Outpatient Medications on File Prior to Visit  Medication Sig Dispense Refill  . allopurinol (ZYLOPRIM) 300 MG tablet TAKE 1 TABLET BY MOUTH  DAILY 90 tablet 1  . atenolol (TENORMIN) 50 MG tablet TAKE 1 TABLET BY MOUTH  DAILY 90 tablet 3  . DEXILANT 60 MG capsule TAKE 1  CAPSULE BY MOUTH  DAILY 90 capsule 3  . doxazosin (CARDURA) 4 MG tablet TAKE 1 TABLET BY MOUTH  DAILY 90 tablet 1  . ELIQUIS 5 MG TABS tablet TAKE 1 TABLET BY MOUTH TWO  TIMES DAILY 180 tablet 3  . HYDROcodone-acetaminophen (NORCO) 5-325 MG tablet Take 1-2 tablets by mouth every 4 (four) hours as needed for moderate pain. 30 tablet 0  . simvastatin (ZOCOR) 10 MG tablet TAKE 1 TABLET BY MOUTH AT  BEDTIME 90 tablet 1  . telmisartan (MICARDIS) 80 MG tablet TAKE 1 TABLET BY MOUTH  EVERY MORNING 90 tablet 1   No current facility-administered medications on file prior to visit.    Allergies  Allergen Reactions  . Augmentin [Amoxicillin-Pot Clavulanate]     Diarrhea and sick   Social History   Socioeconomic History  . Marital status: Married    Spouse name: Not on file  . Number of children: 1  . Years of education: Not on file  . Highest education level: Not on file  Occupational History    Employer: UNEMPLOYED  Social Needs  . Financial resource strain: Not on file  . Food insecurity:    Worry: Not on file    Inability: Not on file  . Transportation needs:    Medical: Not on file    Non-medical: Not on file  Tobacco Use  .  Smoking status: Former Smoker    Packs/day: 0.50    Years: 12.00    Pack years: 6.00    Types: Cigarettes  . Smokeless tobacco: Current User    Types: Chew  . Tobacco comment: 02/01/2012 smoked cigarettes "probably 30 yr ago."  Currently uses 1/2 pouch of chewing tobacco daily; offered cessation materials; pt declines.  Substance and Sexual Activity  . Alcohol use: Yes    Alcohol/week: 0.0 oz    Comment: occ. beer  . Drug use: No  . Sexual activity: Not Currently  Lifestyle  . Physical activity:    Days per week: Not on file    Minutes per session: Not on file  . Stress: Not on file  Relationships  . Social connections:    Talks on phone: Not on file    Gets together: Not on file    Attends religious service: Not on file    Active member of club  or organization: Not on file    Attends meetings of clubs or organizations: Not on file    Relationship status: Not on file  . Intimate partner violence:    Fear of current or ex partner: Not on file    Emotionally abused: Not on file    Physically abused: Not on file    Forced sexual activity: Not on file  Other Topics Concern  . Not on file  Social History Narrative   Lives in [place] with wife.  They have one grown child and a grandson.  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      Review of Systems  All other systems reviewed and are negative.      Objective:   Physical Exam  Constitutional: He appears well-developed and well-nourished.  HENT:  Right Ear: Tympanic membrane, external ear and ear canal normal.  Left Ear: Tympanic membrane, external ear and ear canal normal.  Nose: Mucosal edema and rhinorrhea present. Right sinus exhibits frontal sinus tenderness.  Mouth/Throat: Oropharynx is clear and moist. No oropharyngeal exudate.  Eyes: Conjunctivae are normal.  Neck: Neck supple.  Cardiovascular: Normal rate, regular rhythm and normal heart sounds.  Pulmonary/Chest: Effort normal and breath sounds normal. No respiratory distress. He has no wheezes. He has no rales.  Lymphadenopathy:    He has no cervical adenopathy.  Vitals reviewed.         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  I recommended Xyzal 5 mg p.o. daily and Flonase 2 sprays each nostril daily.  I believe the pain and pressure in his right ear is due to eustachian tube dysfunction and that the scratchy throat is due to postnasal drip.  I believe these medications will help more than anything else.  If not improving, also give the patient a Z-Pak for possible bacterial sinusitis if the symptoms have not improved over the next 5 to 7 days on allergy medication

## 2017-08-26 ENCOUNTER — Encounter: Payer: Self-pay | Admitting: Family Medicine

## 2017-08-29 ENCOUNTER — Ambulatory Visit: Payer: Medicare Other | Admitting: Family Medicine

## 2017-09-12 ENCOUNTER — Other Ambulatory Visit: Payer: Self-pay | Admitting: Family Medicine

## 2017-09-19 ENCOUNTER — Other Ambulatory Visit: Payer: Self-pay | Admitting: Family Medicine

## 2017-12-04 ENCOUNTER — Other Ambulatory Visit: Payer: Self-pay | Admitting: Family Medicine

## 2017-12-04 ENCOUNTER — Encounter: Payer: Self-pay | Admitting: Family Medicine

## 2017-12-04 MED ORDER — TELMISARTAN 80 MG PO TABS: 80.0000 mg | ORAL_TABLET | Freq: Every morning | ORAL | 1 refills | Status: DC

## 2017-12-05 ENCOUNTER — Other Ambulatory Visit: Payer: Self-pay | Admitting: Family Medicine

## 2017-12-05 MED ORDER — LOSARTAN POTASSIUM 100 MG PO TABS: 100.0000 mg | ORAL_TABLET | Freq: Every day | ORAL | 3 refills | Status: DC

## 2018-01-31 ENCOUNTER — Other Ambulatory Visit: Payer: Self-pay | Admitting: Family Medicine

## 2018-02-07 ENCOUNTER — Other Ambulatory Visit: Payer: Self-pay | Admitting: Family Medicine

## 2018-03-28 ENCOUNTER — Other Ambulatory Visit: Payer: Self-pay | Admitting: Family Medicine

## 2018-04-23 ENCOUNTER — Telehealth: Payer: Self-pay | Admitting: Family Medicine

## 2018-04-23 MED ORDER — OSELTAMIVIR PHOSPHATE 75 MG PO CAPS: 75.0000 mg | ORAL_CAPSULE | Freq: Every day | ORAL | 0 refills | Status: AC

## 2018-04-23 NOTE — Telephone Encounter (Signed)
Pt daughter was tested positive for flu a and was wondering if we would call him in some Tamiflu - per Dr. Dennard Schaumann ok to send - med sent to pharm.

## 2018-07-03 ENCOUNTER — Other Ambulatory Visit: Payer: Self-pay | Admitting: Family Medicine

## 2018-07-08 ENCOUNTER — Other Ambulatory Visit: Payer: Self-pay | Admitting: Family Medicine

## 2018-07-20 ENCOUNTER — Other Ambulatory Visit: Payer: Self-pay | Admitting: Family Medicine

## 2018-07-24 ENCOUNTER — Other Ambulatory Visit: Payer: Self-pay | Admitting: Family Medicine

## 2018-07-24 DIAGNOSIS — I10 Essential (primary) hypertension: Secondary | ICD-10-CM

## 2018-07-28 ENCOUNTER — Other Ambulatory Visit: Payer: Self-pay | Admitting: Family Medicine

## 2018-10-17 ENCOUNTER — Encounter: Payer: Self-pay | Admitting: Family Medicine

## 2018-10-17 ENCOUNTER — Ambulatory Visit (INDEPENDENT_AMBULATORY_CARE_PROVIDER_SITE_OTHER): Payer: Medicare Other | Admitting: Family Medicine

## 2018-10-17 ENCOUNTER — Other Ambulatory Visit: Payer: Self-pay

## 2018-10-17 VITALS — BP 144/92 | HR 78 | Temp 98.0°F | Resp 18 | Ht 74.0 in | Wt 243.2 lb

## 2018-10-17 DIAGNOSIS — M109 Gout, unspecified: Secondary | ICD-10-CM | POA: Insufficient documentation

## 2018-10-17 DIAGNOSIS — I4891 Unspecified atrial fibrillation: Secondary | ICD-10-CM

## 2018-10-17 DIAGNOSIS — I480 Paroxysmal atrial fibrillation: Secondary | ICD-10-CM | POA: Diagnosis not present

## 2018-10-17 DIAGNOSIS — K219 Gastro-esophageal reflux disease without esophagitis: Secondary | ICD-10-CM

## 2018-10-17 DIAGNOSIS — I10 Essential (primary) hypertension: Secondary | ICD-10-CM

## 2018-10-17 DIAGNOSIS — I482 Chronic atrial fibrillation, unspecified: Secondary | ICD-10-CM | POA: Insufficient documentation

## 2018-10-17 DIAGNOSIS — E785 Hyperlipidemia, unspecified: Secondary | ICD-10-CM

## 2018-10-17 NOTE — Assessment & Plan Note (Signed)
Elevations in his blood pressure in the setting of his atrial fibrillation although he is not in RVR.  Because he has known chronic kidney disease unclear what his creatinine is he does not follow with cardiology or nephrology will keep his telmisartan at the current dose as this is the typical max for this medication.  We will increase his atenolol to 75 mg once a day and see how he does.  We will check his labs including TSH lipid panel.  We also discussed significant dietary changes specially with his weight gain he is eating a lot of sweets carbs processed food.  He is going to follow-up in 1 week with his PCP for recheck.

## 2018-10-17 NOTE — Progress Notes (Signed)
Subjective:    Patient ID: James Santiago, male    DOB: Feb 02, 1951, 68 y.o.   MRN: [id number]  Patient presents for Hypertension   Pt here with elevated blood pressure, his last vidit was June 2019. He  Does not follow with cardiology either.   He felt a little headachy BP was  164/111, he then took meds and systolic was  [id number]    Daughter checked  Was 164/102   ( On micardis, cardura and atenolol)  No chest pain,no SOB, no leg swelling  weight up 10lbs over past year due to diet mitts that he has a very poor diet he often snacks throughout the day chips unhealthy food processed food.    He hasa hiatal hernia- gets a lot of reflux, so he nibble all day, but on very unhealthy foods , on dexilant   History of atrial fibrillation- on eliquis and atenolol    Hehas not had any palpitations    Hyperlipidemia on zocor 10mg  once a day    Gout- on allopurinol   Has history of CKD- was followed by Dr. Blanchard Mane at [facility] , has not been seen in years    Review Of Systems:  GEN- denies fatigue, fever, weight loss,weakness, recent illness HEENT- denies eye drainage, change in vision, nasal discharge, CVS- denies chest pain, palpitations RESP- denies SOB, cough, wheeze ABD- denies N/V, change in stools, abd pain GU- denies dysuria, hematuria, dribbling, incontinence MSK- denies joint pain, muscle aches, injury Neuro- denies headache, dizziness, syncope, seizure activity       Objective:    BP (!) 144/92 (BP Location: Right Arm, Patient Position: Sitting, Cuff Size: Large)   Pulse 78   Temp 98 F (36.7 C) (Oral)   Resp 18   Ht 6\' 2"  (1.88 m)   Wt 243 lb 3.2 oz (110.3 kg)   SpO2 98%   BMI 31.23 kg/m  GEN- NAD, alert and oriented x3  , repeat BP 142/90 left arm  HEENT- PERRL, EOMI, non injected sclera, pink conjunctiva, MMM, oropharynx clear Neck- Supple, no thyromegaly CVS-irregular rhythem, normal rate , no murmur RESP-CTAB ABD-NABS,soft,NT,ND EXT- No  edema Pulses- Radial, DP- 2+  EKG- A FIB HR 70's       Assessment & Plan:      Problem List Items Addressed This Visit      Unprioritized   Atrial fibrillation ([place]) - Primary   Relevant Orders   EKG 12-Lead (Completed)   Essential hypertension    Elevations in his blood pressure in the setting of his atrial fibrillation although he is not in RVR.  Because he has known chronic kidney disease unclear what his creatinine is he does not follow with cardiology or nephrology will keep his telmisartan at the current dose as this is the typical max for this medication.  We will increase his atenolol to 75 mg once a day and see how he does.  We will check his labs including TSH lipid panel.  We also discussed significant dietary changes specially with his weight gain he is eating a lot of sweets carbs processed food.  He is going to follow-up in 1 week with his PCP for recheck.      Relevant Orders   CBC with Differential/Platelet   Comprehensive metabolic panel   TSH   GERD (gastroesophageal reflux disease)    Continue Dexilant      Hyperlipemia   Relevant Orders   Lipid panel      Note:  This dictation was prepared with Dragon dictation along with smaller phrase technology. Any transcriptional errors that result from this process are unintentional.

## 2018-10-17 NOTE — Assessment & Plan Note (Signed)
Continue Dexilant 

## 2018-10-17 NOTE — Patient Instructions (Signed)
Take atenolol 1.5 tablets  Once a day Keep the others the same  EKG showed A trial Fibrillation  F/U 1 week with Dr. Dennard Schaumann

## 2018-10-18 LAB — COMPREHENSIVE METABOLIC PANEL
AG Ratio: 2 (calc) (ref 1.0–2.5)
ALT: 12 U/L (ref 9–46)
AST: 18 U/L (ref 10–35)
Albumin: 4.3 g/dL (ref 3.6–5.1)
Alkaline phosphatase (APISO): 104 U/L (ref 35–144)
BUN/Creatinine Ratio: 7 (calc) (ref 6–22)
BUN: 10 mg/dL (ref 7–25)
CO2: 24 mmol/L (ref 20–32)
Calcium: 9.6 mg/dL (ref 8.6–10.3)
Chloride: 105 mmol/L (ref 98–110)
Creat: 1.5 mg/dL — ABNORMAL HIGH (ref 0.70–1.25)
Globulin: 2.2 g/dL (calc) (ref 1.9–3.7)
Glucose, Bld: 97 mg/dL (ref 65–99)
Potassium: 4.4 mmol/L (ref 3.5–5.3)
Sodium: 140 mmol/L (ref 135–146)
Total Bilirubin: 2.2 mg/dL — ABNORMAL HIGH (ref 0.2–1.2)
Total Protein: 6.5 g/dL (ref 6.1–8.1)

## 2018-10-18 LAB — CBC WITH DIFFERENTIAL/PLATELET
Absolute Monocytes: 407 cells/uL (ref 200–950)
Basophils Absolute: 69 cells/uL (ref 0–200)
Basophils Relative: 1.4 %
Eosinophils Absolute: 147 cells/uL (ref 15–500)
Eosinophils Relative: 3 %
HCT: 46.8 % (ref 38.5–50.0)
Hemoglobin: 15.6 g/dL (ref 13.2–17.1)
Lymphs Abs: 985 cells/uL (ref 850–3900)
MCH: 30.4 pg (ref 27.0–33.0)
MCHC: 33.3 g/dL (ref 32.0–36.0)
MCV: 91.2 fL (ref 80.0–100.0)
MPV: 10.9 fL (ref 7.5–12.5)
Monocytes Relative: 8.3 %
Neutro Abs: 3293 cells/uL (ref 1500–7800)
Neutrophils Relative %: 67.2 %
Platelets: 175 10*3/uL (ref 140–400)
RBC: 5.13 10*6/uL (ref 4.20–5.80)
RDW: 14.1 % (ref 11.0–15.0)
Total Lymphocyte: 20.1 %
WBC: 4.9 10*3/uL (ref 3.8–10.8)

## 2018-10-18 LAB — LIPID PANEL
Cholesterol: 145 mg/dL (ref <200)
HDL: 49 mg/dL (ref >=40)
LDL Cholesterol (Calc): 76 mg/dL (calc)
Non-HDL Cholesterol (Calc): 96 mg/dL (calc) (ref <130)
Total CHOL/HDL Ratio: 3 (calc) (ref <5.0)
Triglycerides: 115 mg/dL (ref <150)

## 2018-10-18 LAB — TSH: TSH: 1.63 mIU/L (ref 0.40–4.50)

## 2018-10-23 ENCOUNTER — Ambulatory Visit (INDEPENDENT_AMBULATORY_CARE_PROVIDER_SITE_OTHER): Payer: Medicare Other | Admitting: Family Medicine

## 2018-10-23 ENCOUNTER — Other Ambulatory Visit: Payer: Self-pay

## 2018-10-23 ENCOUNTER — Encounter: Payer: Self-pay | Admitting: Family Medicine

## 2018-10-23 VITALS — BP 138/98 | HR 80 | Temp 98.7°F | Resp 16 | Ht 75.0 in | Wt 240.0 lb

## 2018-10-23 DIAGNOSIS — M25561 Pain in right knee: Secondary | ICD-10-CM | POA: Diagnosis not present

## 2018-10-23 DIAGNOSIS — I10 Essential (primary) hypertension: Secondary | ICD-10-CM | POA: Diagnosis not present

## 2018-10-23 DIAGNOSIS — I4891 Unspecified atrial fibrillation: Secondary | ICD-10-CM | POA: Diagnosis not present

## 2018-10-23 DIAGNOSIS — E785 Hyperlipidemia, unspecified: Secondary | ICD-10-CM

## 2018-10-23 MED ORDER — AMLODIPINE BESYLATE 2.5 MG PO TABS: 2.5000 mg | ORAL_TABLET | Freq: Every day | ORAL | 3 refills | Status: DC

## 2018-10-23 NOTE — Progress Notes (Signed)
Subjective:    Patient ID: James Santiago, male    DOB: 12-27-1950, 68 y.o.   MRN: [id number]  HPI  Patient recently saw my partner and at that time his blood pressure was very high.  She increases it 50 to 75 mg a day and.  He feels much better than last time however his diastolic blood pressure has been consistently greater than 90.  His most recent lab work is listed below and actually looks relatively good except for his stable chronic kidney disease: Office Visit on 10/17/2018  Component Date Value Ref Range Status  . WBC 10/17/2018 4.9  3.8 - 10.8 Thousand/uL Final  . RBC 10/17/2018 5.13  4.20 - 5.80 Million/uL Final  . Hemoglobin 10/17/2018 15.6  13.2 - 17.1 g/dL Final  . HCT 10/17/2018 46.8  38.5 - 50.0 % Final  . MCV 10/17/2018 91.2  80.0 - 100.0 fL Final  . MCH 10/17/2018 30.4  27.0 - 33.0 pg Final  . MCHC 10/17/2018 33.3  32.0 - 36.0 g/dL Final  . RDW 10/17/2018 14.1  11.0 - 15.0 % Final  . Platelets 10/17/2018 175  140 - 400 Thousand/uL Final  . MPV 10/17/2018 10.9  7.5 - 12.5 fL Final  . Neutro Abs 10/17/2018 3,293  1,500 - 7,800 cells/uL Final  . Lymphs Abs 10/17/2018 985  850 - 3,900 cells/uL Final  . Absolute Monocytes 10/17/2018 407  200 - 950 cells/uL Final  . Eosinophils Absolute 10/17/2018 147  15 - 500 cells/uL Final  . Basophils Absolute 10/17/2018 69  0 - 200 cells/uL Final  . Neutrophils Relative % 10/17/2018 67.2  % Final  . Total Lymphocyte 10/17/2018 20.1  % Final  . Monocytes Relative 10/17/2018 8.3  % Final  . Eosinophils Relative 10/17/2018 3.0  % Final  . Basophils Relative 10/17/2018 1.4  % Final  . Glucose, Bld 10/17/2018 97  65 - 99 mg/dL Final   Comment: .            Fasting reference interval .   . BUN 10/17/2018 10  7 - 25 mg/dL Final  . Creat 10/17/2018 1.50* 0.70 - 1.25 mg/dL Final   Comment: For patients >110 years of age, the reference limit for Creatinine is approximately 13% higher for people identified as African-American. .   Havery Moros Ratio 10/17/2018 7  6 - 22 (calc) Final  . Sodium 10/17/2018 140  135 - 146 mmol/L Final  . Potassium 10/17/2018 4.4  3.5 - 5.3 mmol/L Final  . Chloride 10/17/2018 105  98 - 110 mmol/L Final  . CO2 10/17/2018 24  20 - 32 mmol/L Final  . Calcium 10/17/2018 9.6  8.6 - 10.3 mg/dL Final  . Total Protein 10/17/2018 6.5  6.1 - 8.1 g/dL Final  . Albumin 10/17/2018 4.3  3.6 - 5.1 g/dL Final  . Globulin 10/17/2018 2.2  1.9 - 3.7 g/dL (calc) Final  . AG Ratio 10/17/2018 2.0  1.0 - 2.5 (calc) Final  . Total Bilirubin 10/17/2018 2.2* 0.2 - 1.2 mg/dL Final  . Alkaline phosphatase (APISO) 10/17/2018 104  35 - 144 U/L Final  . AST 10/17/2018 18  10 - 35 U/L Final  . ALT 10/17/2018 12  9 - 46 U/L Final  . Cholesterol 10/17/2018 145  <200 mg/dL Final  . HDL 10/17/2018 49  > OR = 40 mg/dL Final  . Triglycerides 10/17/2018 115  <150 mg/dL Final  . LDL Cholesterol (Calc) 10/17/2018 76  mg/dL (calc) Final  Comment: Reference range: <100 . Desirable range <100 mg/dL for primary prevention;   <70 mg/dL for patients with CHD or diabetic patients  with > or = 2 CHD risk factors. Marland Kitchen LDL-C is now calculated using the Martin-Hopkins  calculation, which is a validated novel method providing  better accuracy than the Friedewald equation in the  estimation of LDL-C.  Cresenciano Genre et al. Annamaria Helling. [id number]): 2061-2068  (http://education.[facility].com/faq/FAQ164)   . Total CHOL/HDL Ratio 10/17/2018 3.0  <5.0 (calc) Final  . Non-HDL Cholesterol (Calc) 10/17/2018 96  <130 mg/dL (calc) Final   Comment: For patients with diabetes plus 1 major ASCVD risk  factor, treating to a non-HDL-C goal of <100 mg/dL  (LDL-C of <70 mg/dL) is considered a therapeutic  option.   . TSH 10/17/2018 1.63  0.40 - 4.50 mIU/L Final   Patient denies any chest pain shortness of breath or dyspnea on exertion Past Medical History:  Diagnosis Date  . A-fib ([place])    a. Dx 01/2012, CHADS2 = 1.-[facility] in past.  .  Adenomatous colon polyp 06/2003  . Arthritis    "fingers" (02/01/2012)  . Biliary dyskinesia   . Cholelithiasis    "still got 2 small stones in there" (02/01/2012)  . Esophageal stricture   . GERD (gastroesophageal reflux disease)   . Gout   . Hemorrhoids   . Hiatal hernia   . Hypercholesteremia   . Hypertension   . Nephrolithiasis    "was told kidney function at 65%"- hx. kidney stones   Past Surgical History:  Procedure Laterality Date  . CARDIOVERSION  03/06/2012   Procedure: CARDIOVERSION;  Surgeon: Larey Dresser, MD;  Location: [facility] ENDOSCOPY;  Service: Cardiovascular;  Laterality: N/A;  . CHOLECYSTECTOMY N/A 11/13/2012   Procedure: LAPAROSCOPIC CHOLECYSTECTOMY;  Surgeon: Rolm Bookbinder, MD;  Location: [facility] ORS;  Service: General;  Laterality: N/A;  . COLONOSCOPY    . CYSTOSCOPY W/ URETEROSCOPY W/ LITHOTRIPSY  1980's  . CYSTOSCOPY WITH RETROGRADE PYELOGRAM, URETEROSCOPY AND STENT PLACEMENT  1980's  . ESOPHAGEAL DILATION    . INGUINAL HERNIA REPAIR  ? 2008   "right" (02/01/2012)  . POLYPECTOMY    . VASECTOMY     Current Outpatient Medications on File Prior to Visit  Medication Sig Dispense Refill  . allopurinol (ZYLOPRIM) 300 MG tablet TAKE 1 TABLET BY MOUTH  DAILY 90 tablet 1  . atenolol (TENORMIN) 50 MG tablet TAKE 1 TABLET BY MOUTH  DAILY 90 tablet 3  . DEXILANT 60 MG capsule TAKE 1 CAPSULE BY MOUTH  DAILY 90 capsule 3  . doxazosin (CARDURA) 4 MG tablet TAKE 1 TABLET BY MOUTH  DAILY 90 tablet 1  . ELIQUIS 5 MG TABS tablet TAKE 1 TABLET BY MOUTH TWO  TIMES DAILY 180 tablet 3  . simvastatin (ZOCOR) 10 MG tablet TAKE 1 TABLET BY MOUTH AT  BEDTIME 90 tablet 1  . telmisartan (MICARDIS) 80 MG tablet TAKE 1 TABLET BY MOUTH  EVERY MORNING 90 tablet 1   No current facility-administered medications on file prior to visit.    Allergies  Allergen Reactions  . Augmentin [Amoxicillin-Pot Clavulanate]     Diarrhea and sick   Social History   Socioeconomic History  . Marital  status: Married    Spouse name: Not on file  . Number of children: 1  . Years of education: Not on file  . Highest education level: Not on file  Occupational History    Employer: UNEMPLOYED  Social Needs  . Financial resource strain: Not on  file  . Food insecurity    Worry: Not on file    Inability: Not on file  . Transportation needs    Medical: Not on file    Non-medical: Not on file  Tobacco Use  . Smoking status: Former Smoker    Packs/day: 0.50    Years: 12.00    Pack years: 6.00    Types: Cigarettes  . Smokeless tobacco: Current User    Types: Chew  . Tobacco comment: 02/01/2012 smoked cigarettes "probably 30 yr ago."  Currently uses 1/2 pouch of chewing tobacco daily; offered cessation materials; pt declines.  Substance and Sexual Activity  . Alcohol use: Yes    Alcohol/week: 0.0 standard drinks    Comment: occ. beer  . Drug use: No  . Sexual activity: Not Currently  Lifestyle  . Physical activity    Days per week: Not on file    Minutes per session: Not on file  . Stress: Not on file  Relationships  . Social Herbalist on phone: Not on file    Gets together: Not on file    Attends religious service: Not on file    Active member of club or organization: Not on file    Attends meetings of clubs or organizations: Not on file    Relationship status: Not on file  . Intimate partner violence    Fear of current or ex partner: Not on file    Emotionally abused: Not on file    Physically abused: Not on file    Forced sexual activity: Not on file  Other Topics Concern  . Not on file  Social History Narrative   Lives in [place] with wife.  They have one grown child and a grandson.  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     Review of Systems  All other systems reviewed and are negative.      Objective:   Physical Exam Vitals signs reviewed.  Constitutional:      General: He is not in acute distress.    Appearance: He  is normal weight. He is not ill-appearing or toxic-appearing.  Cardiovascular:     Rate and Rhythm: Normal rate. Rhythm irregular.     Heart sounds: Normal heart sounds. No murmur.  Pulmonary:     Effort: Pulmonary effort is normal. No respiratory distress.     Breath sounds: Normal breath sounds. No wheezing, rhonchi or rales.  Abdominal:     General: Abdomen is flat. Bowel sounds are normal.     Palpations: Abdomen is soft.  Musculoskeletal:     Right lower leg: No edema.     Left lower leg: No edema.  Neurological:     Mental Status: He is alert.           Assessment & Plan:  Acute pain of right knee - Plan: DG Knee Complete 4 Views Right  Atrial fibrillation, unspecified type ([place])  Essential hypertension  Hyperlipidemia, unspecified hyperlipidemia type  Blood pressure still elevated.  We will add amlodipine 2.5 mg p.o. daily to his medical regimen and then recheck blood pressure in 2 weeks.  Continue atenolol 75 mg a day and make no other changes to his antihypertensive regimen.  Kidney disease is stable.  Cholesterol is acceptable.  Thyroid test is normal.  Liver function tests are also normal.  Patient also mentions that he has pain in his right knee primarily over the medial joint line.  It hurts with a sharp stabbing pain whenever he walks on it.  Has been like that for several weeks.  Therefore I recommended an x-ray of his knee to evaluate further.

## 2018-10-24 ENCOUNTER — Ambulatory Visit: Payer: Medicare Other | Admitting: Family Medicine

## 2018-10-24 ENCOUNTER — Other Ambulatory Visit: Payer: Self-pay | Admitting: Family Medicine

## 2018-10-24 DIAGNOSIS — I10 Essential (primary) hypertension: Secondary | ICD-10-CM

## 2018-10-24 MED ORDER — ATENOLOL 50 MG PO TABS: 75.0000 mg | ORAL_TABLET | Freq: Every day | ORAL | 3 refills | Status: DC

## 2018-10-26 ENCOUNTER — Ambulatory Visit
Admission: RE | Admit: 2018-10-26 | Discharge: 2018-10-26 | Disposition: A | Discharge status: Home / Self Care | Payer: Medicare Other | Source: Ambulatory Visit | Attending: Family Medicine | Admitting: Family Medicine

## 2018-10-26 DIAGNOSIS — M25561 Pain in right knee: Secondary | ICD-10-CM | POA: Insufficient documentation

## 2018-10-31 ENCOUNTER — Encounter: Payer: Self-pay | Admitting: Family Medicine

## 2018-10-31 ENCOUNTER — Ambulatory Visit (INDEPENDENT_AMBULATORY_CARE_PROVIDER_SITE_OTHER): Payer: Medicare Other | Admitting: Family Medicine

## 2018-10-31 ENCOUNTER — Other Ambulatory Visit: Payer: Self-pay

## 2018-10-31 VITALS — BP 124/88 | HR 70 | Temp 98.4°F | Resp 16 | Ht 75.0 in | Wt 236.0 lb

## 2018-10-31 DIAGNOSIS — M25561 Pain in right knee: Secondary | ICD-10-CM

## 2018-10-31 DIAGNOSIS — I10 Essential (primary) hypertension: Secondary | ICD-10-CM | POA: Diagnosis not present

## 2018-10-31 MED ORDER — ATENOLOL 50 MG PO TABS: 75.0000 mg | ORAL_TABLET | Freq: Every day | ORAL | 3 refills | Status: DC

## 2018-10-31 NOTE — Progress Notes (Signed)
Subjective:    Patient ID: James Santiago, male    DOB: 1950/06/20, 68 y.o.   MRN: [id number]  HPI Please see our last office visit.  Patient reported right knee pain.  At that time I ordered an x-ray of his right knee which did confirm medial compartment arthritis.  Patient is here today for cortisone injection in the knee.  He continues to report pain primarily under the kneecap and also in the medial compartment.  He has a small effusion today.  There is no erythema or warmth. Past Medical History:  Diagnosis Date  . A-fib ([place])    a. Dx 01/2012, CHADS2 = 1.-[facility] in past.  . Adenomatous colon polyp 06/2003  . Arthritis    "fingers" (02/01/2012)  . Biliary dyskinesia   . Cholelithiasis    "still got 2 small stones in there" (02/01/2012)  . Esophageal stricture   . GERD (gastroesophageal reflux disease)   . Gout   . Hemorrhoids   . Hiatal hernia   . Hypercholesteremia   . Hypertension   . Nephrolithiasis    "was told kidney function at 65%"- hx. kidney stones   Past Surgical History:  Procedure Laterality Date  . CARDIOVERSION  03/06/2012   Procedure: CARDIOVERSION;  Surgeon: Larey Dresser, MD;  Location: [facility] ENDOSCOPY;  Service: Cardiovascular;  Laterality: N/A;  . CHOLECYSTECTOMY N/A 11/13/2012   Procedure: LAPAROSCOPIC CHOLECYSTECTOMY;  Surgeon: Rolm Bookbinder, MD;  Location: [facility] ORS;  Service: General;  Laterality: N/A;  . COLONOSCOPY    . CYSTOSCOPY W/ URETEROSCOPY W/ LITHOTRIPSY  1980's  . CYSTOSCOPY WITH RETROGRADE PYELOGRAM, URETEROSCOPY AND STENT PLACEMENT  1980's  . ESOPHAGEAL DILATION    . INGUINAL HERNIA REPAIR  ? 2008   "right" (02/01/2012)  . POLYPECTOMY    . VASECTOMY     Current Outpatient Medications on File Prior to Visit  Medication Sig Dispense Refill  . allopurinol (ZYLOPRIM) 300 MG tablet TAKE 1 TABLET BY MOUTH  DAILY 90 tablet 1  . amLODipine (NORVASC) 2.5 MG tablet Take 1 tablet (2.5 mg total) by mouth daily. 30 tablet 3  . DEXILANT 60 MG  capsule TAKE 1 CAPSULE BY MOUTH  DAILY 90 capsule 3  . doxazosin (CARDURA) 4 MG tablet TAKE 1 TABLET BY MOUTH  DAILY 90 tablet 1  . ELIQUIS 5 MG TABS tablet TAKE 1 TABLET BY MOUTH TWO  TIMES DAILY 180 tablet 3  . simvastatin (ZOCOR) 10 MG tablet TAKE 1 TABLET BY MOUTH AT  BEDTIME 90 tablet 1  . telmisartan (MICARDIS) 80 MG tablet TAKE 1 TABLET BY MOUTH  EVERY MORNING 90 tablet 1   No current facility-administered medications on file prior to visit.    Allergies  Allergen Reactions  . Augmentin [Amoxicillin-Pot Clavulanate]     Diarrhea and sick   Social History   Socioeconomic History  . Marital status: Married    Spouse name: Not on file  . Number of children: 1  . Years of education: Not on file  . Highest education level: Not on file  Occupational History    Employer: UNEMPLOYED  Social Needs  . Financial resource strain: Not on file  . Food insecurity    Worry: Not on file    Inability: Not on file  . Transportation needs    Medical: Not on file    Non-medical: Not on file  Tobacco Use  . Smoking status: Former Smoker    Packs/day: 0.50    Years: 12.00  Pack years: 6.00    Types: Cigarettes  . Smokeless tobacco: Current User    Types: Chew  . Tobacco comment: 02/01/2012 smoked cigarettes "probably 30 yr ago."  Currently uses 1/2 pouch of chewing tobacco daily; offered cessation materials; pt declines.  Substance and Sexual Activity  . Alcohol use: Yes    Alcohol/week: 0.0 standard drinks    Comment: occ. beer  . Drug use: No  . Sexual activity: Not Currently  Lifestyle  . Physical activity    Days per week: Not on file    Minutes per session: Not on file  . Stress: Not on file  Relationships  . Social Herbalist on phone: Not on file    Gets together: Not on file    Attends religious service: Not on file    Active member of club or organization: Not on file    Attends meetings of clubs or organizations: Not on file    Relationship status: Not  on file  . Intimate partner violence    Fear of current or ex partner: Not on file    Emotionally abused: Not on file    Physically abused: Not on file    Forced sexual activity: Not on file  Other Topics Concern  . Not on file  Social History Narrative   Lives in [place] with wife.  They have one grown child and a grandson.  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      Review of Systems  All other systems reviewed and are negative.      Objective:   Physical Exam Vitals signs reviewed.  Cardiovascular:     Rate and Rhythm: Normal rate and regular rhythm.  Pulmonary:     Effort: Pulmonary effort is normal.     Breath sounds: Normal breath sounds.  Musculoskeletal:     Right knee: He exhibits decreased range of motion and effusion. Tenderness found. Medial joint line tenderness noted.           Assessment & Plan:  Acute pain of right knee  Benign essential HTN - Plan: atenolol (TENORMIN) 50 MG tablet  Patient has osteoarthritis in the right knee.  Using sterile technique, I injected the right knee with 2 cc of lidocaine, 2 cc of Marcaine, and 2 cc of 40 mg/mL Kenalog.  Patient tolerated the procedure well with out complication.

## 2018-11-20 ENCOUNTER — Other Ambulatory Visit: Payer: Self-pay | Admitting: Family Medicine

## 2018-11-20 DIAGNOSIS — I10 Essential (primary) hypertension: Secondary | ICD-10-CM

## 2018-11-20 MED ORDER — ATENOLOL 50 MG PO TABS: 75.0000 mg | ORAL_TABLET | Freq: Every day | ORAL | 3 refills | Status: DC

## 2018-12-01 ENCOUNTER — Other Ambulatory Visit: Payer: Self-pay | Admitting: Family Medicine

## 2019-02-11 ENCOUNTER — Other Ambulatory Visit: Payer: Medicare Other

## 2019-02-11 ENCOUNTER — Ambulatory Visit: Payer: Medicare Other | Attending: Internal Medicine

## 2019-02-11 DIAGNOSIS — Z20822 Contact with and (suspected) exposure to covid-19: Secondary | ICD-10-CM

## 2019-02-12 ENCOUNTER — Other Ambulatory Visit: Payer: Medicare Other

## 2019-02-13 LAB — NOVEL CORONAVIRUS, NAA: SARS-CoV-2, NAA: DETECTED — AB

## 2019-03-20 ENCOUNTER — Other Ambulatory Visit: Payer: Self-pay | Admitting: Family Medicine

## 2019-04-05 ENCOUNTER — Other Ambulatory Visit: Payer: Self-pay | Admitting: Family Medicine

## 2019-04-18 IMAGING — CT CT RENAL STONE PROTOCOL
2 of 8 series · 14 of 46 positions shown, 18 images · non-contrast
Comparison: Abdominal CT 10/03/2004

CLINICAL DATA: Flank pain with stone disease suspected.

EXAM:
CT ABDOMEN AND PELVIS WITHOUT CONTRAST
TECHNIQUE: Multidetector CT imaging of the abdomen and pelvis was performed
following the standard protocol without IV contrast.

[Series 2: stone full standard · axial · 0.81mm/px · z∈[-1050,-610]mm · 11 of 102 slices shown, 15 images]
[im 7/102  soft-tissue]
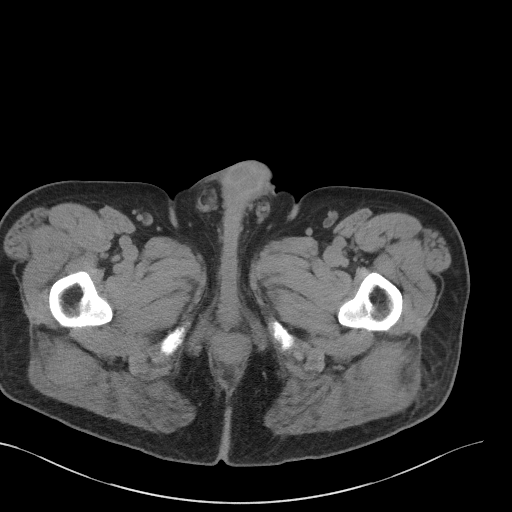
[im 7/102  bone]
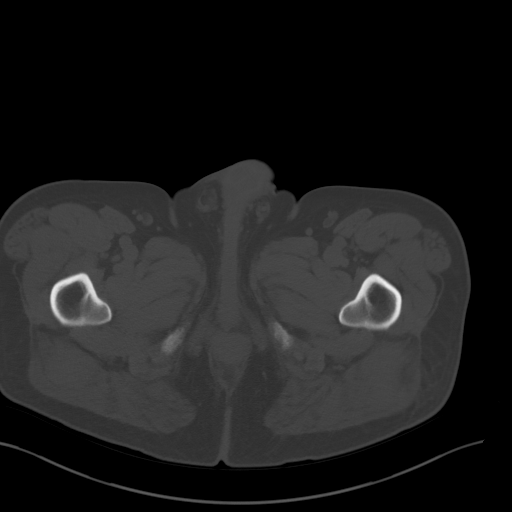
[im 19/102  soft-tissue]
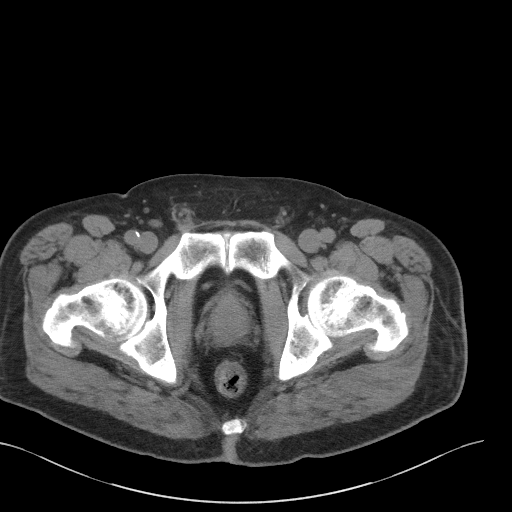
[im 32/102  soft-tissue]
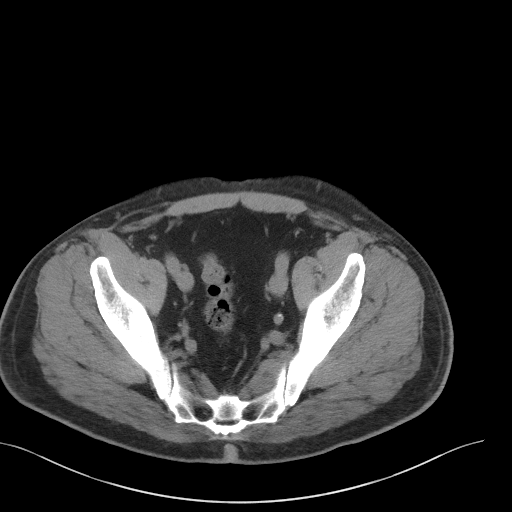
[im 38/102  soft-tissue]
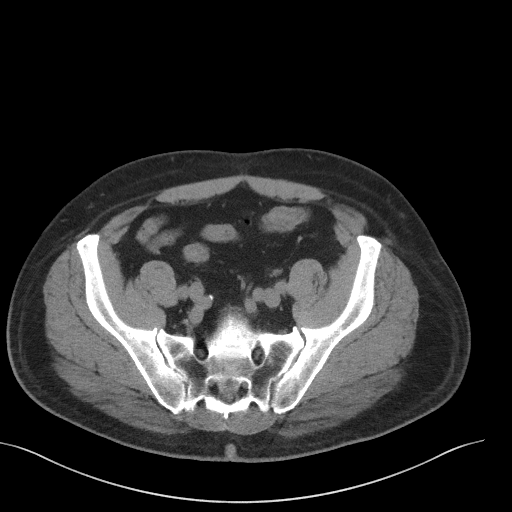
[im 51/102  soft-tissue]
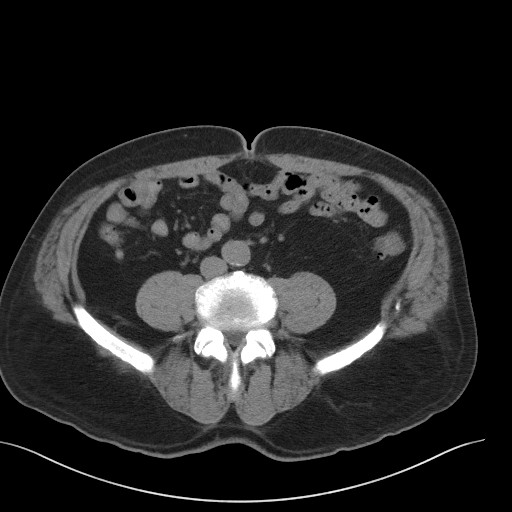
[im 64/102  soft-tissue]
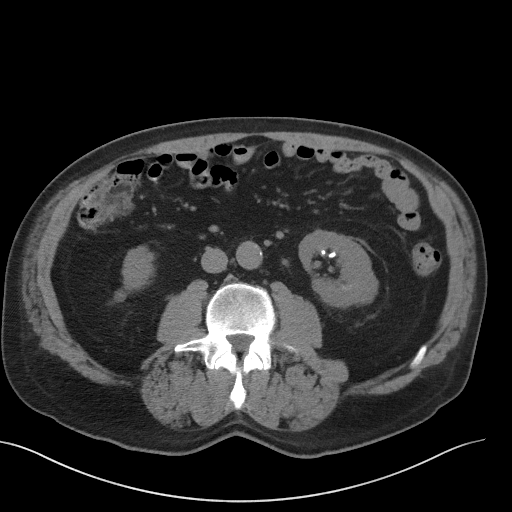
[im 70/102  soft-tissue]
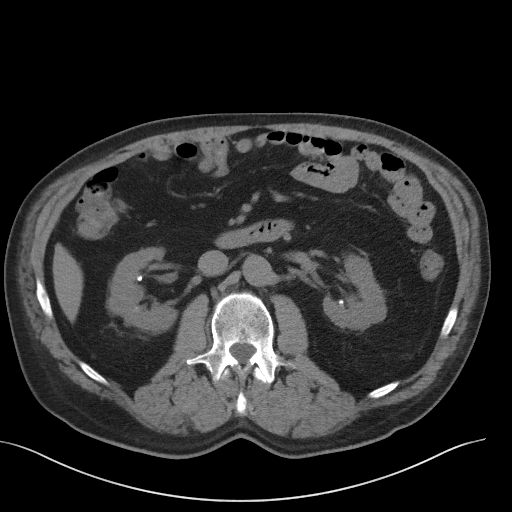
[im 76/102  lung]
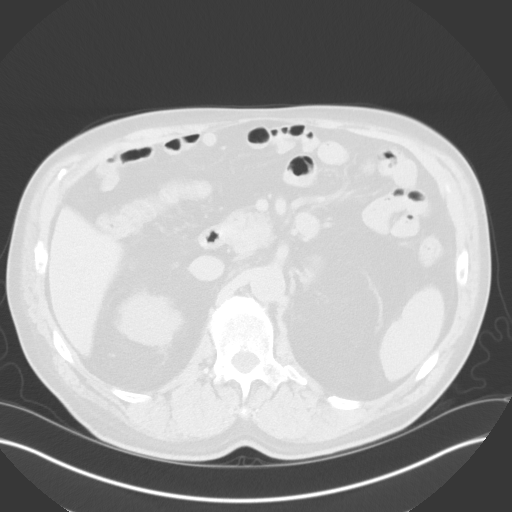
[im 83/102  soft-tissue]
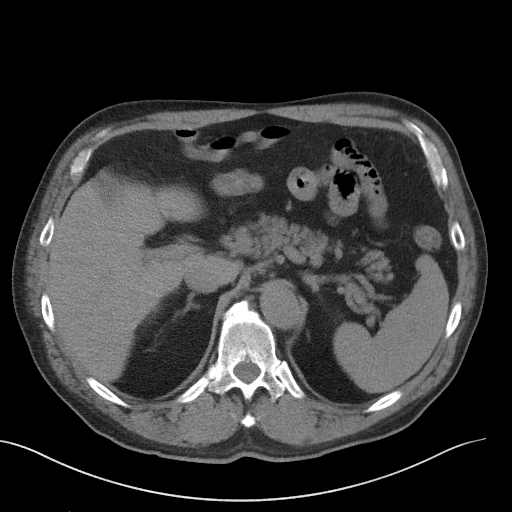
[im 83/102  lung]
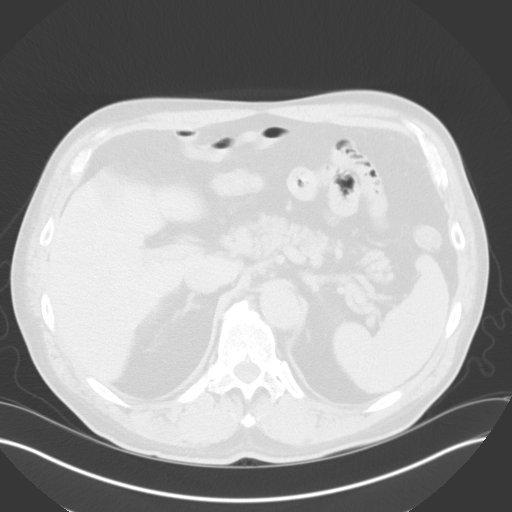
[im 89/102  lung]
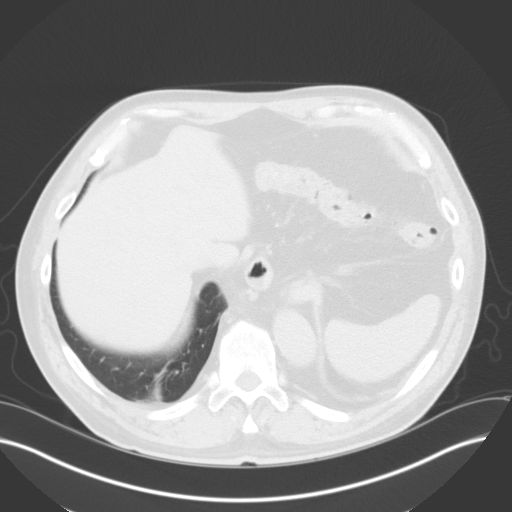
[im 95/102  soft-tissue]
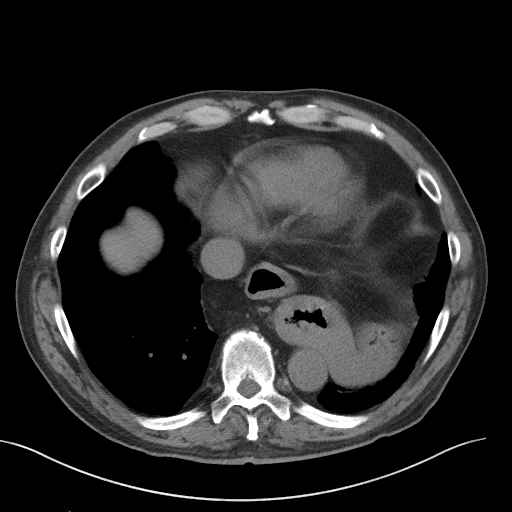
[im 95/102  lung]
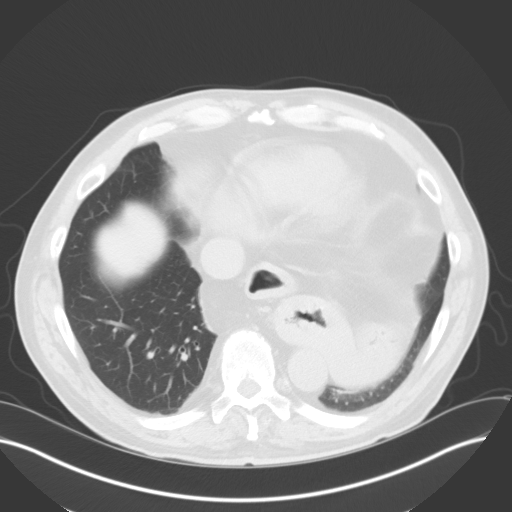
[im 95/102  bone]
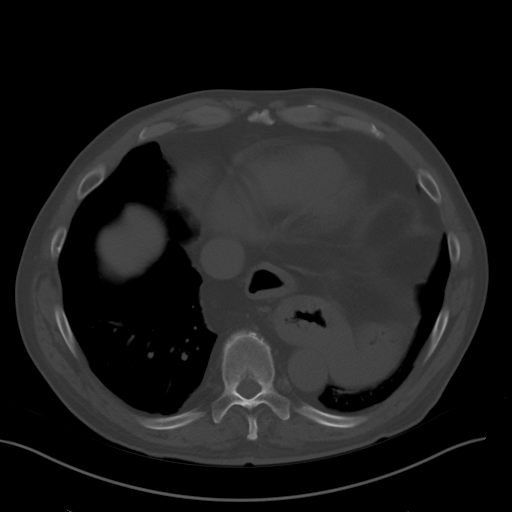

[Series 8: coronal · coronal · 0.83mm/px · 3 of 138 slices shown]
[im 35/138  soft-tissue]
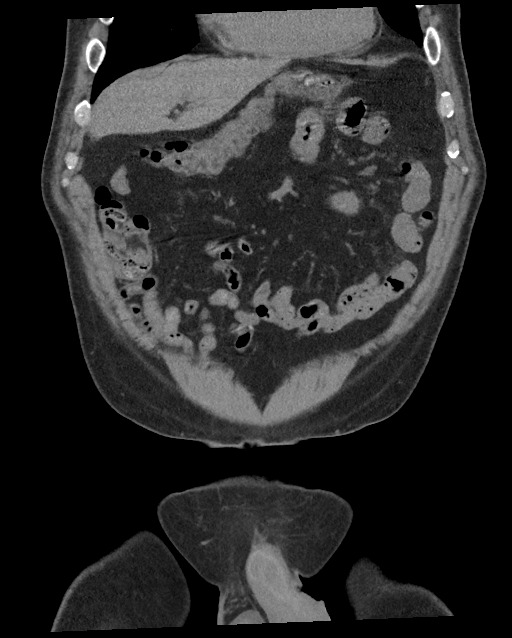
[im 69/138  soft-tissue]
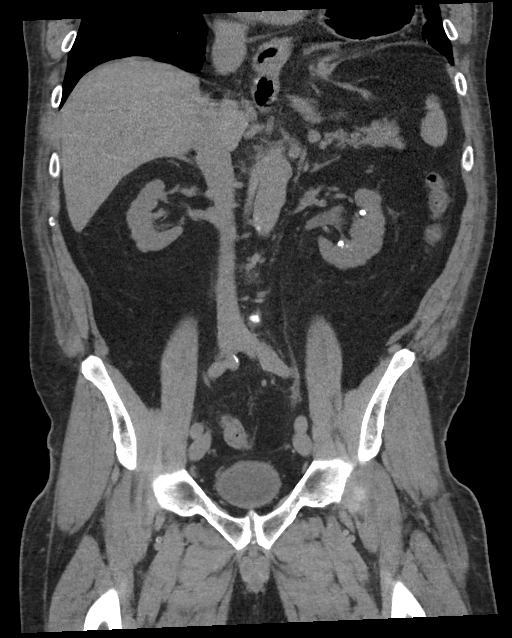
[im 103/138  soft-tissue]
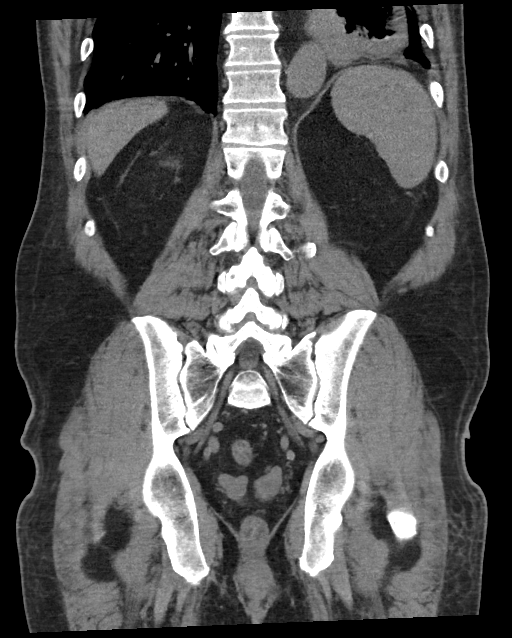

[14 of 46 positions shown; findings below may reference images not displayed]

FINDINGS: Lower chest: Hiatal hernia with intrathoracic stomach. Coronary
atherosclerosis.

Hepatobiliary: No focal liver abnormality.Cholecystectomy. Normal
common bile duct diameter.

Pancreas: Unremarkable.

Spleen: Unremarkable.

Adrenals/Urinary Tract: Negative adrenals. 6 x 7 mm stone in the
distal left ureter, 3 cm proximal to the UVJ. No hydronephrosis.
There are multiple bilateral renal calculi, at least 9 on the right
and 12 on the left. Bilateral stone burden has increased from prior.
The largest bilateral calculi measures 7 mm. Partially distended
urinary bladder which may account for mild generalized thick-walled
appearance

Stomach/Bowel: Hiatal hernia as noted above. Colonic diverticulosis.
Negative for inflammation.

Vascular/Lymphatic: Mild atherosclerotic calcification of the aorta
and iliacs. No mass or adenopathy.

Reproductive:Mild prostatic calcification.

Other: No ascites or pneumoperitoneum. Probable prior right inguinal
hernia repair

Musculoskeletal: Changes of bilateral femoral head avascular
necrosis with geographic sclerosis. Negative for collapse. Lumbar
spondylosis.
IMPRESSION: 1. 6 x 7 mm left ureteral stone located 3 cm above the UVJ. No
hydronephrosis.
2. Numerous bilateral renal calculi that have increased since [DATE]. Hiatal hernia with intrathoracic stomach.
4.  Aortic Atherosclerosis ([id number]).  Coronary atherosclerosis.
5. AVN of both femoral heads.

## 2019-05-10 ENCOUNTER — Other Ambulatory Visit: Payer: Self-pay | Admitting: Family Medicine

## 2019-06-24 ENCOUNTER — Other Ambulatory Visit: Payer: Self-pay

## 2019-06-24 ENCOUNTER — Ambulatory Visit: Payer: Medicare Other | Attending: Internal Medicine

## 2019-06-24 DIAGNOSIS — Z23 Encounter for immunization: Secondary | ICD-10-CM

## 2019-06-24 NOTE — Progress Notes (Signed)
   Covid-19 Vaccination Clinic  Name:  James Santiago    MRN: [id number] DOB: 13-Jul-1950  06/24/2019  James Santiago was observed post Covid-19 immunization for 30 minutes based on pre-vaccination screening without incident. He was provided with Vaccine Information Sheet and instruction to access the V-Safe system.   James Santiago was instructed to call 911 with any severe reactions post vaccine: Marland Kitchen Difficulty breathing  . Swelling of face and throat  . A fast heartbeat  . A bad rash all over body  . Dizziness and weakness   Immunizations Administered    Name Date Dose VIS Date Route   Pfizer COVID-19 Vaccine 06/24/2019  8:56 AM 0.3 mL 04/08/2018 Intramuscular   Manufacturer: [place]   Lot: [id number]   [place]: [id number]

## 2019-07-14 ENCOUNTER — Other Ambulatory Visit: Payer: Self-pay

## 2019-07-14 MED ORDER — AMLODIPINE BESYLATE 2.5 MG PO TABS: 2.5000 mg | ORAL_TABLET | Freq: Every day | ORAL | 3 refills | Status: DC

## 2019-07-17 ENCOUNTER — Encounter: Payer: Self-pay | Admitting: Gastroenterology

## 2019-07-21 ENCOUNTER — Ambulatory Visit: Payer: Medicare Other | Attending: Internal Medicine

## 2019-07-21 DIAGNOSIS — Z23 Encounter for immunization: Secondary | ICD-10-CM

## 2019-07-21 NOTE — Progress Notes (Signed)
   Covid-19 Vaccination Clinic  Name:  James Santiago    MRN: [id number] DOB: 11/27/1950  07/21/2019  James Santiago was observed post Covid-19 immunization for 15 minutes without incident. He was provided with Vaccine Information Sheet and instruction to access the V-Safe system.   James Santiago was instructed to call 911 with any severe reactions post vaccine: Marland Kitchen Difficulty breathing  . Swelling of face and throat  . A fast heartbeat  . A bad rash all over body  . Dizziness and weakness   Immunizations Administered    Name Date Dose VIS Date Route   Pfizer COVID-19 Vaccine 07/21/2019  8:36 AM 0.3 mL 04/08/2018 Intramuscular   Manufacturer: [place]   Lot: [id number]   [place]: [id number]

## 2019-07-24 ENCOUNTER — Other Ambulatory Visit: Payer: Self-pay | Admitting: Family Medicine

## 2019-07-24 DIAGNOSIS — I10 Essential (primary) hypertension: Secondary | ICD-10-CM

## 2019-09-08 ENCOUNTER — Ambulatory Visit: Payer: Medicare Other | Admitting: Gastroenterology

## 2019-09-15 ENCOUNTER — Other Ambulatory Visit: Payer: Self-pay

## 2019-09-15 ENCOUNTER — Ambulatory Visit (INDEPENDENT_AMBULATORY_CARE_PROVIDER_SITE_OTHER): Payer: Medicare Other | Admitting: Family Medicine

## 2019-09-15 VITALS — BP 100/80 | HR 92 | Temp 98.1°F | Ht 75.0 in | Wt 239.0 lb

## 2019-09-15 DIAGNOSIS — J069 Acute upper respiratory infection, unspecified: Secondary | ICD-10-CM | POA: Diagnosis not present

## 2019-09-15 MED ORDER — AMOXICILLIN 875 MG PO TABS: 875.0000 mg | ORAL_TABLET | Freq: Two times a day (BID) | ORAL | 0 refills | Status: DC

## 2019-09-15 NOTE — Progress Notes (Signed)
Subjective:    Patient ID: James Santiago, male    DOB: 1950/06/12, 69 y.o.   MRN: [id number]  Patient symptoms began approximately 10 days ago.  They occurred after he spent the entire day around his 79-month-old granddaughter.  Shortly thereafter she was diagnosed with RSV.  She also had double ear infections.  She did not require hospitalization.  The patient then developed head congestion, rhinorrhea, postnasal drip.  He also developed pressure in both ears.  He does have an occasional cough due to postnasal drip but he denies any chest pain or shortness of breath or wheezing.  His biggest complaint is thick copious nasal secretions.  He has a difficult time breathing through his nose.  He denies any headache or sinus pain. Past Medical History:  Diagnosis Date  . A-fib ([place])    a. Dx 01/2012, CHADS2 = 1.-[facility] in past.  . Adenomatous colon polyp 06/2003  . Arthritis    "fingers" (02/01/2012)  . Biliary dyskinesia   . Cholelithiasis    "still got 2 small stones in there" (02/01/2012)  . Esophageal stricture   . GERD (gastroesophageal reflux disease)   . Gout   . Hemorrhoids   . Hiatal hernia   . Hypercholesteremia   . Hypertension   . Nephrolithiasis    "was told kidney function at 65%"- hx. kidney stones   Past Surgical History:  Procedure Laterality Date  . CARDIOVERSION  03/06/2012   Procedure: CARDIOVERSION;  Surgeon: Larey Dresser, MD;  Location: [facility] ENDOSCOPY;  Service: Cardiovascular;  Laterality: N/A;  . CHOLECYSTECTOMY N/A 11/13/2012   Procedure: LAPAROSCOPIC CHOLECYSTECTOMY;  Surgeon: Rolm Bookbinder, MD;  Location: [facility] ORS;  Service: General;  Laterality: N/A;  . COLONOSCOPY    . CYSTOSCOPY W/ URETEROSCOPY W/ LITHOTRIPSY  1980's  . CYSTOSCOPY WITH RETROGRADE PYELOGRAM, URETEROSCOPY AND STENT PLACEMENT  1980's  . ESOPHAGEAL DILATION    . INGUINAL HERNIA REPAIR  ? 2008   "right" (02/01/2012)  . POLYPECTOMY    . VASECTOMY     Current Outpatient Medications  on File Prior to Visit  Medication Sig Dispense Refill  . allopurinol (ZYLOPRIM) 300 MG tablet TAKE 1 TABLET BY MOUTH  DAILY 90 tablet 3  . amLODipine (NORVASC) 2.5 MG tablet Take 1 tablet (2.5 mg total) by mouth daily. 30 tablet 3  . atenolol (TENORMIN) 50 MG tablet TAKE 1 TABLET BY MOUTH  DAILY 90 tablet 3  . DEXILANT 60 MG capsule TAKE 1 CAPSULE BY MOUTH  DAILY 90 capsule 3  . doxazosin (CARDURA) 4 MG tablet TAKE 1 TABLET BY MOUTH  DAILY 90 tablet 3  . ELIQUIS 5 MG TABS tablet TAKE 1 TABLET BY MOUTH  TWICE DAILY 180 tablet 3  . simvastatin (ZOCOR) 10 MG tablet TAKE 1 TABLET BY MOUTH AT  BEDTIME 90 tablet 3  . telmisartan (MICARDIS) 80 MG tablet TAKE 1 TABLET BY MOUTH IN  THE MORNING 90 tablet 3   No current facility-administered medications on file prior to visit.   Allergies  Allergen Reactions  . Augmentin [Amoxicillin-Pot Clavulanate]     Diarrhea and sick   Social History   Socioeconomic History  . Marital status: Married    Spouse name: Not on file  . Number of children: 1  . Years of education: Not on file  . Highest education level: Not on file  Occupational History    Employer: UNEMPLOYED  Tobacco Use  . Smoking status: Former Smoker    Packs/day: 0.50  Years: 12.00    Pack years: 6.00    Types: Cigarettes  . Smokeless tobacco: Current User    Types: Chew  . Tobacco comment: 02/01/2012 smoked cigarettes "probably 30 yr ago."  Currently uses 1/2 pouch of chewing tobacco daily; offered cessation materials; pt declines.  Substance and Sexual Activity  . Alcohol use: Yes    Alcohol/week: 0.0 standard drinks    Comment: occ. beer  . Drug use: No  . Sexual activity: Not Currently  Other Topics Concern  . Not on file  Social History Narrative   Lives in [place] with wife.  They have one grown child and a grandson.  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   Social Determinants of Health   Financial Resource Strain:   . Difficulty of  Paying Living Expenses:   Food Insecurity:   . Worried About Charity fundraiser in the Last Year:   . Arboriculturist in the Last Year:   Transportation Needs:   . Film/video editor (Medical):   Marland Kitchen Lack of Transportation (Non-Medical):   Physical Activity:   . Days of Exercise per Week:   . Minutes of Exercise per Session:   Stress:   . Feeling of Stress :   Social Connections:   . Frequency of Communication with Friends and Family:   . Frequency of Social Gatherings with Friends and Family:   . Attends Religious Services:   . Active Member of Clubs or Organizations:   . Attends Archivist Meetings:   Marland Kitchen Marital Status:   Intimate Partner Violence:   . Fear of Current or Ex-Partner:   . Emotionally Abused:   Marland Kitchen Physically Abused:   . Sexually Abused:       Review of Systems  All other systems reviewed and are negative.      Objective:   Physical Exam Vitals reviewed.  Constitutional:      Appearance: He is well-developed.  HENT:     Right Ear: Tympanic membrane, ear canal and external ear normal.     Left Ear: Tympanic membrane, ear canal and external ear normal.     Nose: Mucosal edema, congestion and rhinorrhea present.     Right Sinus: No maxillary sinus tenderness or frontal sinus tenderness.     Left Sinus: No maxillary sinus tenderness or frontal sinus tenderness.     Mouth/Throat:     Pharynx: No oropharyngeal exudate.  Eyes:     Conjunctiva/sclera: Conjunctivae normal.  Cardiovascular:     Rate and Rhythm: Normal rate and regular rhythm.     Heart sounds: Normal heart sounds.  Pulmonary:     Effort: Pulmonary effort is normal. No respiratory distress.     Breath sounds: Normal breath sounds. No wheezing or rales.  Musculoskeletal:     Cervical back: Neck supple.  Lymphadenopathy:     Cervical: No cervical adenopathy.           Assessment & Plan:  URI, acute - Plan: SARS-COV-2 RNA,(COVID-19) QUAL NAAT  Patient appears to have a  viral upper respiratory infection most likely consistent with RSV.  He is already had Covid at Christmas time.  He also has had the Covid vaccine.  However patient does request to be tested for COVID-19 today.  Recommended symptomatic therapy including Coricidin HBP for head congestion.  If patient develops pain and pressure in his sinuses he may develop a secondary bacterial sinus infection which he could  treat with amoxicillin however I see no evidence of that today on his exam.  Recommended tincture of time.  Symptoms should gradually improve over the next week.

## 2019-09-16 LAB — SARS-COV-2 RNA,(COVID-19) QUALITATIVE NAAT: SARS CoV2 RNA: NOT DETECTED

## 2019-09-24 ENCOUNTER — Encounter: Payer: Self-pay | Admitting: Family Medicine

## 2019-09-28 ENCOUNTER — Ambulatory Visit: Payer: Medicare Other | Admitting: Family Medicine

## 2019-10-08 ENCOUNTER — Other Ambulatory Visit: Payer: Self-pay | Admitting: Family Medicine

## 2019-10-11 ENCOUNTER — Other Ambulatory Visit: Payer: Self-pay | Admitting: Family Medicine

## 2019-10-14 ENCOUNTER — Encounter: Payer: Self-pay | Admitting: Family Medicine

## 2019-10-14 MED ORDER — AMLODIPINE BESYLATE 2.5 MG PO TABS: 2.5000 mg | ORAL_TABLET | Freq: Every day | ORAL | 3 refills | Status: DC

## 2019-11-05 ENCOUNTER — Encounter: Payer: Self-pay | Admitting: Gastroenterology

## 2019-11-05 ENCOUNTER — Ambulatory Visit: Payer: Medicare Other | Admitting: Gastroenterology

## 2019-11-05 VITALS — BP 128/80 | HR 87 | Ht 74.0 in | Wt 243.5 lb

## 2019-11-05 DIAGNOSIS — K219 Gastro-esophageal reflux disease without esophagitis: Secondary | ICD-10-CM | POA: Diagnosis not present

## 2019-11-05 DIAGNOSIS — Z7901 Long term (current) use of anticoagulants: Secondary | ICD-10-CM | POA: Diagnosis not present

## 2019-11-05 DIAGNOSIS — Z86010 Personal history of colonic polyps: Secondary | ICD-10-CM

## 2019-11-05 MED ORDER — NA SULFATE-K SULFATE-MG SULF 17.5-3.13-1.6 GM/177ML PO SOLN: 1.0000 | Freq: Once | ORAL | 0 refills | Status: AC

## 2019-11-05 NOTE — Progress Notes (Signed)
History of Present Illness: This is a 69 year old male referred by Susy Frizzle, MD for the evaluation of personal history of adenomatous colon polyps.  He is accompanied by his wife.  Adenomatous colon polyps were previously noted on colonoscopy in 2005.  Most recent colonoscopy is below and was polyp free.  He relates his change in bowel habits over the past several months with alternating looser stools and mild constipation.  He has frequent reflux symptoms generally related to certain foods or eating late at night.  No dysphagia.  He is maintained on Eliquis for atrial fibrillation. Denies weight loss, abdominal pain, change in stool caliber, melena, hematochezia, nausea, vomiting, dysphagia, chest pain.  Colonoscopy 06/2014 1. Mild diverticulosis in the sigmoid colon 2. The colonic mucosa otherwise appeared normal 3. Grade I internal hemorrhoids  EGD 04/212 Distal esophageal stricture Large hiatal hernia   Allergies  Allergen Reactions  . Augmentin [Amoxicillin-Pot Clavulanate]     Diarrhea and sick   Outpatient Medications Prior to Visit  Medication Sig Dispense Refill  . allopurinol (ZYLOPRIM) 300 MG tablet TAKE 1 TABLET BY MOUTH  DAILY 90 tablet 3  . amLODipine (NORVASC) 2.5 MG tablet TAKE 1 TABLET BY MOUTH EVERY DAY (Patient taking differently: Take 2.5 mg by mouth as needed. ) 90 tablet 1  . atenolol (TENORMIN) 50 MG tablet TAKE 1 TABLET BY MOUTH  DAILY 90 tablet 3  . DEXILANT 60 MG capsule TAKE 1 CAPSULE BY MOUTH  DAILY 90 capsule 3  . doxazosin (CARDURA) 4 MG tablet TAKE 1 TABLET BY MOUTH  DAILY 90 tablet 3  . ELIQUIS 5 MG TABS tablet TAKE 1 TABLET BY MOUTH  TWICE DAILY 180 tablet 3  . simvastatin (ZOCOR) 10 MG tablet TAKE 1 TABLET BY MOUTH AT  BEDTIME 90 tablet 3  . telmisartan (MICARDIS) 80 MG tablet TAKE 1 TABLET BY MOUTH IN  THE MORNING 90 tablet 3  . doxazosin (CARDURA) 2 MG tablet Take 2 mg by mouth daily.    Marland Kitchen amoxicillin (AMOXIL) 875 MG tablet Take 1 tablet  (875 mg total) by mouth 2 (two) times daily. 20 tablet 0   No facility-administered medications prior to visit.   Past Medical History:  Diagnosis Date  . A-fib ([place])    a. Dx 01/2012, CHADS2 = 1.-[facility] in past.  . Adenomatous colon polyp 06/2003  . Arthritis    "fingers" (02/01/2012)  . Atrial fibrillation ([place])   . Biliary dyskinesia   . Cholelithiasis    "still got 2 small stones in there" (02/01/2012)  . Esophageal stricture   . GERD (gastroesophageal reflux disease)   . Gout   . Hemorrhoids   . Hiatal hernia   . History of colon polyps   . Hypercholesteremia   . Hypertension   . Kidney stone   . Nephrolithiasis    "was told kidney function at 65%"- hx. kidney stones   Past Surgical History:  Procedure Laterality Date  . CARDIOVERSION  03/06/2012   Procedure: CARDIOVERSION;  Surgeon: Larey Dresser, MD;  Location: [facility] ENDOSCOPY;  Service: Cardiovascular;  Laterality: N/A;  . CHOLECYSTECTOMY N/A 11/13/2012   Procedure: LAPAROSCOPIC CHOLECYSTECTOMY;  Surgeon: Rolm Bookbinder, MD;  Location: [facility] ORS;  Service: General;  Laterality: N/A;  . COLONOSCOPY    . CYSTOSCOPY W/ URETEROSCOPY W/ LITHOTRIPSY  1980's  . CYSTOSCOPY WITH RETROGRADE PYELOGRAM, URETEROSCOPY AND STENT PLACEMENT  1980's  . ESOPHAGEAL DILATION    . INGUINAL HERNIA REPAIR  ? 2008   "right" (  02/01/2012)  . POLYPECTOMY    . VASECTOMY     Social History   Socioeconomic History  . Marital status: Married    Spouse name: Not on file  . Number of children: 1  . Years of education: Not on file  . Highest education level: Not on file  Occupational History    Employer: UNEMPLOYED  Tobacco Use  . Smoking status: Former Smoker    Packs/day: 0.50    Years: 12.00    Pack years: 6.00    Types: Cigarettes  . Smokeless tobacco: Current User    Types: Chew  . Tobacco comment: 02/01/2012 smoked cigarettes "probably 30 yr ago."  Currently uses 1/2 pouch of chewing tobacco daily; offered cessation materials; pt  declines.  Vaping Use  . Vaping Use: Never used  Substance and Sexual Activity  . Alcohol use: Not Currently    Alcohol/week: 0.0 standard drinks    Comment: occ. beer  . Drug use: No  . Sexual activity: Not Currently  Other Topics Concern  . Not on file  Social History Narrative   Lives in [place] with wife.  They have one grown child and a grandson.  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   Social Determinants of Health   Financial Resource Strain:   . Difficulty of Paying Living Expenses: Not on file  Food Insecurity:   . Worried About Charity fundraiser in the Last Year: Not on file  . Ran Out of Food in the Last Year: Not on file  Transportation Needs:   . Lack of Transportation (Medical): Not on file  . Lack of Transportation (Non-Medical): Not on file  Physical Activity:   . Days of Exercise per Week: Not on file  . Minutes of Exercise per Session: Not on file  Stress:   . Feeling of Stress : Not on file  Social Connections:   . Frequency of Communication with Friends and Family: Not on file  . Frequency of Social Gatherings with Friends and Family: Not on file  . Attends Religious Services: Not on file  . Active Member of Clubs or Organizations: Not on file  . Attends Archivist Meetings: Not on file  . Marital Status: Not on file   Family History  Problem Relation Age of Onset  . Lung cancer Father        died @ 21.  Also had PPM  . Heart disease Father   . Kidney disease Mother        died @ 86. Also had h/o CVA, breat cancer, diabetes, and PPM  . Breast cancer Mother   . Diabetes Mother   . Heart disease Mother   . Cancer Brother        esophagus  . Colon cancer Neg Hx   . Rectal cancer Neg Hx   . Stomach cancer Neg Hx       Review of Systems: Pertinent positive and negative review of systems were noted in the above HPI section. All other review of systems were otherwise negative.   Physical Exam: General:  Well developed, well nourished, no acute distress Head: Normocephalic and atraumatic Eyes:  sclerae anicteric, EOMI Ears: Normal auditory acuity Mouth: Not examined, mask on during Covid-19 pandemic Neck: Supple, no masses or thyromegaly Lungs: Clear throughout to auscultation Heart: Regular rate and rhythm; no murmurs, rubs or bruits Abdomen: Soft, non tender and non distended. No masses, hepatosplenomegaly or hernias noted. Normal  Bowel sounds Rectal: Deferred to colonoscopy  Musculoskeletal: Symmetrical with no gross deformities  Skin: No lesions on visible extremities Pulses:  Normal pulses noted Extremities: No clubbing, cyanosis, edema or deformities noted Neurological: Alert oriented x 4, grossly nonfocal Cervical Nodes:  No significant cervical adenopathy Inguinal Nodes: No significant inguinal adenopathy Psychological:  Alert and cooperative. Normal mood and affect   Assessment and Recommendations:  1. Personal history of adenomatous colon polyps. He is due for surveillance colonoscopy. The risks (including bleeding, perforation, infection, missed lesions, medication reactions and possible hospitalization or surgery if complications occur), benefits, and alternatives to colonoscopy with possible biopsy and possible polypectomy were discussed with the patient and they consent to proceed.   2. GERD, history of esophageal stricture and a large hiatal hernia.  Follow antireflux measures. Continue Dexilant 60 mg po qd and Famotidine 20 mg po qhs.  3. Afib on Eliquis. Hold Eliquis 2 days before procedure - will instruct when and how to resume after procedure. Low but real risk of cardiovascular event such as heart attack, stroke, embolism, thrombosis or ischemia/infarct of other organs off Eliquis explained and need to seek urgent help if this occurs. The patient consents to proceed. Will communicate by phone or EMR with patient's prescribing provider to confirm that holding Eliquis is  reasonable in this case.     cc: Susy Frizzle, MD [address] [place],  [place] [id number]

## 2019-11-05 NOTE — Patient Instructions (Addendum)
If you are age 68 or older, your body mass index should be between 23-30. Your Body mass index is 31.26 kg/m. If this is out of the aforementioned range listed, please consider follow up with your Primary Care Provider.  If you are age 15 or younger, your body mass index should be between 19-25. Your Body mass index is 31.26 kg/m. If this is out of the aformentioned range listed, please consider follow up with your Primary Care Provider.   You have been scheduled for a colonoscopy. Please follow written instructions given to you at your visit today.  Please pick up your prep supplies at the pharmacy within the next 1-3 days. If you use inhalers (even only as needed), please bring them with you on the day of your procedure.   Thank you for choosing me and [facility].  Pricilla Riffle. Dagoberto Ligas., MD., Marval Regal

## 2019-11-16 ENCOUNTER — Telehealth: Payer: Self-pay

## 2019-11-16 NOTE — Telephone Encounter (Signed)
Received fax from Dr. Dennard Schaumann that patient can hold Eliquis 2 days prior to his procedure. Resume afterwards. Called patient with no answer and voicemail box is full, could not leave a message.

## 2019-11-16 NOTE — Telephone Encounter (Signed)
RE: James Santiago DOB: 08-Jun-1950 MRN: [id number]   Dear Dr. Jenna Luo,    We have scheduled the above patient for an endoscopic procedure. Our records show that he is on anticoagulation therapy.   Please advise as to how long the patient may come off his therapy of Eliquis two days prior to the procedure, which is scheduled for 01/04/20.  Please fax back/ or route the completed form to [facility]  at [phone].   Sincerely,    Darrall Dears

## 2019-11-18 NOTE — Telephone Encounter (Signed)
Called patient on mobile number with no answer and voicemail box is full.

## 2019-11-18 NOTE — Telephone Encounter (Signed)
Call home number with no answer and no voicemail.

## 2019-11-19 NOTE — Telephone Encounter (Signed)
Called patient with no answer and voicemail box is full. Could not leave a message.

## 2019-11-20 NOTE — Telephone Encounter (Signed)
Called patient on both phone numbers with no answer and could not leave a message.

## 2019-11-25 ENCOUNTER — Other Ambulatory Visit: Payer: Self-pay | Admitting: Family Medicine

## 2019-12-02 ENCOUNTER — Encounter: Payer: Self-pay | Admitting: Family Medicine

## 2019-12-02 NOTE — Telephone Encounter (Signed)
Called patient on both numbers with no answer and no voicemail to leave a message. Will mail letter.

## 2019-12-03 ENCOUNTER — Ambulatory Visit
Admission: EM | Admit: 2019-12-03 | Discharge: 2019-12-03 | Disposition: A | Discharge status: Home / Self Care | Payer: Medicare Other

## 2019-12-03 DIAGNOSIS — J209 Acute bronchitis, unspecified: Secondary | ICD-10-CM

## 2019-12-03 DIAGNOSIS — J0110 Acute frontal sinusitis, unspecified: Secondary | ICD-10-CM | POA: Diagnosis not present

## 2019-12-03 MED ORDER — PREDNISONE 10 MG (21) PO TBPK: ORAL_TABLET | Freq: Every day | ORAL | 0 refills | Status: AC

## 2019-12-03 NOTE — Discharge Instructions (Addendum)
You have a sinus infection as well as bronchitis  I have sent in a prednisone taper for you to take for 6 days. 6 tablets on day one, 5 tablets on day two, 4 tablets on day three, 3 tablets on day four, 2 tablets on day five, and 1 tablet on day six.  Take your amoxicillin as prescribed  Follow up with this office or with primary care as needed

## 2019-12-03 NOTE — ED Provider Notes (Signed)
[facility]   [id number] 12/03/19 Arrival Time: 1130  TG:YBWL THROAT  SUBJECTIVE: History from: patient.  James Santiago is a 69 y.o. male who presents with abrupt onset of nasal congestion, headache, fatigue for the last week. Reports that he is coughing up dark green sputum. Denies sick exposure to Covid, strep, flu or mono, or precipitating event. Reports that he is concerned about his lungs today. Reports that he has a new prescription for amoxicillin from his PCP for a sinus infection. Has tried mucinex without relief.  There are no aggravating symptoms. Denies previous symptoms in the past.     Denies fever, chills, ear pain, sinus pain, rhinorrhea, cough, SOB, chest pain, nausea, rash, changes in bowel or bladder habits.    ROS: As per HPI.  All other pertinent ROS negative.     Past Medical History:  Diagnosis Date  . A-fib ([place])    a. Dx 01/2012, CHADS2 = 1.-[place] in past.  . Adenomatous colon polyp 06/2003  . Arthritis    "fingers" (02/01/2012)  . Atrial fibrillation ([place])   . Biliary dyskinesia   . Cholelithiasis    "still got 2 small stones in there" (02/01/2012)  . Esophageal stricture   . GERD (gastroesophageal reflux disease)   . Gout   . Hemorrhoids   . Hiatal hernia   . History of colon polyps   . Hypercholesteremia   . Hypertension   . Kidney stone   . Nephrolithiasis    "was told kidney function at 65%"- hx. kidney stones   Past Surgical History:  Procedure Laterality Date  . CARDIOVERSION  03/06/2012   Procedure: CARDIOVERSION;  Surgeon: Larey Dresser, MD;  Location: [facility] ENDOSCOPY;  Service: Cardiovascular;  Laterality: N/A;  . CHOLECYSTECTOMY N/A 11/13/2012   Procedure: LAPAROSCOPIC CHOLECYSTECTOMY;  Surgeon: Rolm Bookbinder, MD;  Location: [facility] ORS;  Service: General;  Laterality: N/A;  . COLONOSCOPY    . CYSTOSCOPY W/ URETEROSCOPY W/ LITHOTRIPSY  1980's  . CYSTOSCOPY WITH RETROGRADE PYELOGRAM, URETEROSCOPY AND STENT PLACEMENT   1980's  . ESOPHAGEAL DILATION    . INGUINAL HERNIA REPAIR  ? 2008   "right" (02/01/2012)  . POLYPECTOMY    . VASECTOMY     Allergies  Allergen Reactions  . Augmentin [Amoxicillin-Pot Clavulanate]     Diarrhea and sick   No current facility-administered medications on file prior to encounter.   Current Outpatient Medications on File Prior to Encounter  Medication Sig Dispense Refill  . Dextromethorphan-guaiFENesin ([place] DM PO) Take by mouth. Last dose this am    . allopurinol (ZYLOPRIM) 300 MG tablet TAKE 1 TABLET BY MOUTH  DAILY 90 tablet 3  . amLODipine (NORVASC) 2.5 MG tablet TAKE 1 TABLET BY MOUTH EVERY DAY (Patient taking differently: Take 2.5 mg by mouth as needed. ) 90 tablet 1  . atenolol (TENORMIN) 50 MG tablet TAKE 1 TABLET BY MOUTH  DAILY 90 tablet 3  . DEXILANT 60 MG capsule TAKE 1 CAPSULE BY MOUTH  DAILY 90 capsule 3  . doxazosin (CARDURA) 4 MG tablet TAKE 1 TABLET BY MOUTH  DAILY 90 tablet 3  . ELIQUIS 5 MG TABS tablet TAKE 1 TABLET BY MOUTH  TWICE DAILY 180 tablet 3  . simvastatin (ZOCOR) 10 MG tablet TAKE 1 TABLET BY MOUTH AT  BEDTIME 90 tablet 3  . telmisartan (MICARDIS) 80 MG tablet TAKE 1 TABLET BY MOUTH IN  THE MORNING 90 tablet 3   Social History   Socioeconomic History  . Marital status:  Married    Spouse name: Not on file  . Number of children: 1  . Years of education: Not on file  . Highest education level: Not on file  Occupational History    Employer: UNEMPLOYED  Tobacco Use  . Smoking status: Former Smoker    Packs/day: 0.50    Years: 12.00    Pack years: 6.00    Types: Cigarettes  . Smokeless tobacco: Current User    Types: Chew  . Tobacco comment: 02/01/2012 smoked cigarettes "probably 30 yr ago."  Currently uses 1/2 pouch of chewing tobacco daily; offered cessation materials; pt declines.  Vaping Use  . Vaping Use: Never used  Substance and Sexual Activity  . Alcohol use: Not Currently    Alcohol/week: 0.0 standard drinks    Comment:  occ. beer  . Drug use: No  . Sexual activity: Not Currently  Other Topics Concern  . Not on file  Social History Narrative   Lives in [place] with wife.  They have one grown child and a grandson.  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   Social Determinants of Health   Financial Resource Strain:   . Difficulty of Paying Living Expenses: Not on file  Food Insecurity:   . Worried About Charity fundraiser in the Last Year: Not on file  . Ran Out of Food in the Last Year: Not on file  Transportation Needs:   . Lack of Transportation (Medical): Not on file  . Lack of Transportation (Non-Medical): Not on file  Physical Activity:   . Days of Exercise per Week: Not on file  . Minutes of Exercise per Session: Not on file  Stress:   . Feeling of Stress : Not on file  Social Connections:   . Frequency of Communication with Friends and Family: Not on file  . Frequency of Social Gatherings with Friends and Family: Not on file  . Attends Religious Services: Not on file  . Active Member of Clubs or Organizations: Not on file  . Attends Archivist Meetings: Not on file  . Marital Status: Not on file  Intimate Partner Violence:   . Fear of Current or Ex-Partner: Not on file  . Emotionally Abused: Not on file  . Physically Abused: Not on file  . Sexually Abused: Not on file   Family History  Problem Relation Age of Onset  . Lung cancer Father        died @ 52.  Also had PPM  . Heart disease Father   . Kidney disease Mother        died @ 12. Also had h/o CVA, breat cancer, diabetes, and PPM  . Breast cancer Mother   . Diabetes Mother   . Heart disease Mother   . Cancer Brother        esophagus  . Colon cancer Neg Hx   . Rectal cancer Neg Hx   . Stomach cancer Neg Hx     OBJECTIVE:  Vitals:   12/03/19 1217  BP: (!) 142/87  Pulse: 74  Resp: 17  Temp: 98.7 F (37.1 C)  TempSrc: Oral  SpO2: 96%     General appearance: alert; appears  fatigued, but nontoxic, speaking in full sentences and managing own secretions HEENT: NCAT; Ears: EACs clear, TMs pearly gray with visible cone of light, without erythema; Eyes: PERRL, EOMI grossly; Nose: no obvious rhinorrhea; Throat: oropharynx clear, tonsils 1+ and mildly erythematous without white tonsillar  exudates, uvula midline; Sinuses: maxillary sinuses tender to palpation Neck: supple without LAD Lungs:wheezing in bilateral lower lobes; cough absent Heart: regular rate and rhythm.  Radial pulses 2+ symmetrical bilaterally Skin: warm and dry Psychological: alert and cooperative; normal mood and affect  LABS: No results found for this or any previous visit (from the past 24 hour(s)).   ASSESSMENT & PLAN:  1. Acute non-recurrent frontal sinusitis   2. Acute bronchitis, unspecified organism     Meds ordered this encounter  Medications  . predniSONE (STERAPRED UNI-PAK 21 TAB) 10 MG (21) TBPK tablet    Sig: Take by mouth daily for 6 days. Take 6 tablets on day 1, 5 tablets on day 2, 4 tablets on day 3, 3 tablets on day 4, 2 tablets on day 5, 1 tablet on day 6    Dispense:  21 tablet    Refill:  0    Order Specific Question:   Supervising Provider    Answer:   Chase Picket [[id number]]    Acute Sinusitis Push fluids and get rest Take amoxicillin as prescribed Prescribed steroid taper  Take as directed and to completion.  Drink warm or cool liquids, use throat lozenges, or popsicles to help alleviate symptoms Take OTC ibuprofen or tylenol as needed for pain May use Zyrtec D and flonase to help alleviate symptoms Follow up with PCP if symptoms persist Return or go to ER if you have any new or worsening symptoms such as fever, chills, nausea, vomiting, worsening sore throat, cough, abdominal pain, chest pain, changes in bowel or bladder habits.   Reviewed expectations re: course of current medical issues. Questions answered. Outlined signs and symptoms indicating need for  more acute intervention. Patient verbalized understanding. After Visit Summary given.          Faustino Congress, NP 12/03/19 1233

## 2019-12-03 NOTE — Telephone Encounter (Signed)
Informed patient per Dr. Dennard Schaumann, he can hold Eliquis 2 days prior to his procedure. Patient verbalized understanding.

## 2019-12-03 NOTE — ED Triage Notes (Signed)
Patient presents to Urgent Care with complains of productive cough since x1 week. Patient reports pressure in right ear.

## 2019-12-18 ENCOUNTER — Other Ambulatory Visit: Payer: Self-pay | Admitting: Family Medicine

## 2019-12-18 DIAGNOSIS — I10 Essential (primary) hypertension: Secondary | ICD-10-CM

## 2019-12-22 ENCOUNTER — Other Ambulatory Visit: Payer: Self-pay | Admitting: Family Medicine

## 2019-12-30 ENCOUNTER — Encounter: Payer: Self-pay | Admitting: Gastroenterology

## 2020-01-04 ENCOUNTER — Encounter: Payer: Self-pay | Admitting: Gastroenterology

## 2020-01-04 ENCOUNTER — Other Ambulatory Visit: Payer: Self-pay

## 2020-01-04 ENCOUNTER — Ambulatory Visit (AMBULATORY_SURGERY_CENTER): Payer: Medicare Other | Admitting: Gastroenterology

## 2020-01-04 VITALS — BP 119/86 | HR 71 | Temp 97.4°F | Resp 16 | Ht 74.0 in | Wt 243.0 lb

## 2020-01-04 DIAGNOSIS — D124 Benign neoplasm of descending colon: Secondary | ICD-10-CM | POA: Diagnosis not present

## 2020-01-04 DIAGNOSIS — Z86010 Personal history of colonic polyps: Secondary | ICD-10-CM

## 2020-01-04 MED ORDER — SODIUM CHLORIDE 0.9 % IV SOLN: 500.0000 mL | Freq: Once | INTRAVENOUS | Status: DC

## 2020-01-04 NOTE — Progress Notes (Signed)
Vs by CW -admitting

## 2020-01-04 NOTE — Progress Notes (Signed)
Report to PACU, RN, vss, BBS= Clear.  

## 2020-01-04 NOTE — Patient Instructions (Signed)
Handouts given for polyps, diverticulosis, hemorrhoids and High Fiber diet.  Resume your Eliquis at prior dose in 2 days.  Await pathology results.  YOU HAD AN ENDOSCOPIC PROCEDURE TODAY AT [facility]:   Refer to the procedure report that was given to you for any specific questions about what was found during the examination.  If the procedure report does not answer your questions, please call your gastroenterologist to clarify.  If you requested that your care partner not be given the details of your procedure findings, then the procedure report has been included in a sealed envelope for you to review at your convenience later.  YOU SHOULD EXPECT: Some feelings of bloating in the abdomen. Passage of more gas than usual.  Walking can help get rid of the air that was put into your GI tract during the procedure and reduce the bloating. If you had a lower endoscopy (such as a colonoscopy or flexible sigmoidoscopy) you may notice spotting of blood in your stool or on the toilet paper. If you underwent a bowel prep for your procedure, you may not have a normal bowel movement for a few days.  Please Note:  You might notice some irritation and congestion in your nose or some drainage.  This is from the oxygen used during your procedure.  There is no need for concern and it should clear up in a day or so.  SYMPTOMS TO REPORT IMMEDIATELY:   Following lower endoscopy (colonoscopy or flexible sigmoidoscopy):  Excessive amounts of blood in the stool  Significant tenderness or worsening of abdominal pains  Swelling of the abdomen that is new, acute  Fever of 100F or higher  For urgent or emergent issues, a gastroenterologist can be reached at any hour by calling [phone]. Do not use MyChart messaging for urgent concerns.    DIET:  We do recommend a small meal at first, but then you may proceed to your regular diet.  Drink plenty of fluids but you should avoid alcoholic beverages  for 24 hours.  ACTIVITY:  You should plan to take it easy for the rest of today and you should NOT DRIVE or use heavy machinery until tomorrow (because of the sedation medicines used during the test).    FOLLOW UP: Our staff will call the number listed on your records 48-72 hours following your procedure to check on you and address any questions or concerns that you may have regarding the information given to you following your procedure. If we do not reach you, we will leave a message.  We will attempt to reach you two times.  During this call, we will ask if you have developed any symptoms of COVID 19. If you develop any symptoms (ie: fever, flu-like symptoms, shortness of breath, cough etc.) before then, please call [phone].  If you test positive for Covid 19 in the 2 weeks post procedure, please call and report this information to [place].    If any biopsies were taken you will be contacted by phone or by letter within the next 1-3 weeks.  Please call us at [phone] if you have not heard about the biopsies in 3 weeks.    SIGNATURES/CONFIDENTIALITY: You and/or your care partner have signed paperwork which will be entered into your electronic medical record.  These signatures attest to the fact that that the information above on your After Visit Summary has been reviewed and is understood.  Full responsibility of the confidentiality of this discharge information  lies with you and/or your care-partner.

## 2020-01-04 NOTE — Op Note (Addendum)
[facility] Patient Name: James Santiago Procedure Date: 01/04/2020 7:57 AM MRN: [id number] Endoscopist: Ladene Artist , MD Age: 69 Referring MD:  Date of Birth: Apr 05, 1950 Gender: Male Account #: [id number] Procedure:                Colonoscopy Indications:              Surveillance: Personal history of adenomatous                            polyps on last colonoscopy 5 years ago Medicines:                Monitored Anesthesia Care Procedure:                Pre-Anesthesia Assessment:                           - Prior to the procedure, a History and Physical                            was performed, and patient medications and                            allergies were reviewed. The patient's tolerance of                            previous anesthesia was also reviewed. The risks                            and benefits of the procedure and the sedation                            options and risks were discussed with the patient.                            All questions were answered, and informed consent                            was obtained. Prior Anticoagulants: The patient has                            taken Eliquis (apixaban), last dose was 2 days                            prior to procedure. ASA Grade Assessment: II - A                            patient with mild systemic disease. After reviewing                            the risks and benefits, the patient was deemed in                            satisfactory condition to undergo the procedure.  After obtaining informed consent, the colonoscope                            was passed under direct vision. Throughout the                            procedure, the patient's blood pressure, pulse, and                            oxygen saturations were monitored continuously. The                            Colonoscope was introduced through the anus and                            advanced to  the the cecum, identified by                            appendiceal orifice and ileocecal valve. The                            ileocecal valve, appendiceal orifice, and rectum                            were photographed. The quality of the bowel                            preparation was good. The colonoscopy was performed                            without difficulty. The patient tolerated the                            procedure well. Scope In: 8:07:28 AM Scope Out: 8:18:16 AM Scope Withdrawal Time: 0 hours 9 minutes 23 seconds  Total Procedure Duration: 0 hours 10 minutes 48 seconds  Findings:                 The perianal and digital rectal examinations were                            normal.                           A 6 mm polyp was found in the descending colon. The                            polyp was sessile. The polyp was removed with a                            cold snare. Resection and retrieval were complete.                           A few medium-mouthed diverticula were found in the  left colon. There was no evidence of diverticular                            bleeding.                           Internal hemorrhoids were found during                            retroflexion. The hemorrhoids were small and Grade                            I (internal hemorrhoids that do not prolapse).                           The exam was otherwise without abnormality on                            direct and retroflexion views. Complications:            No immediate complications. Estimated blood loss:                            None. Estimated Blood Loss:     Estimated blood loss: none. Impression:               - One 6 mm polyp in the descending colon. Resected                            with a cold snare and retrieved.                           - Mild diverticulosis in the left colon.                           - Internal hemorrhoids.                            - The examination was otherwise normal on direct                            and retroflexion views.                           - No specimens collected. Recommendation:           - Repeat colonoscopy after studies are complete for                            surveillance based on pathology results.                           - Resume Eliquis (apixaban) in 2 days at prior                            dose. Refer to managing physician for further  adjustment of therapy.                           - Patient has a contact number available for                            emergencies. The signs and symptoms of potential                            delayed complications were discussed with the                            patient. Return to normal activities tomorrow.                            Written discharge instructions were provided to the                            patient.                           - High fiber diet.                           - Continue present medications.                           - Await pathology results. Ladene Artist, MD 01/04/2020 8:24:44 AM This report has been signed electronically.

## 2020-01-05 ENCOUNTER — Telehealth: Payer: Self-pay

## 2020-01-05 NOTE — Telephone Encounter (Signed)
Unable to leave message mailbox is full.

## 2020-01-21 ENCOUNTER — Encounter: Payer: Self-pay | Admitting: Gastroenterology

## 2020-02-27 ENCOUNTER — Other Ambulatory Visit: Payer: Self-pay | Admitting: Family Medicine

## 2020-03-04 ENCOUNTER — Encounter: Payer: Self-pay | Admitting: Family Medicine

## 2020-03-11 ENCOUNTER — Telehealth: Payer: Self-pay | Admitting: *Deleted

## 2020-03-11 NOTE — Telephone Encounter (Signed)
Received PA determination.   [id number] approved through 02/11/2021.

## 2020-03-11 NOTE — Telephone Encounter (Signed)
Received request from pharmacy for [place] on [address].   PA submitted.   Dx: K21.9- GERD  Your information has been sent to [facility].

## 2020-04-12 ENCOUNTER — Other Ambulatory Visit (HOSPITAL_COMMUNITY): Payer: Self-pay | Admitting: Internal Medicine

## 2020-04-12 ENCOUNTER — Ambulatory Visit: Payer: Medicare Other | Attending: Internal Medicine

## 2020-04-12 DIAGNOSIS — Z23 Encounter for immunization: Secondary | ICD-10-CM

## 2020-04-12 NOTE — Progress Notes (Signed)
   Covid-19 Vaccination Clinic  Name:  EXAVIER LINA    MRN: [id number] DOB: 01/10/1951  04/12/2020  Mr. Klemp was observed post Covid-19 immunization for 15 minutes without incident. He was provided with Vaccine Information Sheet and instruction to access the V-Safe system.   Mr. Yellowhair was instructed to call 911 with any severe reactions post vaccine: Marland Kitchen Difficulty breathing  . Swelling of face and throat  . A fast heartbeat  . A bad rash all over body  . Dizziness and weakness   Immunizations Administered    Name Date Dose VIS Date Route   PFIZER Comrnaty(Gray TOP) Covid-19 Vaccine 04/12/2020  9:38 AM 0.3 mL 01/21/2020 Intramuscular   Manufacturer: [employer]   Lot: [id number]   NDC: [phone]

## 2020-05-13 ENCOUNTER — Other Ambulatory Visit: Payer: Self-pay | Admitting: Family Medicine

## 2020-05-16 ENCOUNTER — Other Ambulatory Visit: Payer: Self-pay | Admitting: Family Medicine

## 2020-05-16 ENCOUNTER — Encounter: Payer: Self-pay | Admitting: Family Medicine

## 2020-05-16 DIAGNOSIS — N200 Calculus of kidney: Secondary | ICD-10-CM

## 2020-05-19 ENCOUNTER — Other Ambulatory Visit: Payer: Self-pay

## 2020-05-19 ENCOUNTER — Ambulatory Visit: Payer: Medicare Other | Admitting: Urology

## 2020-05-19 ENCOUNTER — Ambulatory Visit
Admission: RE | Admit: 2020-05-19 | Discharge: 2020-05-19 | Disposition: A | Discharge status: Home / Self Care | Payer: Medicare Other | Source: Ambulatory Visit | Attending: Urology | Admitting: Urology

## 2020-05-19 ENCOUNTER — Encounter: Payer: Self-pay | Admitting: Urology

## 2020-05-19 VITALS — BP 147/96 | HR 91 | Ht 74.0 in | Wt 243.0 lb

## 2020-05-19 DIAGNOSIS — N200 Calculus of kidney: Secondary | ICD-10-CM | POA: Diagnosis not present

## 2020-05-19 DIAGNOSIS — R310 Gross hematuria: Secondary | ICD-10-CM | POA: Diagnosis not present

## 2020-05-19 DIAGNOSIS — R109 Unspecified abdominal pain: Secondary | ICD-10-CM | POA: Diagnosis not present

## 2020-05-19 NOTE — Progress Notes (Signed)
05/19/2020 2:39 PM   James Santiago [birth year] [id number]  Referring provider: Susy Frizzle, MD [address] [place],  [place] [address]  Chief Complaint  Patient presents with  . Nephrolithiasis    HPI: James Santiago is a 70 y.o. male who presents for evaluation of stone disease.   Long history of recurrent stone disease with several prior SWL and 1 ureteroscopy  2.5-week history of bilateral intermittent back pain; described as dull with mild-moderate severity  Episode gross hematuria yesterday  + Nausea without vomiting  No fever, chills  CT 2019 showed a 6 mm distal ureteral stone and multiple bilateral renal calculi; 9 on right and 12 on left with the largest measuring 7 mm  He is on doxazosin for hypertension   PMH: Past Medical History:  Diagnosis Date  . A-fib ([place])    a. Dx 01/2012, CHADS2 = 1.-[facility] in past.  . Adenomatous colon polyp 06/2003  . Arthritis    "fingers" (02/01/2012)  . Atrial fibrillation ([place])   . Biliary dyskinesia   . Cholelithiasis    "still got 2 small stones in there" (02/01/2012)  . Esophageal stricture   . GERD (gastroesophageal reflux disease)   . Gout   . Hemorrhoids   . Hiatal hernia   . History of colon polyps   . Hypercholesteremia   . Hypertension   . Kidney stone   . Nephrolithiasis    "was told kidney function at 65%"- hx. kidney stones    Surgical History: Past Surgical History:  Procedure Laterality Date  . CARDIOVERSION  03/06/2012   Procedure: CARDIOVERSION;  Surgeon: Larey Dresser, MD;  Location: [facility] ENDOSCOPY;  Service: Cardiovascular;  Laterality: N/A;  . CHOLECYSTECTOMY N/A 11/13/2012   Procedure: LAPAROSCOPIC CHOLECYSTECTOMY;  Surgeon: Rolm Bookbinder, MD;  Location: [facility] ORS;  Service: General;  Laterality: N/A;  . COLONOSCOPY    . CYSTOSCOPY W/ URETEROSCOPY W/ LITHOTRIPSY  1980's  . CYSTOSCOPY WITH RETROGRADE PYELOGRAM, URETEROSCOPY AND STENT PLACEMENT  1980's  . ESOPHAGEAL  DILATION    . INGUINAL HERNIA REPAIR  ? 2008   "right" (02/01/2012)  . POLYPECTOMY    . VASECTOMY      Home Medications:  Allergies as of 05/19/2020      Reactions   Augmentin [amoxicillin-pot Clavulanate]    Diarrhea and sick      Medication List       Accurate as of May 19, 2020  2:39 PM. If you have any questions, ask your nurse or doctor.        STOP taking these medications   MUCINEX DM PO Stopped by: Abbie Sons, MD   Pfizer-BioNT COVID-19 Vac-TriS Susp injection Generic drug: COVID-19 mRNA Vac-TriS Therapist, music) Stopped by: Abbie Sons, MD     TAKE these medications   allopurinol 300 MG tablet Commonly known as: ZYLOPRIM TAKE 1 TABLET BY MOUTH  DAILY   amLODipine 2.5 MG tablet Commonly known as: NORVASC TAKE 1 TABLET BY MOUTH EVERY DAY What changed:   when to take this  reasons to take this   atenolol 50 MG tablet Commonly known as: TENORMIN TAKE 1 AND 1/2 TABLETS BY MOUTH DAILY   Dexilant 60 MG capsule Generic drug: dexlansoprazole TAKE 1 CAPSULE BY MOUTH  DAILY   doxazosin 4 MG tablet Commonly known as: CARDURA TAKE 1 TABLET BY MOUTH  DAILY   Eliquis 5 MG Tabs tablet Generic drug: apixaban TAKE 1 TABLET BY MOUTH  TWICE DAILY   simvastatin  10 MG tablet Commonly known as: ZOCOR TAKE 1 TABLET BY MOUTH AT  BEDTIME   telmisartan 80 MG tablet Commonly known as: MICARDIS TAKE 1 TABLET BY MOUTH IN  THE MORNING       Allergies:  Allergies  Allergen Reactions  . Augmentin [Amoxicillin-Pot Clavulanate]     Diarrhea and sick    Family History: Family History  Problem Relation Age of Onset  . Lung cancer Father        died @ 51.  Also had PPM  . Heart disease Father   . Kidney disease Mother        died @ 34. Also had h/o CVA, breat cancer, diabetes, and PPM  . Breast cancer Mother   . Diabetes Mother   . Heart disease Mother   . Cancer Brother        esophagus  . Esophageal cancer Brother   . Colon cancer Neg Hx   . Rectal  cancer Neg Hx   . Stomach cancer Neg Hx     Social History:  reports that he has quit smoking. His smoking use included cigarettes. He has a 6.00 pack-year smoking history. His smokeless tobacco use includes chew. He reports previous alcohol use. He reports that he does not use drugs.   Physical Exam: BP (!) 147/96   Pulse 91   Ht 6\' 2"  (1.88 m)   Wt 243 lb (110.2 kg)   BMI 31.20 kg/m   Constitutional:  Alert and oriented, No acute distress. HEENT: [place] AT, moist mucus membranes.  Trachea midline, no masses. Cardiovascular: No clubbing, cyanosis, or edema. Respiratory: Normal respiratory effort, no increased work of breathing. Neurologic: Grossly intact, no focal deficits, moving all 4 extremities. Psychiatric: Normal mood and affect.  Laboratory Data:  Urinalysis Dipstick 3+ blood/trace protein Microscopy >30 RBC  Pertinent Imaging: CT images personally reviewed and interpreted  CT RENAL STONE STUDY  Narrative CLINICAL DATA:  Flank pain with stone disease suspected.  EXAM: CT ABDOMEN AND PELVIS WITHOUT CONTRAST  TECHNIQUE: Multidetector CT imaging of the abdomen and pelvis was performed following the standard protocol without IV contrast.  COMPARISON:  Abdominal CT 10/03/2004  FINDINGS: Lower chest: Hiatal hernia with intrathoracic stomach. Coronary atherosclerosis.  Hepatobiliary: No focal liver abnormality.Cholecystectomy. Normal common bile duct diameter.  Pancreas: Unremarkable.  Spleen: Unremarkable.  Adrenals/Urinary Tract: Negative adrenals. 6 x 7 mm stone in the distal left ureter, 3 cm proximal to the UVJ. No hydronephrosis. There are multiple bilateral renal calculi, at least 9 on the right and 12 on the left. Bilateral stone burden has increased from prior. The largest bilateral calculi measures 7 mm. Partially distended urinary bladder which may account for mild generalized thick-walled appearance  Stomach/Bowel: Hiatal hernia as noted above.  Colonic diverticulosis. Negative for inflammation.  Vascular/Lymphatic: Mild atherosclerotic calcification of the aorta and iliacs. No mass or adenopathy.  Reproductive:Mild prostatic calcification.  Other: No ascites or pneumoperitoneum. 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 Negative for collapse. Lumbar spondylosis.  IMPRESSION: 1. 6 x 7 mm left ureteral stone located 3 cm above the UVJ. No hydronephrosis. 2. Numerous bilateral renal calculi that have increased since 2006. 3. Hiatal hernia with intrathoracic stomach. 4.  Aortic Atherosclerosis (ICD10-I70.0).  Coronary atherosclerosis. 5. AVN of both femoral heads.   Electronically Signed By: Monte Fantasia M.D. On: 04/24/2017 16:36   Assessment & Plan:    1.  Bilateral nephrolithiasis  Recent onset  bilateral back pain  Prior CT 2019 with multiple bilateral renal calculi  KUB ordered today and if no obvious ureteral calculus will schedule CT  He is on alpha-blocker and Rx oxycodone sent to [place], [place] [address], [place], [place] [address] [phone]

## 2020-05-20 ENCOUNTER — Encounter: Payer: Self-pay | Admitting: Urology

## 2020-05-20 ENCOUNTER — Other Ambulatory Visit: Payer: Medicare Other

## 2020-05-20 ENCOUNTER — Telehealth: Payer: Self-pay | Admitting: Urology

## 2020-05-20 DIAGNOSIS — N200 Calculus of kidney: Secondary | ICD-10-CM | POA: Insufficient documentation

## 2020-05-20 DIAGNOSIS — I10 Essential (primary) hypertension: Secondary | ICD-10-CM

## 2020-05-20 DIAGNOSIS — E785 Hyperlipidemia, unspecified: Secondary | ICD-10-CM

## 2020-05-20 DIAGNOSIS — Z1159 Encounter for screening for other viral diseases: Secondary | ICD-10-CM

## 2020-05-20 DIAGNOSIS — Z125 Encounter for screening for malignant neoplasm of prostate: Secondary | ICD-10-CM

## 2020-05-20 LAB — URINALYSIS, COMPLETE
Bilirubin, UA: NEGATIVE
Glucose, UA: NEGATIVE
Ketones, UA: NEGATIVE
Leukocytes,UA: NEGATIVE
Nitrite, UA: NEGATIVE
Specific Gravity, UA: 1.025 (ref 1.005–1.030)
Urobilinogen, Ur: 0.2 mg/dL (ref 0.2–1.0)
pH, UA: 6 (ref 5.0–7.5)

## 2020-05-20 LAB — MICROSCOPIC EXAMINATION
Bacteria, UA: NONE SEEN
RBC, Urine: >30 /hpf — AB (ref 0–2)
WBC, UA: NONE SEEN /hpf (ref 0–5)

## 2020-05-20 MED ORDER — OXYCODONE-ACETAMINOPHEN 5-325 MG PO TABS: 1.0000 | ORAL_TABLET | Freq: Four times a day (QID) | ORAL | 0 refills | Status: DC | PRN

## 2020-05-20 NOTE — Telephone Encounter (Signed)
KUB with bilateral renal calculi.  There may be a stone in the left ureter but not consistent on all views.  Recommend stone protocol CT.  Order was entered.  Pain medication has been sent to pharmacy

## 2020-05-20 NOTE — Telephone Encounter (Signed)
Patient notified and voiced understanding.

## 2020-05-23 LAB — LIPID PANEL
Cholesterol: 101 mg/dL (ref <200)
HDL: 39 mg/dL — ABNORMAL LOW (ref >=40)
LDL Cholesterol (Calc): 48 mg/dL (calc)
Non-HDL Cholesterol (Calc): 62 mg/dL (calc) (ref <130)
Total CHOL/HDL Ratio: 2.6 (calc) (ref <5.0)
Triglycerides: 56 mg/dL (ref <150)

## 2020-05-23 LAB — COMPLETE METABOLIC PANEL WITH GFR
AG Ratio: 1.9 (calc) (ref 1.0–2.5)
ALT: 11 U/L (ref 9–46)
AST: 13 U/L (ref 10–35)
Albumin: 4.2 g/dL (ref 3.6–5.1)
Alkaline phosphatase (APISO): 115 U/L (ref 35–144)
BUN/Creatinine Ratio: 8 (calc) (ref 6–22)
BUN: 11 mg/dL (ref 7–25)
CO2: 24 mmol/L (ref 20–32)
Calcium: 9.2 mg/dL (ref 8.6–10.3)
Chloride: 105 mmol/L (ref 98–110)
Creat: 1.43 mg/dL — ABNORMAL HIGH (ref 0.70–1.25)
GFR, Est African American: 58 mL/min/{1.73_m2} — ABNORMAL LOW (ref >=60)
GFR, Est Non African American: 50 mL/min/{1.73_m2} — ABNORMAL LOW (ref >=60)
Globulin: 2.2 g/dL (calc) (ref 1.9–3.7)
Glucose, Bld: 97 mg/dL (ref 65–99)
Potassium: 4.1 mmol/L (ref 3.5–5.3)
Sodium: 140 mmol/L (ref 135–146)
Total Bilirubin: 1.8 mg/dL — ABNORMAL HIGH (ref 0.2–1.2)
Total Protein: 6.4 g/dL (ref 6.1–8.1)

## 2020-05-23 LAB — HEPATITIS C ANTIBODY
Hepatitis C Ab: NONREACTIVE
SIGNAL TO CUT-OFF: 0.01 (ref <1.00)

## 2020-05-23 LAB — CBC WITH DIFFERENTIAL/PLATELET
Absolute Monocytes: 394 cells/uL (ref 200–950)
Basophils Absolute: 39 cells/uL (ref 0–200)
Basophils Relative: 1 %
Eosinophils Absolute: 172 cells/uL (ref 15–500)
Eosinophils Relative: 4.4 %
HCT: 46.2 % (ref 38.5–50.0)
Hemoglobin: 15.5 g/dL (ref 13.2–17.1)
Lymphs Abs: 1170 cells/uL (ref 850–3900)
MCH: 30.6 pg (ref 27.0–33.0)
MCHC: 33.5 g/dL (ref 32.0–36.0)
MCV: 91.3 fL (ref 80.0–100.0)
MPV: 11.6 fL (ref 7.5–12.5)
Monocytes Relative: 10.1 %
Neutro Abs: 2126 cells/uL (ref 1500–7800)
Neutrophils Relative %: 54.5 %
Platelets: 147 10*3/uL (ref 140–400)
RBC: 5.06 10*6/uL (ref 4.20–5.80)
RDW: 14 % (ref 11.0–15.0)
Total Lymphocyte: 30 %
WBC: 3.9 10*3/uL (ref 3.8–10.8)

## 2020-05-23 LAB — PSA: PSA: 0.36 ng/mL (ref <=4.0)

## 2020-05-26 ENCOUNTER — Other Ambulatory Visit: Payer: Self-pay

## 2020-05-26 ENCOUNTER — Encounter: Payer: Self-pay | Admitting: Family Medicine

## 2020-05-26 ENCOUNTER — Ambulatory Visit (INDEPENDENT_AMBULATORY_CARE_PROVIDER_SITE_OTHER): Payer: Medicare Other | Admitting: Family Medicine

## 2020-05-26 VITALS — BP 142/68 | HR 96 | Temp 98.1°F | Resp 16 | Ht 74.0 in | Wt 240.0 lb

## 2020-05-26 DIAGNOSIS — Z23 Encounter for immunization: Secondary | ICD-10-CM | POA: Diagnosis not present

## 2020-05-26 DIAGNOSIS — Z8739 Personal history of other diseases of the musculoskeletal system and connective tissue: Secondary | ICD-10-CM | POA: Diagnosis not present

## 2020-05-26 DIAGNOSIS — Z0000 Encounter for general adult medical examination without abnormal findings: Secondary | ICD-10-CM

## 2020-05-26 DIAGNOSIS — Z0001 Encounter for general adult medical examination with abnormal findings: Secondary | ICD-10-CM | POA: Diagnosis not present

## 2020-05-26 DIAGNOSIS — I480 Paroxysmal atrial fibrillation: Secondary | ICD-10-CM

## 2020-05-26 DIAGNOSIS — N183 Chronic kidney disease, stage 3 unspecified: Secondary | ICD-10-CM

## 2020-05-26 DIAGNOSIS — I10 Essential (primary) hypertension: Secondary | ICD-10-CM

## 2020-05-26 DIAGNOSIS — Z720 Tobacco use: Secondary | ICD-10-CM

## 2020-05-26 NOTE — Progress Notes (Signed)
Subjective:    Patient ID: James Santiago, male    DOB: 06/30/1950, 70 y.o.   MRN: [id number]  HPI  Patient is a very pleasant 70 year old Caucasian male here today for complete physical exam.  Past medical history is significant for chronic kidney disease stage III as well as history of gout.  He is currently suffering with kidney stones.  His colonoscopy is up-to-date.  He had a colonoscopy in November 2021.  Next colonoscopy is due in 5 years due to history of colon polyps.  His blood pressure today is elevated however he attributes this to kidney stones.  He has been dealing with discomfort off and on over the last few days and has not been resting well.  His PSA is excellent and shows no evidence of prostate cancer.  He denies any falls, depression, or memory loss.  His most recent immunizations are listed below Immunization History  Administered Date(s) Administered  . Influenza,inj,Quad PF,6+ Mos 03/07/2015  . PFIZER Comirnaty(Gray Top)Covid-19 Tri-Sucrose Vaccine 04/12/2020  . PFIZER(Purple Top)SARS-COV-2 Vaccination 06/24/2019, 07/21/2019  . Tdap 02/15/2011  . Zoster 09/15/2013    Lab on 05/20/2020  Component Date Value Ref Range Status  . WBC 05/20/2020 3.9  3.8 - 10.8 Thousand/uL Final  . RBC 05/20/2020 5.06  4.20 - 5.80 Million/uL Final  . Hemoglobin 05/20/2020 15.5  13.2 - 17.1 g/dL Final  . HCT 05/20/2020 46.2  38.5 - 50.0 % Final  . MCV 05/20/2020 91.3  80.0 - 100.0 fL Final  . MCH 05/20/2020 30.6  27.0 - 33.0 pg Final  . MCHC 05/20/2020 33.5  32.0 - 36.0 g/dL Final  . RDW 05/20/2020 14.0  11.0 - 15.0 % Final  . Platelets 05/20/2020 147  140 - 400 Thousand/uL Final  . MPV 05/20/2020 11.6  7.5 - 12.5 fL Final  . Neutro Abs 05/20/2020 2,126  1,500 - 7,800 cells/uL Final  . Lymphs Abs 05/20/2020 1,170  850 - 3,900 cells/uL Final  . Absolute Monocytes 05/20/2020 394  200 - 950 cells/uL Final  . Eosinophils Absolute 05/20/2020 172  15 - 500 cells/uL Final  . Basophils  Absolute 05/20/2020 39  0 - 200 cells/uL Final  . Neutrophils Relative % 05/20/2020 54.5  % Final  . Total Lymphocyte 05/20/2020 30.0  % Final  . Monocytes Relative 05/20/2020 10.1  % Final  . Eosinophils Relative 05/20/2020 4.4  % Final  . Basophils Relative 05/20/2020 1.0  % Final  . Glucose, Bld 05/20/2020 97  65 - 99 mg/dL Final   Comment: .            Fasting reference interval .   . BUN 05/20/2020 11  7 - 25 mg/dL Final  . Creat 05/20/2020 1.43* 0.70 - 1.25 mg/dL Final   Comment: For patients >36 years of age, the reference limit for Creatinine is approximately 13% higher for people identified as African-American. .   . GFR, Est Non African American 05/20/2020 50* > OR = 60 mL/min/1.84m2 Final  . GFR, Est African American 05/20/2020 58* > OR = 60 mL/min/1.71m2 Final  . BUN/Creatinine Ratio 05/20/2020 8  6 - 22 (calc) Final  . Sodium 05/20/2020 140  135 - 146 mmol/L Final  . Potassium 05/20/2020 4.1  3.5 - 5.3 mmol/L Final  . Chloride 05/20/2020 105  98 - 110 mmol/L Final  . CO2 05/20/2020 24  20 - 32 mmol/L Final  . Calcium 05/20/2020 9.2  8.6 - 10.3 mg/dL Final  . Total Protein 05/20/2020 6.4  6.1 - 8.1 g/dL Final  . Albumin 05/20/2020 4.2  3.6 - 5.1 g/dL Final  . Globulin 05/20/2020 2.2  1.9 - 3.7 g/dL (calc) Final  . AG Ratio 05/20/2020 1.9  1.0 - 2.5 (calc) Final  . Total Bilirubin 05/20/2020 1.8* 0.2 - 1.2 mg/dL Final  . Alkaline phosphatase (APISO) 05/20/2020 115  35 - 144 U/L Final  . AST 05/20/2020 13  10 - 35 U/L Final  . ALT 05/20/2020 11  9 - 46 U/L Final  . Hepatitis C Ab 05/20/2020 NON-REACTIVE  NON-REACTI Final  . SIGNAL TO CUT-OFF 05/20/2020 0.01  <1.00 Final   Comment: . HCV antibody was non-reactive. There is no laboratory  evidence of HCV infection. . In most cases, no further action is required. However, if recent HCV exposure is suspected, a test for HCV RNA (test code [phone]) is suggested. . For additional information please refer  to http://education.[facility].com/faq/FAQ22v1 (This link is being provided for informational/ educational purposes only.) .   Marland Kitchen Cholesterol 05/20/2020 101  <200 mg/dL Final  . HDL 05/20/2020 39* > OR = 40 mg/dL Final  . Triglycerides 05/20/2020 56  <150 mg/dL Final  . LDL Cholesterol (Calc) 05/20/2020 48  mg/dL (calc) Final   Comment: Reference range: <100 . Desirable range <100 mg/dL for primary prevention;   <70 mg/dL for patients with CHD or diabetic patients  with > or = 2 CHD risk factors. Marland Kitchen LDL-C is now calculated using the Martin-Hopkins  calculation, which is a validated novel method providing  better accuracy than the Friedewald equation in the  estimation of LDL-C.  Cresenciano Genre et al. Annamaria Helling. 6045;409(81): 2061-2068  (http://education.[facility].com/faq/FAQ164)   . Total CHOL/HDL Ratio 05/20/2020 2.6  <5.0 (calc) Final  . Non-HDL Cholesterol (Calc) 05/20/2020 62  <130 mg/dL (calc) Final   Comment: For patients with diabetes plus 1 major ASCVD risk  factor, treating to a non-HDL-C goal of <100 mg/dL  (LDL-C of <70 mg/dL) is considered a therapeutic  option.   Marland Kitchen PSA 05/20/2020 0.36  < OR = 4.0 ng/mL Final   Comment: The total PSA value from this assay system is  standardized against the WHO standard. The test  result will be approximately 20% lower when compared  to the equimolar-standardized total PSA (Beckman  Coulter). Comparison of serial PSA results should be  interpreted with this fact in mind. . This test was performed using the Siemens  chemiluminescent method. Values obtained from  different assay methods cannot be used interchangeably. PSA levels, regardless of value, should not be interpreted as absolute evidence of the presence or absence of disease.   Office Visit on 05/19/2020  Component Date Value Ref Range Status  . Specific Gravity, UA 05/19/2020 1.025  1.005 - 1.030 Final  . pH, UA 05/19/2020 6.0  5.0 - 7.5 Final  . Color, UA  05/19/2020 Yellow  Yellow Final  . Appearance Ur 05/19/2020 Cloudy* Clear Final  . Leukocytes,UA 05/19/2020 Negative  Negative Final  . Protein,UA 05/19/2020 Trace* Negative/Trace Final  . Glucose, UA 05/19/2020 Negative  Negative Final  . Ketones, UA 05/19/2020 Negative  Negative Final  . RBC, UA 05/19/2020 3+* Negative Final  . Bilirubin, UA 05/19/2020 Negative  Negative Final  . Urobilinogen, Ur 05/19/2020 0.2  0.2 - 1.0 mg/dL Final  . Nitrite, UA 05/19/2020 Negative  Negative Final  . Microscopic Examination 05/19/2020 See below:   Final  . WBC, UA 05/19/2020 None seen  0 - 5 /hpf Final  . RBC 05/19/2020 >30*  0 - 2 /hpf Final  . Epithelial Cells (non renal) 05/19/2020 0-10  0 - 10 /hpf Final  . Mucus, UA 05/19/2020 Present  Not Estab. Final  . Bacteria, UA 05/19/2020 None seen  None seen/Few Final     Past Medical History:  Diagnosis Date  . A-fib ([place])    a. Dx 01/2012, CHADS2 = 1.-[facility] in past.  . Adenomatous colon polyp 06/2003  . Arthritis    "fingers" (02/01/2012)  . Atrial fibrillation ([place])   . Biliary dyskinesia   . Cholelithiasis    "still got 2 small stones in there" (02/01/2012)  . Esophageal stricture   . GERD (gastroesophageal reflux disease)   . Gout   . Hemorrhoids   . Hiatal hernia   . History of colon polyps   . Hypercholesteremia   . Hypertension   . Kidney stone   . Nephrolithiasis    "was told kidney function at 65%"- hx. kidney stones   Current Outpatient Medications on File Prior to Visit  Medication Sig Dispense Refill  . allopurinol (ZYLOPRIM) 300 MG tablet TAKE 1 TABLET BY MOUTH  DAILY 90 tablet 3  . amLODipine (NORVASC) 2.5 MG tablet TAKE 1 TABLET BY MOUTH EVERY DAY (Patient taking differently: Take 2.5 mg by mouth as needed.) 90 tablet 1  . atenolol (TENORMIN) 50 MG tablet TAKE 1 AND 1/2 TABLETS BY MOUTH DAILY 135 tablet 3  . DEXILANT 60 MG capsule TAKE 1 CAPSULE BY MOUTH  DAILY 90 capsule 3  . doxazosin (CARDURA) 4 MG tablet TAKE 1  TABLET BY MOUTH  DAILY 90 tablet 3  . ELIQUIS 5 MG TABS tablet TAKE 1 TABLET BY MOUTH  TWICE DAILY 180 tablet 3  . oxyCODONE-acetaminophen (PERCOCET/ROXICET) 5-325 MG tablet Take 1 tablet by mouth every 6 (six) hours as needed for severe pain. 10 tablet 0  . simvastatin (ZOCOR) 10 MG tablet TAKE 1 TABLET BY MOUTH AT  BEDTIME 90 tablet 3  . telmisartan (MICARDIS) 80 MG tablet TAKE 1 TABLET BY MOUTH IN  THE MORNING 90 tablet 3   No current facility-administered medications on file prior to visit.   Allergies  Allergen Reactions  . Amoxicillin Diarrhea  . Augmentin [Amoxicillin-Pot Clavulanate]     Diarrhea and sick   Social History   Socioeconomic History  . Marital status: Married    Spouse name: Not on file  . Number of children: 1  . Years of education: Not on file  . Highest education level: Not on file  Occupational History    Employer: UNEMPLOYED  Tobacco Use  . Smoking status: Former Smoker    Packs/day: 0.50    Years: 12.00    Pack years: 6.00    Types: Cigarettes  . Smokeless tobacco: Current User    Types: Chew  . Tobacco comment: 02/01/2012 smoked cigarettes "probably 30 yr ago."  Currently uses 1/2 pouch of chewing tobacco daily; offered cessation materials; pt declines.  Vaping Use  . Vaping Use: Never used  Substance and Sexual Activity  . Alcohol use: Not Currently    Alcohol/week: 0.0 standard drinks    Comment: occ. beer  . Drug use: No  . Sexual activity: Not Currently  Other Topics Concern  . Not on file  Social History Narrative   Lives in [place] with wife.  They have one grown child and a grandson.  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   Social Determinants of Radio broadcast assistant  Strain: Not on file  Food Insecurity: Not on file  Transportation Needs: Not on file  Physical Activity: Not on file  Stress: Not on file  Social Connections: Not on file  Intimate Partner Violence: Not on file   Family History   Problem Relation Age of Onset  . Lung cancer Father        died @ 27.  Also had PPM  . Heart disease Father   . Kidney disease Mother        died @ 64. Also had h/o CVA, breat cancer, diabetes, and PPM  . Breast cancer Mother   . Diabetes Mother   . Heart disease Mother   . Cancer Brother        esophagus  . Esophageal cancer Brother   . Colon cancer Neg Hx   . Rectal cancer Neg Hx   . Stomach cancer Neg Hx       Review of Systems  All other systems reviewed and are negative.      Objective:   Physical Exam Vitals reviewed.  Constitutional:      General: He is not in acute distress.    Appearance: He is well-developed. He is not diaphoretic.  HENT:     Head: Normocephalic and atraumatic.     Right Ear: External ear normal.     Left Ear: External ear normal.     Nose: Nose normal.     Mouth/Throat:     Pharynx: No oropharyngeal exudate.  Eyes:     General: No scleral icterus.       Right eye: No discharge.        Left eye: No discharge.     Conjunctiva/sclera: Conjunctivae normal.     Pupils: Pupils are equal, round, and reactive to light.  Neck:     Thyroid: No thyromegaly.     Vascular: No JVD.     Trachea: No tracheal deviation.  Cardiovascular:     Rate and Rhythm: Normal rate and regular rhythm.     Heart sounds: Normal heart sounds. No murmur heard. No friction rub. No gallop.   Pulmonary:     Effort: Pulmonary effort is normal. No respiratory distress.     Breath sounds: Normal breath sounds. No stridor. No wheezing or rales.  Chest:     Chest wall: No tenderness.  Abdominal:     General: Bowel sounds are normal. There is no distension.     Palpations: Abdomen is soft. There is no mass.     Tenderness: There is no abdominal tenderness. There is no guarding or rebound.  Musculoskeletal:        General: No tenderness. Normal range of motion.     Cervical back: Normal range of motion and neck supple.     Right lower leg: No edema.     Left lower  leg: No edema.  Lymphadenopathy:     Cervical: No cervical adenopathy.  Skin:    General: Skin is warm.     Coloration: Skin is not jaundiced or pale.     Findings: No bruising, erythema, lesion or rash.  Neurological:     Mental Status: He is alert and oriented to person, place, and time.     Cranial Nerves: No cranial nerve deficit.     Sensory: No sensory deficit.     Motor: No weakness or abnormal muscle tone.     Coordination: Coordination normal.     Gait: Gait normal.  Deep Tendon Reflexes: Reflexes are normal and symmetric. Reflexes normal.  Psychiatric:        Behavior: Behavior normal.        Thought Content: Thought content normal.        Judgment: Judgment normal.           Assessment & Plan:  History of gout - Plan: Uric acid  Benign essential HTN  Paroxysmal atrial fibrillation (HCC)  Tobacco abuse  General medical exam  Stage 3 chronic kidney disease, unspecified whether stage 3a or 3b CKD (HCC)  Blood pressure is elevated today.  Of asked the patient to check his blood pressure frequently over the next few days and report to me the values.  If consistently greater than 140/90, we will need to uptitrate his medication.  He has chronic kidney disease and I am asked him to avoid NSAIDs and ensure that he is drinking plenty of water.  Due to his history of gout I recommended checking uric acid and ensure that is less than 6 given his frequent kidney stones.  He defers this to a different date.  His PSA and his prostate cancer screening is up-to-date as is his colonoscopy.  Encouraged tobacco cessation.  He is chewing tobacco not smoking.  Cholesterol is excellent.  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  I recommended him starting aerobic exercise such as a rowing machine or stationary bike to try to lose weight but otherwise the remainder of his exam is normal

## 2020-05-26 NOTE — Addendum Note (Signed)
Addended by: Sheral Flow on: 05/26/2020 12:06 PM   Modules accepted: Orders

## 2020-06-13 ENCOUNTER — Ambulatory Visit
Admission: RE | Admit: 2020-06-13 | Discharge: 2020-06-13 | Disposition: A | Discharge status: Home / Self Care | Payer: Medicare Other | Source: Ambulatory Visit | Attending: Urology | Admitting: Urology

## 2020-06-13 ENCOUNTER — Encounter: Payer: Self-pay | Admitting: Urology

## 2020-06-13 ENCOUNTER — Other Ambulatory Visit: Payer: Self-pay

## 2020-06-13 DIAGNOSIS — R109 Unspecified abdominal pain: Secondary | ICD-10-CM | POA: Diagnosis present

## 2020-06-13 DIAGNOSIS — R310 Gross hematuria: Secondary | ICD-10-CM | POA: Insufficient documentation

## 2020-06-13 DIAGNOSIS — N200 Calculus of kidney: Secondary | ICD-10-CM | POA: Insufficient documentation

## 2020-06-16 ENCOUNTER — Encounter: Payer: Self-pay | Admitting: Family Medicine

## 2020-06-24 ENCOUNTER — Other Ambulatory Visit: Payer: Self-pay | Admitting: Urology

## 2020-06-24 DIAGNOSIS — N200 Calculus of kidney: Secondary | ICD-10-CM

## 2020-07-05 ENCOUNTER — Emergency Department: Payer: Medicare Other

## 2020-07-05 ENCOUNTER — Other Ambulatory Visit: Payer: Self-pay

## 2020-07-05 ENCOUNTER — Observation Stay
Admission: EM | Admit: 2020-07-05 | Discharge: 2020-07-06 | Disposition: A | Discharge status: Home / Self Care | Payer: Medicare Other | Attending: Internal Medicine | Admitting: Internal Medicine

## 2020-07-05 DIAGNOSIS — K219 Gastro-esophageal reflux disease without esophagitis: Secondary | ICD-10-CM | POA: Diagnosis present

## 2020-07-05 DIAGNOSIS — Z20822 Contact with and (suspected) exposure to covid-19: Secondary | ICD-10-CM | POA: Insufficient documentation

## 2020-07-05 DIAGNOSIS — I10 Essential (primary) hypertension: Secondary | ICD-10-CM | POA: Diagnosis present

## 2020-07-05 DIAGNOSIS — M109 Gout, unspecified: Secondary | ICD-10-CM | POA: Diagnosis present

## 2020-07-05 DIAGNOSIS — N1831 Chronic kidney disease, stage 3a: Secondary | ICD-10-CM | POA: Diagnosis not present

## 2020-07-05 DIAGNOSIS — Z79899 Other long term (current) drug therapy: Secondary | ICD-10-CM | POA: Diagnosis not present

## 2020-07-05 DIAGNOSIS — Z7901 Long term (current) use of anticoagulants: Secondary | ICD-10-CM | POA: Insufficient documentation

## 2020-07-05 DIAGNOSIS — N132 Hydronephrosis with renal and ureteral calculous obstruction: Principal | ICD-10-CM | POA: Diagnosis present

## 2020-07-05 DIAGNOSIS — N200 Calculus of kidney: Secondary | ICD-10-CM

## 2020-07-05 DIAGNOSIS — I482 Chronic atrial fibrillation, unspecified: Secondary | ICD-10-CM

## 2020-07-05 DIAGNOSIS — E785 Hyperlipidemia, unspecified: Secondary | ICD-10-CM | POA: Diagnosis present

## 2020-07-05 DIAGNOSIS — I129 Hypertensive chronic kidney disease with stage 1 through stage 4 chronic kidney disease, or unspecified chronic kidney disease: Secondary | ICD-10-CM | POA: Insufficient documentation

## 2020-07-05 DIAGNOSIS — I4891 Unspecified atrial fibrillation: Secondary | ICD-10-CM | POA: Diagnosis not present

## 2020-07-05 DIAGNOSIS — E872 Acidosis, unspecified: Secondary | ICD-10-CM

## 2020-07-05 DIAGNOSIS — Z87891 Personal history of nicotine dependence: Secondary | ICD-10-CM | POA: Insufficient documentation

## 2020-07-05 LAB — URINALYSIS, ROUTINE W REFLEX MICROSCOPIC
Bacteria, UA: NONE SEEN
Bilirubin Urine: NEGATIVE
Glucose, UA: NEGATIVE mg/dL
Ketones, ur: NEGATIVE mg/dL
Leukocytes,Ua: NEGATIVE
Nitrite: NEGATIVE
Protein, ur: 30 mg/dL — AB
RBC / HPF: >50 RBC/hpf — ABNORMAL HIGH (ref 0–5)
Specific Gravity, Urine: 1.014 (ref 1.005–1.030)
Squamous Epithelial / HPF: NONE SEEN (ref 0–5)
pH: 5 (ref 5.0–8.0)

## 2020-07-05 LAB — CBC WITH DIFFERENTIAL/PLATELET
Abs Immature Granulocytes: 0.03 10*3/uL (ref 0.00–0.07)
Basophils Absolute: 0 10*3/uL (ref 0.0–0.1)
Basophils Relative: 1 %
Eosinophils Absolute: 0 10*3/uL (ref 0.0–0.5)
Eosinophils Relative: 0 %
HCT: 45.7 % (ref 39.0–52.0)
Hemoglobin: 15.4 g/dL (ref 13.0–17.0)
Immature Granulocytes: 0 %
Lymphocytes Relative: 10 %
Lymphs Abs: 0.8 10*3/uL (ref 0.7–4.0)
MCH: 30.1 pg (ref 26.0–34.0)
MCHC: 33.7 g/dL (ref 30.0–36.0)
MCV: 89.4 fL (ref 80.0–100.0)
Monocytes Absolute: 0.4 10*3/uL (ref 0.1–1.0)
Monocytes Relative: 5 %
Neutro Abs: 6.9 10*3/uL (ref 1.7–7.7)
Neutrophils Relative %: 84 %
Platelets: 194 10*3/uL (ref 150–400)
RBC: 5.11 MIL/uL (ref 4.22–5.81)
RDW: 13.8 % (ref 11.5–15.5)
WBC: 8.1 10*3/uL (ref 4.0–10.5)
nRBC: 0 % (ref 0.0–0.2)

## 2020-07-05 LAB — COMPREHENSIVE METABOLIC PANEL
ALT: 12 U/L (ref 0–44)
AST: 20 U/L (ref 15–41)
Albumin: 4.4 g/dL (ref 3.5–5.0)
Alkaline Phosphatase: 103 U/L (ref 38–126)
Anion gap: 13 (ref 5–15)
BUN: 12 mg/dL (ref 8–23)
CO2: 21 mmol/L — ABNORMAL LOW (ref 22–32)
Calcium: 9.3 mg/dL (ref 8.9–10.3)
Chloride: 103 mmol/L (ref 98–111)
Creatinine, Ser: 1.64 mg/dL — ABNORMAL HIGH (ref 0.61–1.24)
GFR, Estimated: 45 mL/min — ABNORMAL LOW (ref >60)
Glucose, Bld: 141 mg/dL — ABNORMAL HIGH (ref 70–99)
Potassium: 4.4 mmol/L (ref 3.5–5.1)
Sodium: 137 mmol/L (ref 135–145)
Total Bilirubin: 2.1 mg/dL — ABNORMAL HIGH (ref 0.3–1.2)
Total Protein: 7.6 g/dL (ref 6.5–8.1)

## 2020-07-05 LAB — LACTIC ACID, PLASMA: Lactic Acid, Venous: 3.3 mmol/L (ref 0.5–1.9)

## 2020-07-05 MED ORDER — CALCIUM CARBONATE ANTACID 500 MG PO CHEW: 750.0000 mg | CHEWABLE_TABLET | Freq: Every day | ORAL | Status: DC | PRN

## 2020-07-05 MED ORDER — HYDROMORPHONE HCL 1 MG/ML IJ SOLN
0.5000 mg | INTRAMUSCULAR | Status: DC | PRN
  Administered 2020-07-06 (×3): 0.5 mg via INTRAVENOUS
  Filled 2020-07-05 (×3): qty 0.5

## 2020-07-05 MED ORDER — IRBESARTAN 150 MG PO TABS
300.0000 mg | ORAL_TABLET | Freq: Every day | ORAL | Status: DC
  Administered 2020-07-06: 300 mg via ORAL
  Filled 2020-07-05: qty 2

## 2020-07-05 MED ORDER — DOXAZOSIN MESYLATE 4 MG PO TABS
4.0000 mg | ORAL_TABLET | Freq: Every day | ORAL | Status: DC
  Administered 2020-07-06: 4 mg via ORAL
  Filled 2020-07-05: qty 1

## 2020-07-05 MED ORDER — SIMETHICONE 80 MG PO CHEW: 80.0000 mg | CHEWABLE_TABLET | Freq: Four times a day (QID) | ORAL | Status: DC | PRN

## 2020-07-05 MED ORDER — SODIUM CHLORIDE 0.9% FLUSH
3.0000 mL | Freq: Two times a day (BID) | INTRAVENOUS | Status: DC
  Administered 2020-07-06 (×2): 3 mL via INTRAVENOUS

## 2020-07-05 MED ORDER — KETOROLAC TROMETHAMINE 30 MG/ML IJ SOLN
30.0000 mg | Freq: Once | INTRAMUSCULAR | Status: AC
  Administered 2020-07-05: 30 mg via INTRAVENOUS
  Filled 2020-07-05: qty 1

## 2020-07-05 MED ORDER — SODIUM CHLORIDE 0.9 % IV SOLN: INTRAVENOUS | Status: AC

## 2020-07-05 MED ORDER — POLYETHYLENE GLYCOL 3350 17 G PO PACK: 17.0000 g | PACK | Freq: Every day | ORAL | Status: DC | PRN

## 2020-07-05 MED ORDER — OXYCODONE-ACETAMINOPHEN 5-325 MG PO TABS
1.0000 | ORAL_TABLET | Freq: Once | ORAL | Status: AC
  Administered 2020-07-05: 1 via ORAL
  Filled 2020-07-05: qty 1

## 2020-07-05 MED ORDER — ALLOPURINOL 100 MG PO TABS
300.0000 mg | ORAL_TABLET | Freq: Every day | ORAL | Status: DC
  Administered 2020-07-06: 300 mg via ORAL
  Filled 2020-07-05: qty 1
  Filled 2020-07-05: qty 3

## 2020-07-05 MED ORDER — SIMVASTATIN 10 MG PO TABS
10.0000 mg | ORAL_TABLET | Freq: Every day | ORAL | Status: DC
  Filled 2020-07-05: qty 1

## 2020-07-05 MED ORDER — MORPHINE SULFATE (PF) 4 MG/ML IV SOLN
4.0000 mg | Freq: Once | INTRAVENOUS | Status: AC
  Administered 2020-07-05: 4 mg via INTRAVENOUS
  Filled 2020-07-05: qty 1

## 2020-07-05 MED ORDER — PANTOPRAZOLE SODIUM 40 MG PO TBEC
40.0000 mg | DELAYED_RELEASE_TABLET | Freq: Every day | ORAL | Status: DC
  Administered 2020-07-06 (×2): 40 mg via ORAL
  Filled 2020-07-05 (×2): qty 1

## 2020-07-05 MED ORDER — ONDANSETRON HCL 4 MG/2ML IJ SOLN
4.0000 mg | Freq: Once | INTRAMUSCULAR | Status: AC
  Administered 2020-07-05: 4 mg via INTRAVENOUS
  Filled 2020-07-05: qty 2

## 2020-07-05 MED ORDER — ATENOLOL 50 MG PO TABS
75.0000 mg | ORAL_TABLET | Freq: Every day | ORAL | Status: DC
  Administered 2020-07-06: 75 mg via ORAL
  Filled 2020-07-05: qty 1

## 2020-07-05 NOTE — ED Triage Notes (Signed)
Pt c/o right side flank pain, has hx of kidney stones. Pt states he has an apt on the 7th of next month to have them removed because he has some that are unable to be passed. Pt denies any difficulty urinating or blood in urine,.

## 2020-07-05 NOTE — ED Notes (Signed)
Pt returned from CT and having 1 episode of emesis. MD made aware.

## 2020-07-05 NOTE — ED Notes (Signed)
Pt to CT at this time.

## 2020-07-05 NOTE — H&P (Signed)
History and Physical   James Santiago:[id number] DOB: 07-Feb-1951 DOA: 07/05/2020  PCP: Susy Frizzle, MD   Patient coming from: Home  Chief Complaint: Appointment  HPI: James Santiago is a 70 y.o. male with medical history significant of atrial fibrillation, renal stones, biliary dyskinesia, hypertension, GERD, gout, hyperlipidemia, esophageal stricture status post dilation who presents with worsening of right-sided flank pain.  Patient has a known history of renal stones these were redemonstrated on CT scan on 5/2.  He was scheduled to have a urologic procedure on 6/7 for removal of the stones.  He has however developed significant right flank pain similar to prior renal stone pain.  Pain is described as 10/10, Right flank pain radiating to the groin at times. Pain relieved when seen.  He reports some abdominal pain when straining due to pain.  Patient denies fevers, chills, chest pain, shortness of breath, constipation, diarrhea, nausea, vomiting.  ED Course: Vital signs in ED are stable.  Lab work-up showed CMP with bicarb 21, creatinine 1.64 which is slightly above baseline for not significantly for AKI, glucose 141, T bili 2.1.  CBC within normal limits.  Respiratory panel flu and COVID pending.  Urinalysis with hemoglobin and protein.  Lactic acid elevated to 3.3.  CT shows migration of prior demonstrated right renal stone which is now obstructive with mild to moderate hydronephrosis, stone measures 6 x 7 x 9 mm.  Patient received pain control with Toradol, morphine, oxycodone and dose of Zofran.  Urology was consulted in the ED with plan for procedure to be moved up to tomorrow.  Review of Systems: As per HPI otherwise all other systems reviewed and are negative.  Past Medical History:  Diagnosis Date  . A-fib ([place])    a. Dx 01/2012, CHADS2 = 1.-[place] in past.  . Adenomatous colon polyp 06/2003  . Arthritis    "fingers" (02/01/2012)  . Atrial fibrillation ([place])   .  Biliary dyskinesia   . Cholelithiasis    "still got 2 small stones in there" (02/01/2012)  . Esophageal stricture   . GERD (gastroesophageal reflux disease)   . Gout   . Hemorrhoids   . Hiatal hernia   . History of colon polyps   . Hypercholesteremia   . Hypertension   . Kidney stone   . Nephrolithiasis    "was told kidney function at 65%"- hx. kidney stones    Past Surgical History:  Procedure Laterality Date  . CARDIOVERSION  03/06/2012   Procedure: CARDIOVERSION;  Surgeon: Larey Dresser, MD;  Location: [facility] ENDOSCOPY;  Service: Cardiovascular;  Laterality: N/A;  . CHOLECYSTECTOMY N/A 11/13/2012   Procedure: LAPAROSCOPIC CHOLECYSTECTOMY;  Surgeon: Rolm Bookbinder, MD;  Location: [facility] ORS;  Service: General;  Laterality: N/A;  . COLONOSCOPY    . CYSTOSCOPY W/ URETEROSCOPY W/ LITHOTRIPSY  1980's  . CYSTOSCOPY WITH RETROGRADE PYELOGRAM, URETEROSCOPY AND STENT PLACEMENT  1980's  . ESOPHAGEAL DILATION    . INGUINAL HERNIA REPAIR  ? 2008   "right" (02/01/2012)  . POLYPECTOMY    . VASECTOMY      Social History  reports that he has quit smoking. His smoking use included cigarettes. He has a 6.00 pack-year smoking history. His smokeless tobacco use includes chew. He reports previous alcohol use. He reports that he does not use drugs.  Allergies  Allergen Reactions  . Amoxicillin Diarrhea  . Augmentin [Amoxicillin-Pot Clavulanate]     Diarrhea and sick    Family History  Problem Relation Age of Onset  .  Lung cancer Father        died @ 66.  Also had PPM  . Heart disease Father   . Kidney disease Mother        died @ 35. Also had h/o CVA, breat cancer, diabetes, and PPM  . Breast cancer Mother   . Diabetes Mother   . Heart disease Mother   . Cancer Brother        esophagus  . Esophageal cancer Brother   . Colon cancer Neg Hx   . Rectal cancer Neg Hx   . Stomach cancer Neg Hx   Reviewed on admission  Prior to Admission medications   Medication Sig Start Date End Date  Taking? Authorizing Provider  allopurinol (ZYLOPRIM) 300 MG tablet TAKE 1 TABLET BY MOUTH  DAILY Patient taking differently: Take 300 mg by mouth daily. 12/23/19   Susy Frizzle, MD  amLODipine (NORVASC) 2.5 MG tablet TAKE 1 TABLET BY MOUTH EVERY DAY Patient taking differently: Take 2.5 mg by mouth daily as needed (If Blood pressure is over 130). 10/15/19   Susy Frizzle, MD  atenolol (TENORMIN) 50 MG tablet TAKE 1 AND 1/2 TABLETS BY MOUTH DAILY Patient taking differently: Take 75 mg by mouth daily. 12/18/19   Susy Frizzle, MD  calcium carbonate (TUMS EX) 750 MG chewable tablet Chew 1 tablet by mouth daily as needed for indigestion or heartburn.    [provider]  DEXILANT 60 MG capsule TAKE 1 CAPSULE BY MOUTH  DAILY Patient taking differently: Take 60 mg by mouth daily. 03/01/20   Susy Frizzle, MD  doxazosin (CARDURA) 4 MG tablet TAKE 1 TABLET BY MOUTH  DAILY Patient taking differently: Take 4 mg by mouth daily. 10/15/19   Susy Frizzle, MD  ELIQUIS 5 MG TABS tablet TAKE 1 TABLET BY MOUTH  TWICE DAILY Patient taking differently: Take 5 mg by mouth 2 (two) times daily. 05/16/20   Susy Frizzle, MD  famotidine (PEPCID) 20 MG tablet Take 20 mg by mouth daily as needed for heartburn or indigestion.    [provider]  oxyCODONE-acetaminophen (PERCOCET/ROXICET) 5-325 MG tablet Take 1 tablet by mouth every 6 (six) hours as needed for severe pain. 05/20/20   Stoioff, Ronda Fairly, MD  simethicone (MYLICON) 409 MG chewable tablet Chew 125 mg by mouth every 6 (six) hours as needed for flatulence.    [provider]  simvastatin (ZOCOR) 10 MG tablet TAKE 1 TABLET BY MOUTH AT  BEDTIME Patient taking differently: Take 10 mg by mouth at bedtime. 12/23/19   Susy Frizzle, MD  telmisartan (MICARDIS) 80 MG tablet TAKE 1 TABLET BY MOUTH IN  THE MORNING Patient taking differently: Take 80 mg by mouth daily. 11/26/19   Susy Frizzle, MD    Physical Exam: Vitals:    07/05/20 1926 07/05/20 2305  BP: (!) 138/95 136/88  Pulse: 90 92  Resp: 20 18  Temp: 98 F (36.7 C) 98.1 F (36.7 C)  TempSrc: Oral Oral  SpO2: 96% 96%  Weight: 106.6 kg   Height: 6\' 2"  (1.88 m)    Physical Exam Constitutional:      General: He is not in acute distress.    Appearance: Normal appearance. He is obese.  HENT:     Head: Normocephalic and atraumatic.     Mouth/Throat:     Mouth: Mucous membranes are moist.     Pharynx: Oropharynx is clear.  Eyes:     Extraocular Movements: Extraocular movements  intact.     Pupils: Pupils are equal, round, and reactive to light.  Cardiovascular:     Rate and Rhythm: Normal rate and regular rhythm.     Pulses: Normal pulses.     Heart sounds: Normal heart sounds.  Pulmonary:     Effort: Pulmonary effort is normal. No respiratory distress.     Breath sounds: Normal breath sounds.  Abdominal:     General: Bowel sounds are normal. There is no distension.     Palpations: Abdomen is soft.     Tenderness: There is no abdominal tenderness.  Musculoskeletal:        General: No swelling or deformity.  Skin:    General: Skin is warm and dry.  Neurological:     General: No focal deficit present.     Mental Status: Mental status is at baseline.    Labs on Admission: I have personally reviewed following labs and imaging studies  CBC: Recent Labs  Lab 07/05/20 2156  WBC 8.1  NEUTROABS 6.9  HGB 15.4  HCT 45.7  MCV 89.4  PLT 419    Basic Metabolic Panel: Recent Labs  Lab 07/05/20 2156  NA 137  K 4.4  CL 103  CO2 21*  GLUCOSE 141*  BUN 12  CREATININE 1.64*  CALCIUM 9.3    GFR: Estimated Creatinine Clearance: 56.1 mL/min (A) (by C-G formula based on SCr of 1.64 mg/dL (H)).  Liver Function Tests: Recent Labs  Lab 07/05/20 2156  AST 20  ALT 12  ALKPHOS 103  BILITOT 2.1*  PROT 7.6  ALBUMIN 4.4    Urine analysis:    Component Value Date/Time   COLORURINE YELLOW (A) 07/05/2020 1931   APPEARANCEUR HAZY  (A) 07/05/2020 1931   APPEARANCEUR Cloudy (A) 05/19/2020 1417   LABSPEC 1.014 07/05/2020 1931   PHURINE 5.0 07/05/2020 1931   GLUCOSEU NEGATIVE 07/05/2020 1931   HGBUR LARGE (A) 07/05/2020 1931   BILIRUBINUR NEGATIVE 07/05/2020 1931   BILIRUBINUR Negative 05/19/2020 1417   [place] 07/05/2020 1931   PROTEINUR 30 (A) 07/05/2020 1931   NITRITE NEGATIVE 07/05/2020 1931   LEUKOCYTESUR NEGATIVE 07/05/2020 1931    Radiological Exams on Admission: CT Renal Stone Study  Result Date: 07/05/2020 CLINICAL DATA:  Right flank pain, nephrolithiasis EXAM: CT ABDOMEN AND PELVIS WITHOUT CONTRAST TECHNIQUE: Multidetector CT imaging of the abdomen and pelvis was performed following the standard protocol without IV contrast. COMPARISON:  06/13/2020 FINDINGS: Lower chest: Large hiatal hernia again noted with displacement of the entire stomach into the left hemithorax. The visualized right lung base is clear. The visualized heart and pericardium are unremarkable. Hepatobiliary: No focal liver abnormality is seen. Status post cholecystectomy. No biliary dilatation. Pancreas: Unremarkable Spleen: Unremarkable Adrenals/Urinary Tract: The adrenal glands are unremarkable. The kidneys are normal in size and position. There is interval development of mild to moderate right hydronephrosis and moderate right perinephric stranding secondary to an obstructing 6 mm x 7 mm x 9 mm calculus within the right ureteropelvic junction which has migrated slightly distally since prior examination. Extensive bilateral nonobstructing nephrolithiasis is otherwise stable with at least 6 additional nonobstructing right renal calculi measuring up to 5 mm and 7 additional left-sided nonobstructing calculi measuring up to 11 mm. No ureteral calculi on the left. No hydronephrosis on the left. Multiple simple cortical cysts are again seen bilaterally. The bladder is unremarkable. Stomach/Bowel: Mild sigmoid diverticulosis. The small and large  bowel are otherwise unremarkable. Appendix normal. No free intraperitoneal gas or fluid. Vascular/Lymphatic: Moderate  aortoiliac atherosclerotic calcification. No aortic aneurysm. No pathologic adenopathy within the abdomen and pelvis. Reproductive: Prostate is unremarkable. Other: No abdominal wall hernia identified.  Rectum unremarkable. Musculoskeletal: No acute bone abnormality. No lytic or blastic bone lesion. IMPRESSION: Interval migration of a right renal pelvic calculus into the right ureteropelvic junction resulting in obstruction with resultant mild to moderate right hydronephrosis and moderate perinephric stranding. Obstructing calculus measures 6 x 7 x 9 mm. Moderate superimposed bilateral nonobstructing nephrolithiasis. Large hiatal hernia with displacement of the entire stomach into the left hemithorax, unchanged. Aortic Atherosclerosis (ICD10-I70.0). Electronically Signed   By: Fidela Salisbury MD   On: 07/05/2020 22:35   EKG: Not performed in ED, will check in AM prior to procedure.  Assessment/Plan Principal Problem:   Hydronephrosis with renal calculous obstruction Active Problems:   GERD (gastroesophageal reflux disease)   Gout   Atrial fibrillation (HCC)   Essential hypertension   Hyperlipemia   Bilateral nephrolithiasis  Hydronephrosis with renal calculus obstruction > Patient with known history of renal calculi with planned elective stone nerve removal on June 7 coming in for severe right flank pain. > Found to have migration of known renal stone now obstruction with mild to moderate hydronephrosis. > Hemoglobin and protein on UA, creatinine 1.64 which is somewhat above baseline but not elevated enough for an AKI. > Noted to have lactic acid to 3.3, do not expect any kind of infectious etiology he was in significant pain on arrival and was straining quite significantly per EDP. - Monitor on continuous pulse ox - Continue with pain control with as needed Dilaudid - N.p.o. at  midnight -Trend lactic acid  Lactic acidosis > As above likely due to straining with significant pain. > Does have large hiatal hernia which he may put pressure on while straining.  Hiatal hernia does look stable on CT scan  - Continue monitor lactic acid as above.  Hypertension > Blood pressure well controlled in the ED -Continue home doxazosin, atenolol -Replace home telmisartan with formulary irbesartan -Holding home amlodipine as he only takes this as needed  Atrial fibrillation - Continue with atenolol as above -Holding home Eliquis given upcoming urologic procedure  Gout -Continue home allopurinol  Hyperlipidemia -Continue home statin  GERD -Continue PPI  DVT prophylaxis: SCDs  Code Status:   Full  Family Communication:  Wife updated by phone. Disposition Plan:   Patient is from:  Home  Anticipated DC to:  Home  Anticipated DC date:  1 to 2 days  Anticipated DC barriers: None  Consults called:  Urology consulted by EDP with plan to see the patient in the morning.   Admission status:  Observation, MedSurg with continuous pulse ox   Severity of Illness: The appropriate patient status for this patient is OBSERVATION. Observation status is judged to be reasonable and necessary in order to provide the required intensity of service to ensure the patient's safety. The patient's presenting symptoms, physical exam findings, and initial radiographic and laboratory data in the context of their medical condition is felt to place them at decreased risk for further clinical deterioration. Furthermore, it is anticipated that the patient will be medically stable for discharge from the hospital within 2 midnights of admission. The following factors support the patient status of observation.   " The patient's presenting symptoms include right flank pain. " The physical exam findings include stable physical exam. " The initial radiographic and laboratory data are CT renal stone study  showing migration of right stone since 5/2  no obstruction with mild to moderate hydronephrosis with obstructing stone measuring 6 x 7 x 9 mm, nonobstructing stones as well, and stable hiatal hernia.   Marcelyn Bruins MD [facility]  How to contact the [facility] Attending or Consulting provider [place] or covering provider during after hours [place], for this patient?   1. Check the care team in [facility] and look for a) attending/consulting [facility] provider listed and b) the [facility] team listed 2. Log into [email or web address] and use [place]'s universal password to access. If you do not have the password, please contact the hospital operator. 3. Locate the [facility] provider you are looking for under [facility] and page to a number that you can be directly reached. 4. If you still have difficulty reaching the provider, please page the [facility] (Director on Call) for the Hospitalists listed on amion for assistance.  07/05/2020, 11:39 PM

## 2020-07-06 ENCOUNTER — Encounter: Payer: Self-pay | Admitting: Internal Medicine

## 2020-07-06 ENCOUNTER — Observation Stay: Payer: Medicare Other | Admitting: Anesthesiology

## 2020-07-06 ENCOUNTER — Encounter
Admission: EM | Disposition: A | Discharge status: Home / Self Care | Payer: Self-pay | Source: Home / Self Care | Attending: Emergency Medicine

## 2020-07-06 ENCOUNTER — Observation Stay: Payer: Medicare Other

## 2020-07-06 ENCOUNTER — Other Ambulatory Visit: Payer: Self-pay

## 2020-07-06 DIAGNOSIS — N201 Calculus of ureter: Secondary | ICD-10-CM

## 2020-07-06 DIAGNOSIS — I4891 Unspecified atrial fibrillation: Secondary | ICD-10-CM | POA: Diagnosis not present

## 2020-07-06 DIAGNOSIS — E785 Hyperlipidemia, unspecified: Secondary | ICD-10-CM

## 2020-07-06 DIAGNOSIS — E872 Acidosis, unspecified: Secondary | ICD-10-CM

## 2020-07-06 DIAGNOSIS — N132 Hydronephrosis with renal and ureteral calculous obstruction: Secondary | ICD-10-CM | POA: Diagnosis not present

## 2020-07-06 DIAGNOSIS — I482 Chronic atrial fibrillation, unspecified: Secondary | ICD-10-CM | POA: Diagnosis not present

## 2020-07-06 DIAGNOSIS — I10 Essential (primary) hypertension: Secondary | ICD-10-CM

## 2020-07-06 DIAGNOSIS — N200 Calculus of kidney: Secondary | ICD-10-CM | POA: Diagnosis not present

## 2020-07-06 DIAGNOSIS — N1330 Unspecified hydronephrosis: Secondary | ICD-10-CM

## 2020-07-06 DIAGNOSIS — M1A9XX0 Chronic gout, unspecified, without tophus (tophi): Secondary | ICD-10-CM

## 2020-07-06 DIAGNOSIS — N23 Unspecified renal colic: Secondary | ICD-10-CM

## 2020-07-06 HISTORY — PX: CYSTOSCOPY/URETEROSCOPY/HOLMIUM LASER/STENT PLACEMENT: SHX6546

## 2020-07-06 LAB — CBC
HCT: 40.7 % (ref 39.0–52.0)
Hemoglobin: 14.1 g/dL (ref 13.0–17.0)
MCH: 30.7 pg (ref 26.0–34.0)
MCHC: 34.6 g/dL (ref 30.0–36.0)
MCV: 88.7 fL (ref 80.0–100.0)
Platelets: 176 10*3/uL (ref 150–400)
RBC: 4.59 MIL/uL (ref 4.22–5.81)
RDW: 13.8 % (ref 11.5–15.5)
WBC: 7.4 10*3/uL (ref 4.0–10.5)
nRBC: 0 % (ref 0.0–0.2)

## 2020-07-06 LAB — HIV ANTIBODY (ROUTINE TESTING W REFLEX): HIV Screen 4th Generation wRfx: NONREACTIVE

## 2020-07-06 LAB — COMPREHENSIVE METABOLIC PANEL
ALT: 12 U/L (ref 0–44)
AST: 20 U/L (ref 15–41)
Albumin: 3.7 g/dL (ref 3.5–5.0)
Alkaline Phosphatase: 93 U/L (ref 38–126)
Anion gap: 8 (ref 5–15)
BUN: 13 mg/dL (ref 8–23)
CO2: 25 mmol/L (ref 22–32)
Calcium: 8.7 mg/dL — ABNORMAL LOW (ref 8.9–10.3)
Chloride: 105 mmol/L (ref 98–111)
Creatinine, Ser: 1.91 mg/dL — ABNORMAL HIGH (ref 0.61–1.24)
GFR, Estimated: 38 mL/min — ABNORMAL LOW (ref >60)
Glucose, Bld: 123 mg/dL — ABNORMAL HIGH (ref 70–99)
Potassium: 4.2 mmol/L (ref 3.5–5.1)
Sodium: 138 mmol/L (ref 135–145)
Total Bilirubin: 2 mg/dL — ABNORMAL HIGH (ref 0.3–1.2)
Total Protein: 6.4 g/dL — ABNORMAL LOW (ref 6.5–8.1)

## 2020-07-06 LAB — RESP PANEL BY RT-PCR (FLU A&B, COVID) ARPGX2
Influenza A by PCR: NEGATIVE
Influenza B by PCR: NEGATIVE
SARS Coronavirus 2 by RT PCR: NEGATIVE

## 2020-07-06 LAB — LACTIC ACID, PLASMA: Lactic Acid, Venous: 2.4 mmol/L (ref 0.5–1.9)

## 2020-07-06 SURGERY — CYSTOSCOPY/URETEROSCOPY/HOLMIUM LASER/STENT PLACEMENT
Anesthesia: General | Site: Ureter | Laterality: Right

## 2020-07-06 MED ORDER — ACETAMINOPHEN 10 MG/ML IV SOLN
INTRAVENOUS | Status: DC | PRN
  Administered 2020-07-06: 1000 mg via INTRAVENOUS

## 2020-07-06 MED ORDER — AMLODIPINE BESYLATE 2.5 MG PO TABS: 2.5000 mg | ORAL_TABLET | Freq: Every day | ORAL | Status: DC | PRN

## 2020-07-06 MED ORDER — ONDANSETRON HCL 4 MG/2ML IJ SOLN
INTRAMUSCULAR | Status: AC
  Filled 2020-07-06: qty 2

## 2020-07-06 MED ORDER — ACETAMINOPHEN 10 MG/ML IV SOLN
INTRAVENOUS | Status: AC
  Filled 2020-07-06: qty 100

## 2020-07-06 MED ORDER — HYDROMORPHONE HCL 1 MG/ML IJ SOLN: 0.5000 mg | INTRAMUSCULAR | Status: DC | PRN

## 2020-07-06 MED ORDER — DEXAMETHASONE SODIUM PHOSPHATE 10 MG/ML IJ SOLN
INTRAMUSCULAR | Status: DC | PRN
  Administered 2020-07-06: 10 mg via INTRAVENOUS

## 2020-07-06 MED ORDER — OXYCODONE HCL 5 MG PO TABS: 5.0000 mg | ORAL_TABLET | ORAL | Status: DC | PRN

## 2020-07-06 MED ORDER — KETOROLAC TROMETHAMINE 30 MG/ML IJ SOLN
INTRAMUSCULAR | Status: AC
  Filled 2020-07-06: qty 1

## 2020-07-06 MED ORDER — ONDANSETRON HCL 4 MG/2ML IJ SOLN
INTRAMUSCULAR | Status: DC | PRN
  Administered 2020-07-06: 4 mg via INTRAVENOUS

## 2020-07-06 MED ORDER — ACETAMINOPHEN 10 MG/ML IV SOLN: 1000.0000 mg | Freq: Once | INTRAVENOUS | Status: DC | PRN

## 2020-07-06 MED ORDER — PROPOFOL 10 MG/ML IV BOLUS
INTRAVENOUS | Status: AC
  Filled 2020-07-06: qty 20

## 2020-07-06 MED ORDER — OXYCODONE HCL 5 MG PO TABS: 5.0000 mg | ORAL_TABLET | Freq: Once | ORAL | Status: DC | PRN

## 2020-07-06 MED ORDER — LIDOCAINE HCL (CARDIAC) PF 100 MG/5ML IV SOSY
PREFILLED_SYRINGE | INTRAVENOUS | Status: DC | PRN
  Administered 2020-07-06: 60 mg via INTRAVENOUS

## 2020-07-06 MED ORDER — DEXAMETHASONE SODIUM PHOSPHATE 10 MG/ML IJ SOLN
INTRAMUSCULAR | Status: AC
  Filled 2020-07-06: qty 1

## 2020-07-06 MED ORDER — HYDROMORPHONE HCL 1 MG/ML IJ SOLN: 0.5000 mg | Freq: Once | INTRAMUSCULAR | Status: DC

## 2020-07-06 MED ORDER — FENTANYL CITRATE (PF) 100 MCG/2ML IJ SOLN: 25.0000 ug | INTRAMUSCULAR | Status: DC | PRN

## 2020-07-06 MED ORDER — ROCURONIUM BROMIDE 100 MG/10ML IV SOLN
INTRAVENOUS | Status: DC | PRN
  Administered 2020-07-06: 20 mg via INTRAVENOUS
  Administered 2020-07-06: 50 mg via INTRAVENOUS

## 2020-07-06 MED ORDER — LACTATED RINGERS IV SOLN: Freq: Once | INTRAVENOUS | Status: AC

## 2020-07-06 MED ORDER — HYDROMORPHONE HCL 1 MG/ML IJ SOLN
INTRAMUSCULAR | Status: AC
  Administered 2020-07-06: 0.5 mg via INTRAVENOUS
  Filled 2020-07-06: qty 0.5

## 2020-07-06 MED ORDER — IOHEXOL 180 MG/ML  SOLN
INTRAMUSCULAR | Status: DC | PRN
  Administered 2020-07-06: 7 mL

## 2020-07-06 MED ORDER — FENTANYL CITRATE (PF) 100 MCG/2ML IJ SOLN
INTRAMUSCULAR | Status: AC
  Filled 2020-07-06: qty 2

## 2020-07-06 MED ORDER — CEFAZOLIN SODIUM-DEXTROSE 2-4 GM/100ML-% IV SOLN
2.0000 g | Freq: Once | INTRAVENOUS | Status: AC
  Administered 2020-07-06: 2 g via INTRAVENOUS

## 2020-07-06 MED ORDER — OXYCODONE HCL 5 MG/5ML PO SOLN: 5.0000 mg | Freq: Once | ORAL | Status: DC | PRN

## 2020-07-06 MED ORDER — SUGAMMADEX SODIUM 500 MG/5ML IV SOLN
INTRAVENOUS | Status: DC | PRN
  Administered 2020-07-06: 200 mg via INTRAVENOUS

## 2020-07-06 MED ORDER — LACTATED RINGERS IV SOLN: INTRAVENOUS | Status: DC | PRN

## 2020-07-06 MED ORDER — PROPOFOL 10 MG/ML IV BOLUS
INTRAVENOUS | Status: DC | PRN
  Administered 2020-07-06: 160 mg via INTRAVENOUS

## 2020-07-06 MED ORDER — KETOROLAC TROMETHAMINE 30 MG/ML IJ SOLN
INTRAMUSCULAR | Status: DC | PRN
  Administered 2020-07-06: 30 mg via INTRAVENOUS

## 2020-07-06 MED ORDER — ONDANSETRON HCL 4 MG/2ML IJ SOLN: 4.0000 mg | Freq: Once | INTRAMUSCULAR | Status: DC | PRN

## 2020-07-06 SURGICAL SUPPLY — 28 items
BAG DRAIN CYSTO-URO LG1000N (MISCELLANEOUS) ×2 IMPLANT
BASKET ZERO TIP 1.9FR (BASKET) IMPLANT
BRUSH SCRUB EZ 1% IODOPHOR (MISCELLANEOUS) ×2 IMPLANT
CATH URET FLEX-TIP 2 LUMEN 10F (CATHETERS) IMPLANT
CATH URETL 5X70 OPEN END (CATHETERS) ×1 IMPLANT
CNTNR SPEC 2.5X3XGRAD LEK (MISCELLANEOUS) ×1
CONT SPEC 4OZ STER OR WHT (MISCELLANEOUS) ×1
CONT SPEC 4OZ STRL OR WHT (MISCELLANEOUS) ×1
CONTAINER SPEC 2.5X3XGRAD LEK (MISCELLANEOUS) IMPLANT
DRAPE UTILITY 15X26 TOWEL STRL (DRAPES) ×2 IMPLANT
GLOVE SURG UNDER POLY LF SZ7.5 (GLOVE) ×2 IMPLANT
GOWN STRL REUS W/ TWL LRG LVL3 (GOWN DISPOSABLE) ×1 IMPLANT
GOWN STRL REUS W/ TWL XL LVL3 (GOWN DISPOSABLE) ×1 IMPLANT
GOWN STRL REUS W/TWL LRG LVL3 (GOWN DISPOSABLE) ×2
GOWN STRL REUS W/TWL XL LVL3 (GOWN DISPOSABLE) ×2
GUIDEWIRE STR DUAL SENSOR (WIRE) ×2 IMPLANT
INFUSOR MANOMETER BAG 3000ML (MISCELLANEOUS) ×2 IMPLANT
IV NS IRRIG 3000ML ARTHROMATIC (IV SOLUTION) ×2 IMPLANT
KIT TURNOVER CYSTO (KITS) ×2 IMPLANT
PACK CYSTO AR (MISCELLANEOUS) ×2 IMPLANT
SET CYSTO W/LG BORE CLAMP LF (SET/KITS/TRAYS/PACK) ×2 IMPLANT
SHEATH URETERAL 12FRX35CM (MISCELLANEOUS) IMPLANT
STENT URET 6FRX24 CONTOUR (STENTS) IMPLANT
STENT URET 6FRX26 CONTOUR (STENTS) ×1 IMPLANT
SURGILUBE 2OZ TUBE FLIPTOP (MISCELLANEOUS) ×2 IMPLANT
TRACTIP FLEXIVA PULSE ID 200 (Laser) ×2 IMPLANT
VALVE UROSEAL ADJ ENDO (VALVE) IMPLANT
WATER STERILE IRR 1000ML POUR (IV SOLUTION) ×2 IMPLANT

## 2020-07-06 NOTE — Discharge Summary (Signed)
James Santiago at [facility] NAME: James Santiago    MR#:  [id number]  DATE OF BIRTH:  [birth year]  DATE OF ADMISSION:  07/05/2020 ADMITTING PHYSICIAN: Marcelyn Bruins, MD  DATE OF DISCHARGE: 07/06/2020  PRIMARY CARE PHYSICIAN: Susy Frizzle, MD    ADMISSION DIAGNOSIS:  Hydronephrosis with renal calculous obstruction [N13.2]  DISCHARGE DIAGNOSIS:  Principal Problem:   Hydronephrosis with renal calculous obstruction Active Problems:   GERD (gastroesophageal reflux disease)   Gout   Atrial fibrillation (HCC)   Essential hypertension   Hyperlipemia   Bilateral nephrolithiasis   SECONDARY DIAGNOSIS:   Past Medical History:  Diagnosis Date  . A-fib ([place])    a. Dx 01/2012, CHADS2 = 1.-[place] in past.  . Adenomatous colon polyp 06/2003  . Arthritis    "fingers" (02/01/2012)  . Atrial fibrillation ([place])   . Biliary dyskinesia   . Cholelithiasis    "still got 2 small stones in there" (02/01/2012)  . Esophageal stricture   . GERD (gastroesophageal reflux disease)   . Gout   . Hemorrhoids   . Hiatal hernia   . History of colon polyps   . Hypercholesteremia   . Hypertension   . Kidney stone   . Nephrolithiasis    "was told kidney function at 65%"- hx. kidney stones    HOSPITAL COURSE:   1.  Right hydronephrosis with obstructing renal calculus.  Dr. Bernardo Heater took to the operating room for stent placement.  Patient was given preoperative antibiotic.  Patient was feeling well after stent placement and was interested in going home.  Follow-up with urology for definitive stone management as outpatient.  Stay hydrated. 2.  Lactic acidosis improved with IV fluid hydration 3.  Essential hypertension.  On doxazosin, atenolol and telmisartan 4.  Chronic atrial fibrillation.  Can go back on Coumadin as outpatient but likely will have to be held prior to stone procedure 5.  Chronic gout on allopurinol 6.  Hyperlipidemia unspecified continue  simvastatin. 7.  GERD on PPI 8.  Acute kidney injury on chronic kidney disease stage IIIa.  Creatinine did go up to 1.91.  With the stent placement creatinine should come down.  Recommend checking a BMP and follow-up appointment.  DISCHARGE CONDITIONS:   Satisfactory  CONSULTS OBTAINED:  Treatment Team:  Abbie Sons, MD  DRUG ALLERGIES:   Allergies  Allergen Reactions  . Amoxicillin Diarrhea  . Augmentin [Amoxicillin-Pot Clavulanate]     Diarrhea and sick    DISCHARGE MEDICATIONS:   Allergies as of 07/06/2020      Reactions   Amoxicillin Diarrhea   Augmentin [amoxicillin-pot Clavulanate]    Diarrhea and sick      Medication List    TAKE these medications   allopurinol 300 MG tablet Commonly known as: ZYLOPRIM TAKE 1 TABLET BY MOUTH  DAILY   amLODipine 2.5 MG tablet Commonly known as: NORVASC Take 1 tablet (2.5 mg total) by mouth daily as needed (If Blood pressure is over 130).   atenolol 50 MG tablet Commonly known as: TENORMIN TAKE 1 AND 1/2 TABLETS BY MOUTH DAILY   calcium carbonate 750 MG chewable tablet Commonly known as: TUMS EX Chew 1 tablet by mouth daily as needed for indigestion or heartburn.   Dexilant 60 MG capsule Generic drug: dexlansoprazole TAKE 1 CAPSULE BY MOUTH  DAILY What changed: how much to take   doxazosin 4 MG tablet Commonly known as: CARDURA TAKE 1 TABLET BY MOUTH  DAILY   Eliquis  5 MG Tabs tablet Generic drug: apixaban TAKE 1 TABLET BY MOUTH  TWICE DAILY What changed: how much to take   famotidine 20 MG tablet Commonly known as: PEPCID Take 20 mg by mouth daily as needed for heartburn or indigestion.   oxyCODONE-acetaminophen 5-325 MG tablet Commonly known as: PERCOCET/ROXICET Take 1 tablet by mouth every 6 (six) hours as needed for severe pain.   simethicone 125 MG chewable tablet Commonly known as: MYLICON Chew 254 mg by mouth every 6 (six) hours as needed for flatulence.   simvastatin 10 MG tablet Commonly  known as: ZOCOR TAKE 1 TABLET BY MOUTH AT  BEDTIME   telmisartan 80 MG tablet Commonly known as: MICARDIS TAKE 1 TABLET BY MOUTH IN  THE MORNING What changed: when to take this        DISCHARGE INSTRUCTIONS:  Satisfactory  If you experience worsening of your admission symptoms, develop shortness of breath, life threatening emergency, suicidal or homicidal thoughts you must seek medical attention immediately by calling 911 or calling your MD immediately  if symptoms less severe.  You Must read complete instructions/literature along with all the possible adverse reactions/side effects for all the Medicines you take and that have been prescribed to you. Take any new Medicines after you have completely understood and accept all the possible adverse reactions/side effects.   Please note  You were cared for by a hospitalist during your hospital stay. If you have any questions about your discharge medications or the care you received while you were in the hospital after you are discharged, you can call the unit and asked to speak with the hospitalist on call if the hospitalist that took care of you is not available. Once you are discharged, your primary care physician will handle any further medical issues. Please note that NO REFILLS for any discharge medications will be authorized once you are discharged, as it is imperative that you return to your primary care physician (or establish a relationship with a primary care physician if you do not have one) for your aftercare needs so that they can reassess your need for medications and monitor your lab values.    Today   CHIEF COMPLAINT:   Chief Complaint  Patient presents with  . Flank Pain    HISTORY OF PRESENT ILLNESS:  James Santiago  is a 70 y.o. male came in with right flank pain   VITAL SIGNS:  Blood pressure (!) 141/93, pulse 69, temperature 98.2 F (36.8 C), temperature source Oral, resp. rate 18, height 6\' 2"  (1.88 m), weight  111.5 kg, SpO2 97 %.  I/O:    Intake/Output Summary (Last 24 hours) at 07/06/2020 1654 Last data filed at 07/06/2020 1319 Gross per 24 hour  Intake 500 ml  Output 55 ml  Net 445 ml    PHYSICAL EXAMINATION:  GENERAL:  70 y.o.-year-old patient lying in the bed with no acute distress.  EYES: Pupils equal, round, reactive to light and accommodation. No scleral icterus. Extraocular muscles intact.  HEENT: Head atraumatic, normocephalic. Oropharynx and nasopharynx clear.   LUNGS: Normal breath sounds bilaterally, no wheezing, rales,rhonchi or crepitation. No use of accessory muscles of respiration.  CARDIOVASCULAR: S1, S2 normal. No murmurs, rubs, or gallops.  ABDOMEN: Soft, slight tender right flank, non-distended.  EXTREMITIES: No pedal edema.  NEUROLOGIC: Cranial nerves II through XII are intact. Muscle strength 5/5 in all extremities. Sensation intact. Gait not checked.  PSYCHIATRIC: The patient is alert and oriented x 3.  SKIN: No obvious  rash, lesion, or ulcer.   DATA REVIEW:   CBC Recent Labs  Lab 07/06/20 0436  WBC 7.4  HGB 14.1  HCT 40.7  PLT 176    Chemistries  Recent Labs  Lab 07/06/20 0436  NA 138  K 4.2  CL 105  CO2 25  GLUCOSE 123*  BUN 13  CREATININE 1.91*  CALCIUM 8.7*  AST 20  ALT 12  ALKPHOS 93  BILITOT 2.0*    Microbiology Results  Results for orders placed or performed during the hospital encounter of 07/05/20  Resp Panel by RT-PCR (Flu A&B, Covid) Nasopharyngeal Swab     Status: None   Collection Time: 07/05/20 11:11 PM   Specimen: Nasopharyngeal Swab; Nasopharyngeal(NP) swabs in vial transport medium  Result Value Ref Range Status   SARS Coronavirus 2 by RT PCR NEGATIVE NEGATIVE Final    Comment: (NOTE) SARS-CoV-2 target nucleic acids are NOT DETECTED.  The SARS-CoV-2 RNA is generally detectable in upper respiratory specimens during the acute phase of infection. The lowest concentration of SARS-CoV-2 viral copies this assay can detect  is 138 copies/mL. A negative result does not preclude SARS-Cov-2 infection and should not be used as the sole basis for treatment or other patient management decisions. A negative result may occur with  improper specimen collection/handling, submission of specimen other than nasopharyngeal swab, presence of viral mutation(s) within the areas targeted by this assay, and inadequate number of viral copies(<138 copies/mL). A negative result must be combined with clinical observations, patient history, and epidemiological information. The expected result is Negative.  Fact Sheet for Patients:  EntrepreneurPulse.com.au  Fact Sheet for Healthcare Providers:  IncredibleEmployment.be  This test is no t yet approved or cleared by the [place] FDA and  has been authorized for detection and/or diagnosis of SARS-CoV-2 by FDA under an Emergency Use Authorization (EUA). This EUA will remain  in effect (meaning this test can be used) for the duration of the COVID-19 declaration under Section 564(b)(1) of the Act, 21 U.S.C.section 360bbb-3(b)(1), unless the authorization is terminated  or revoked sooner.       Influenza A by PCR NEGATIVE NEGATIVE Final   Influenza B by PCR NEGATIVE NEGATIVE Final    Comment: (NOTE) The Xpert Xpress SARS-CoV-2/FLU/RSV plus assay is intended as an aid in the diagnosis of influenza from Nasopharyngeal swab specimens and should not be used as a sole basis for treatment. Nasal washings and aspirates are unacceptable for Xpert Xpress SARS-CoV-2/FLU/RSV testing.  Fact Sheet for Patients: EntrepreneurPulse.com.au  Fact Sheet for Healthcare Providers: IncredibleEmployment.be  This test is not yet approved or cleared by the [place] FDA and has been authorized for detection and/or diagnosis of SARS-CoV-2 by FDA under an Emergency Use Authorization (EUA). This EUA will remain in effect  (meaning this test can be used) for the duration of the COVID-19 declaration under Section 564(b)(1) of the Act, 21 U.S.C. section 360bbb-3(b)(1), unless the authorization is terminated or revoked.  Performed at [facility], [address] [place], [place] [address]     RADIOLOGY:  DG OR UROLOGY CYSTO IMAGE [facility] ONLY)  Result Date: 07/06/2020 There is no interpretation for this exam.  This order is for images obtained during a surgical procedure.  Please See "Surgeries" Tab for more information regarding the procedure.   CT Renal Stone Study  Result Date: 07/05/2020 CLINICAL DATA:  Right flank pain, nephrolithiasis EXAM: CT ABDOMEN AND PELVIS WITHOUT CONTRAST TECHNIQUE: Multidetector CT imaging of the abdomen and pelvis was performed following  the standard protocol without IV contrast. COMPARISON:  06/13/2020 FINDINGS: Lower chest: Large hiatal hernia again noted with displacement of the entire stomach into the left hemithorax. The visualized right lung base is clear. The visualized heart and pericardium are unremarkable. Hepatobiliary: No focal liver abnormality is seen. Status post cholecystectomy. No biliary dilatation. Pancreas: Unremarkable Spleen: Unremarkable Adrenals/Urinary Tract: The adrenal glands are unremarkable. The kidneys are normal in size and position. There is interval development of mild to moderate right hydronephrosis and moderate right perinephric stranding secondary to an obstructing 6 mm x 7 mm x 9 mm calculus within the right ureteropelvic junction which has migrated slightly distally since prior examination. Extensive bilateral nonobstructing nephrolithiasis is otherwise stable with at least 6 additional nonobstructing right renal calculi measuring up to 5 mm and 7 additional left-sided nonobstructing calculi measuring up to 11 mm. No ureteral calculi on the left. No hydronephrosis on the left. Multiple simple cortical cysts are again seen bilaterally. The  bladder is unremarkable. Stomach/Bowel: Mild sigmoid diverticulosis. The small and large bowel are otherwise unremarkable. Appendix normal. No free intraperitoneal gas or fluid. Vascular/Lymphatic: Moderate aortoiliac atherosclerotic calcification. No aortic aneurysm. No pathologic adenopathy within the abdomen and pelvis. Reproductive: Prostate is unremarkable. Other: No abdominal wall hernia identified.  Rectum unremarkable. Musculoskeletal: No acute bone abnormality. No lytic or blastic bone lesion. IMPRESSION: Interval migration of a right renal pelvic calculus into the right ureteropelvic junction resulting in obstruction with resultant mild to moderate right hydronephrosis and moderate perinephric stranding. Obstructing calculus measures 6 x 7 x 9 mm. Moderate superimposed bilateral nonobstructing nephrolithiasis. Large hiatal hernia with displacement of the entire stomach into the left hemithorax, unchanged. Aortic Atherosclerosis (ICD10-I70.0). Electronically Signed   By: Fidela Salisbury MD   On: 07/05/2020 22:35     Management plans discussed with the patient, family and they are in agreement.  CODE STATUS:     Code Status Orders  (From admission, onward)         Start     Ordered   07/05/20 2330  Full code  Continuous        07/05/20 2330        Code Status History    This patient has a current code status but no historical code status.   Advance Care Planning Activity      TOTAL TIME TAKING CARE OF THIS PATIENT: 35 minutes.    Loletha Grayer M.D on 07/06/2020 at 4:54 PM  Between 7am to 6pm - Pager - [phone]  After 6pm go to [email or web address] - password [email or web address]  [facility]  CC: Primary care physician; Susy Frizzle, MD

## 2020-07-06 NOTE — Interval H&P Note (Signed)
History and Physical Interval Note:  07/06/2020 11:42 AM  James Santiago  has presented today for surgery, with the diagnosis of Right Nephrolithiasis.  The various methods of treatment have been discussed with the patient and family. After consideration of risks, benefits and other options for treatment, the patient has consented to  Procedure(s): CYSTOSCOPY/URETEROSCOPY/HOLMIUM LASER/STENT PLACEMENT (Right) as a surgical intervention.  The patient's history has been reviewed, patient examined, no change in status, stable for surgery.  I have reviewed the patient's chart and labs.  Questions were answered to the patient's satisfaction.     [place]

## 2020-07-06 NOTE — ED Provider Notes (Signed)
[facility] Emergency Department Provider Note   ____________________________________________   Event Date/Time   First MD Initiated Contact with Patient 07/05/20 2258     (approximate)  I have reviewed the triage vital signs and the nursing notes.   HISTORY  Chief Complaint Flank Pain    HPI James Santiago is a 70 y.o. male with below stated past medical history who presents for right flank pain that began approximately 3 hours prior to arrival and has been worsening since onset.  Patient describes 9/10 right flank pain that radiates into his right groin and is associated with nausea and has had 1 episode of nonbloody emesis.  Patient states that he has known history of of large kidney stones that were found on ultrasound to be in the renal pelvis and was scheduled for intervention with his urologist on the seventh of next month.  Patient currently denies any vision changes, tinnitus, difficulty speaking, facial droop, sore throat, chest pain, shortness of breath, diarrhea, dysuria, or weakness/numbness/paresthesias in any extremity         Past Medical History:  Diagnosis Date  . A-fib ([place])    a. Dx 01/2012, CHADS2 = 1.-[place] in past.  . Adenomatous colon polyp 06/2003  . Arthritis    "fingers" (02/01/2012)  . Atrial fibrillation ([place])   . Biliary dyskinesia   . Cholelithiasis    "still got 2 small stones in there" (02/01/2012)  . Esophageal stricture   . GERD (gastroesophageal reflux disease)   . Gout   . Hemorrhoids   . Hiatal hernia   . History of colon polyps   . Hypercholesteremia   . Hypertension   . Kidney stone   . Nephrolithiasis    "was told kidney function at 65%"- hx. kidney stones    Patient Active Problem List   Diagnosis Date Noted  . Lactic acidosis   . Hydronephrosis with renal calculous obstruction 07/05/2020  . Bilateral nephrolithiasis 05/20/2020  . GERD (gastroesophageal reflux disease) 10/17/2018  . Gout  10/17/2018  . Atrial fibrillation, chronic ([place]) 10/17/2018  . Essential hypertension 10/17/2018  . Hyperlipemia 10/17/2018  . Biliary dyskinesia   . S/P dilatation of esophageal stricture 05/14/2012  . Tobacco abuse 02/03/2012  . Hiatal hernia 02/03/2012    Past Surgical History:  Procedure Laterality Date  . CARDIOVERSION  03/06/2012   Procedure: CARDIOVERSION;  Surgeon: Larey Dresser, MD;  Location: [facility] ENDOSCOPY;  Service: Cardiovascular;  Laterality: N/A;  . CHOLECYSTECTOMY N/A 11/13/2012   Procedure: LAPAROSCOPIC CHOLECYSTECTOMY;  Surgeon: Rolm Bookbinder, MD;  Location: [facility] ORS;  Service: General;  Laterality: N/A;  . COLONOSCOPY    . CYSTOSCOPY W/ URETEROSCOPY W/ LITHOTRIPSY  1980's  . CYSTOSCOPY WITH RETROGRADE PYELOGRAM, URETEROSCOPY AND STENT PLACEMENT  1980's  . CYSTOSCOPY/URETEROSCOPY/HOLMIUM LASER/STENT PLACEMENT Right 07/06/2020   Procedure: CYSTOSCOPY/URETEROSCOPY/STENT PLACEMENT;  Surgeon: Abbie Sons, MD;  Location: [facility] ORS;  Service: Urology;  Laterality: Right;  . ESOPHAGEAL DILATION    . INGUINAL HERNIA REPAIR  ? 2008   "right" (02/01/2012)  . POLYPECTOMY    . VASECTOMY      Prior to Admission medications   Medication Sig Start Date End Date Taking? Authorizing Provider  allopurinol (ZYLOPRIM) 300 MG tablet TAKE 1 TABLET BY MOUTH  DAILY Patient taking differently: Take 300 mg by mouth daily. 12/23/19  Yes Susy Frizzle, MD  atenolol (TENORMIN) 50 MG tablet TAKE 1 AND 1/2 TABLETS BY MOUTH DAILY Patient taking differently: Take 75 mg by mouth daily.  12/18/19  Yes PickardCammie Mcgee, MD  DEXILANT 60 MG capsule TAKE 1 CAPSULE BY MOUTH  DAILY Patient taking differently: Take 60 mg by mouth daily. 03/01/20  Yes Susy Frizzle, MD  doxazosin (CARDURA) 4 MG tablet TAKE 1 TABLET BY MOUTH  DAILY Patient taking differently: Take 4 mg by mouth daily. 10/15/19  Yes Pickard, Cammie Mcgee, MD  ELIQUIS 5 MG TABS tablet TAKE 1 TABLET BY MOUTH  TWICE DAILY Patient taking  differently: Take 5 mg by mouth 2 (two) times daily. 05/16/20  Yes Susy Frizzle, MD  famotidine (PEPCID) 20 MG tablet Take 20 mg by mouth daily as needed for heartburn or indigestion.   Yes [provider]  oxyCODONE-acetaminophen (PERCOCET/ROXICET) 5-325 MG tablet Take 1 tablet by mouth every 6 (six) hours as needed for severe pain. 05/20/20  Yes Stoioff, Ronda Fairly, MD  simvastatin (ZOCOR) 10 MG tablet TAKE 1 TABLET BY MOUTH AT  BEDTIME Patient taking differently: Take 10 mg by mouth at bedtime. 12/23/19  Yes Susy Frizzle, MD  telmisartan (MICARDIS) 80 MG tablet TAKE 1 TABLET BY MOUTH IN  THE MORNING Patient taking differently: Take 80 mg by mouth daily. 11/26/19  Yes Susy Frizzle, MD  amLODipine (NORVASC) 2.5 MG tablet Take 1 tablet (2.5 mg total) by mouth daily as needed (If Blood pressure is over 130). 07/06/20   Loletha Grayer, MD  calcium carbonate (TUMS EX) 750 MG chewable tablet Chew 1 tablet by mouth daily as needed for indigestion or heartburn.    [provider]  simethicone (MYLICON) 308 MG chewable tablet Chew 125 mg by mouth every 6 (six) hours as needed for flatulence.    [provider]    Allergies Amoxicillin and Augmentin [amoxicillin-pot clavulanate]  Family History  Problem Relation Age of Onset  . Lung cancer Father        died @ 46.  Also had PPM  . Heart disease Father   . Kidney disease Mother        died @ 49. Also had h/o CVA, breat cancer, diabetes, and PPM  . Breast cancer Mother   . Diabetes Mother   . Heart disease Mother   . Cancer Brother        esophagus  . Esophageal cancer Brother   . Colon cancer Neg Hx   . Rectal cancer Neg Hx   . Stomach cancer Neg Hx     Social History Social History   Tobacco Use  . Smoking status: Former Smoker    Packs/day: 0.50    Years: 12.00    Pack years: 6.00    Types: Cigarettes  . Smokeless tobacco: Current User    Types: Chew  . Tobacco comment: 02/01/2012 smoked  cigarettes "probably 30 yr ago."  Currently uses 1/2 pouch of chewing tobacco daily; offered cessation materials; pt declines.  Vaping Use  . Vaping Use: Never used  Substance Use Topics  . Alcohol use: Not Currently    Alcohol/week: 0.0 standard drinks    Comment: occ. beer  . Drug use: No    Review of Systems Constitutional: No fever/chills Eyes: No visual changes. ENT: No sore throat. Cardiovascular: Denies chest pain. Respiratory: Denies shortness of breath. Gastrointestinal: Endorses right flank pain, nausea, and vomiting. No diarrhea. Genitourinary: Negative for dysuria. Musculoskeletal: Negative for acute arthralgias Skin: Negative for rash. Neurological: Negative for headaches, weakness/numbness/paresthesias in any extremity Psychiatric: Negative for suicidal ideation/homicidal ideation   ____________________________________________   PHYSICAL EXAM:  VITAL  SIGNS: ED Triage Vitals  Enc Vitals Group     BP 07/05/20 1926 (!) 138/95     Pulse Rate 07/05/20 1926 90     Resp 07/05/20 1926 20     Temp 07/05/20 1926 98 F (36.7 C)     Temp Source 07/05/20 1926 Oral     SpO2 07/05/20 1926 96 %     Weight 07/05/20 1926 235 lb (106.6 kg)     Height 07/05/20 1926 6\' 2"  (1.88 m)     Head Circumference --      Peak Flow --      Pain Score 07/05/20 1930 9     Pain Loc --      Pain Edu? --      Excl. in [place]? --    Constitutional: Alert and oriented. Well appearing and in no acute distress. Eyes: Conjunctivae are normal. PERRL. Head: Atraumatic. Nose: No congestion/rhinnorhea. Mouth/Throat: Mucous membranes are moist. Neck: No stridor Cardiovascular: Grossly normal heart sounds.  Good peripheral circulation. Respiratory: Normal respiratory effort.  No retractions. Gastrointestinal: Right CVA tenderness to percussion.  Soft and nontender. No distention. Musculoskeletal: No obvious deformities Neurologic:  Normal speech and language. No gross focal neurologic deficits  are appreciated. Skin:  Skin is warm and dry. No rash noted. Psychiatric: Mood and affect are normal. Speech and behavior are normal.  ____________________________________________   LABS (all labs ordered are listed, but only abnormal results are displayed)  Labs Reviewed  URINALYSIS, ROUTINE W REFLEX MICROSCOPIC - Abnormal; Notable for the following components:      Result Value   Color, Urine YELLOW (*)    APPearance HAZY (*)    Hgb urine dipstick LARGE (*)    Protein, ur 30 (*)    RBC / HPF >50 (*)    All other components within normal limits  COMPREHENSIVE METABOLIC PANEL - Abnormal; Notable for the following components:   CO2 21 (*)    Glucose, Bld 141 (*)    Creatinine, Ser 1.64 (*)    Total Bilirubin 2.1 (*)    GFR, Estimated 45 (*)    All other components within normal limits  LACTIC ACID, PLASMA - Abnormal; Notable for the following components:   Lactic Acid, Venous 3.3 (*)    All other components within normal limits  LACTIC ACID, PLASMA - Abnormal; Notable for the following components:   Lactic Acid, Venous 2.4 (*)    All other components within normal limits  COMPREHENSIVE METABOLIC PANEL - Abnormal; Notable for the following components:   Glucose, Bld 123 (*)    Creatinine, Ser 1.91 (*)    Calcium 8.7 (*)    Total Protein 6.4 (*)    Total Bilirubin 2.0 (*)    GFR, Estimated 38 (*)    All other components within normal limits  RESP PANEL BY RT-PCR (FLU A&B, COVID) ARPGX2  URINE CULTURE  CBC WITH DIFFERENTIAL/PLATELET  HIV ANTIBODY (ROUTINE TESTING W REFLEX)  CBC   ____________________________________________  EKG  ED ECG REPORT I, Naaman Plummer, the attending physician, personally viewed and interpreted this ECG.  Date: 07/06/2020 EKG Time: 0027 Rate: 91 Rhythm: Atrial fibrillation QRS Axis: normal Intervals: normal ST/T Wave abnormalities: normal Narrative Interpretation: no evidence of acute  ischemia  ____________________________________________  RADIOLOGY  ED MD interpretation: CT without contrast of the abdomen and pelvis shows a 6 x 7 x 9 obstructing calculus in the right ureteropelvic junction  Official radiology report(s): DG OR UROLOGY CYSTO IMAGE ([place])  Result Date: 07/06/2020 There is no interpretation for this exam.  This order is for images obtained during a surgical procedure.  Please See "Surgeries" Tab for more information regarding the procedure.   CT Renal Stone Study  Result Date: 07/05/2020 CLINICAL DATA:  Right flank pain, nephrolithiasis EXAM: CT ABDOMEN AND PELVIS WITHOUT CONTRAST TECHNIQUE: Multidetector CT imaging of the abdomen and pelvis was performed following the standard protocol without IV contrast. COMPARISON:  06/13/2020 FINDINGS: Lower chest: Large hiatal hernia again noted with displacement of the entire stomach into the left hemithorax. The visualized right lung base is clear. The visualized heart and pericardium are unremarkable. Hepatobiliary: No focal liver abnormality is seen. Status post cholecystectomy. No biliary dilatation. Pancreas: Unremarkable Spleen: Unremarkable Adrenals/Urinary Tract: The adrenal glands are unremarkable. The kidneys are normal in size and position. There is interval development of mild to moderate right hydronephrosis and moderate right perinephric stranding secondary to an obstructing 6 mm x 7 mm x 9 mm calculus within the right ureteropelvic junction which has migrated slightly distally since prior examination. Extensive bilateral nonobstructing nephrolithiasis is otherwise stable with at least 6 additional nonobstructing right renal calculi measuring up to 5 mm and 7 additional left-sided nonobstructing calculi measuring up to 11 mm. No ureteral calculi on the left. No hydronephrosis on the left. Multiple simple cortical cysts are again seen bilaterally. The bladder is unremarkable. Stomach/Bowel: Mild sigmoid  diverticulosis. The small and large bowel are otherwise unremarkable. Appendix normal. No free intraperitoneal gas or fluid. Vascular/Lymphatic: Moderate aortoiliac atherosclerotic calcification. No aortic aneurysm. No pathologic adenopathy within the abdomen and pelvis. Reproductive: Prostate is unremarkable. Other: No abdominal wall hernia identified.  Rectum unremarkable. Musculoskeletal: No acute bone abnormality. No lytic or blastic bone lesion. IMPRESSION: Interval migration of a right renal pelvic calculus into the right ureteropelvic junction resulting in obstruction with resultant mild to moderate right hydronephrosis and moderate perinephric stranding. Obstructing calculus measures 6 x 7 x 9 mm. Moderate superimposed bilateral nonobstructing nephrolithiasis. Large hiatal hernia with displacement of the entire stomach into the left hemithorax, unchanged. Aortic Atherosclerosis (ICD10-I70.0). Electronically Signed   By: Fidela Salisbury MD   On: 07/05/2020 22:35    ____________________________________________   PROCEDURES  Procedure(s) performed (including Critical Care):  .1-3 Lead EKG Interpretation Performed by: Naaman Plummer, MD Authorized by: Naaman Plummer, MD     Interpretation: abnormal     ECG rate:  74   ECG rate assessment: normal     Rhythm: atrial fibrillation     Ectopy: none     Conduction: normal       ____________________________________________   INITIAL IMPRESSION / ASSESSMENT AND PLAN / ED COURSE  As part of my medical decision making, I reviewed the following data within the [place] notes reviewed and incorporated, Labs reviewed, EKG interpreted, Old chart reviewed, Radiograph reviewed and Notes from prior ED visits reviewed and incorporated        Patient presents for severe flank pain. Presentation most consistent with Renal Colic from a Non-infected Kidney Stone. Given History and Exam I have lower suspicion for atypical  appendicitis, genital torsion, acute cholecystitis, AAA, Aortic Dissection, Serious Bacterial Illness or other emergent intraabdominal pathology.  Workup: CBC, BMP, CT Abd/Pelvis noncontrast, UA, reassess Findings: Large right kidney stone Reassesment: Patient tolerating PO but pain only controlled with IV narcotic medications.  Given intractable pain and size of this kidney stone, patient will need admission to the internal medicine service  Consultation: Spoke with  Dr. Jens Som Disposition:        ____________________________________________   FINAL CLINICAL IMPRESSION(S) / ED DIAGNOSES  Final diagnoses:  Hydronephrosis with renal calculous obstruction     ED Discharge Orders         Ordered    amLODipine (NORVASC) 2.5 MG tablet  Daily PRN        07/06/20 1445           Note:  This document was prepared using Dragon voice recognition software and may include unintentional dictation errors.   Naaman Plummer, MD 07/06/20 [phone]

## 2020-07-06 NOTE — H&P (View-Only) (Signed)
Urology Consult  Requesting physician: Loletha Grayer, MD  Reason for consultation: Obstructing ureteral calculus with intractable renal colic  Chief Complaint: Flank pain  History of Present Illness: James Santiago is a 70 y.o. male with a history of recurrent stone disease.  Was seen last month with intermittent back pain and CT showed bilateral renal calculi with a 7 mm right renal pelvic calculus.  He is scheduled for right ureteroscopy with laser lithotripsy/stone removal in early June 2022 however presented to the ED yesterday evening with acute onset of severe right flank pain radiating to the right groin region.  Severity 10/10 without identifiable precipitating, aggravating or alleviating factors.  CT was repeated which showed migration of his right renal calculus to the right proximal ureter with associated moderate hydronephrosis.  He was admitted due to intractable colic however states his pain is presently under control as long as he takes medication every 3 hours.  He has been afebrile.   Past Medical History:  Diagnosis Date  . A-fib ([place])    a. Dx 01/2012, CHADS2 = 1.-[facility] in past.  . Adenomatous colon polyp 06/2003  . Arthritis    "fingers" (02/01/2012)  . Atrial fibrillation ([place])   . Biliary dyskinesia   . Cholelithiasis    "still got 2 small stones in there" (02/01/2012)  . Esophageal stricture   . GERD (gastroesophageal reflux disease)   . Gout   . Hemorrhoids   . Hiatal hernia   . History of colon polyps   . Hypercholesteremia   . Hypertension   . Kidney stone   . Nephrolithiasis    "was told kidney function at 65%"- hx. kidney stones    Past Surgical History:  Procedure Laterality Date  . CARDIOVERSION  03/06/2012   Procedure: CARDIOVERSION;  Surgeon: Larey Dresser, MD;  Location: [facility] ENDOSCOPY;  Service: Cardiovascular;  Laterality: N/A;  . CHOLECYSTECTOMY N/A 11/13/2012   Procedure: LAPAROSCOPIC CHOLECYSTECTOMY;  Surgeon: Rolm Bookbinder,  MD;  Location: [facility] ORS;  Service: General;  Laterality: N/A;  . COLONOSCOPY    . CYSTOSCOPY W/ URETEROSCOPY W/ LITHOTRIPSY  1980's  . CYSTOSCOPY WITH RETROGRADE PYELOGRAM, URETEROSCOPY AND STENT PLACEMENT  1980's  . ESOPHAGEAL DILATION    . INGUINAL HERNIA REPAIR  ? 2008   "right" (02/01/2012)  . POLYPECTOMY    . VASECTOMY      Home Medications:  Current Meds  Medication Sig  . allopurinol (ZYLOPRIM) 300 MG tablet TAKE 1 TABLET BY MOUTH  DAILY (Patient taking differently: Take 300 mg by mouth daily.)  . amLODipine (NORVASC) 2.5 MG tablet TAKE 1 TABLET BY MOUTH EVERY DAY (Patient taking differently: Take 2.5 mg by mouth daily as needed (If Blood pressure is over 130).)  . atenolol (TENORMIN) 50 MG tablet TAKE 1 AND 1/2 TABLETS BY MOUTH DAILY (Patient taking differently: Take 75 mg by mouth daily.)  . DEXILANT 60 MG capsule TAKE 1 CAPSULE BY MOUTH  DAILY (Patient taking differently: Take 60 mg by mouth daily.)  . doxazosin (CARDURA) 4 MG tablet TAKE 1 TABLET BY MOUTH  DAILY (Patient taking differently: Take 4 mg by mouth daily.)  . ELIQUIS 5 MG TABS tablet TAKE 1 TABLET BY MOUTH  TWICE DAILY (Patient taking differently: Take 5 mg by mouth 2 (two) times daily.)  . famotidine (PEPCID) 20 MG tablet Take 20 mg by mouth daily as needed for heartburn or indigestion.  Marland Kitchen oxyCODONE-acetaminophen (PERCOCET/ROXICET) 5-325 MG tablet Take 1 tablet by mouth every 6 (six) hours  as needed for severe pain.  . simvastatin (ZOCOR) 10 MG tablet TAKE 1 TABLET BY MOUTH AT  BEDTIME (Patient taking differently: Take 10 mg by mouth at bedtime.)  . telmisartan (MICARDIS) 80 MG tablet TAKE 1 TABLET BY MOUTH IN  THE MORNING (Patient taking differently: Take 80 mg by mouth daily.)    Allergies:  Allergies  Allergen Reactions  . Amoxicillin Diarrhea  . Augmentin [Amoxicillin-Pot Clavulanate]     Diarrhea and sick    Family History  Problem Relation Age of Onset  . Lung cancer Father        died @ 34.  Also had  PPM  . Heart disease Father   . Kidney disease Mother        died @ 16. Also had h/o CVA, breat cancer, diabetes, and PPM  . Breast cancer Mother   . Diabetes Mother   . Heart disease Mother   . Cancer Brother        esophagus  . Esophageal cancer Brother   . Colon cancer Neg Hx   . Rectal cancer Neg Hx   . Stomach cancer Neg Hx     Social History:  reports that he has quit smoking. His smoking use included cigarettes. He has a 6.00 pack-year smoking history. His smokeless tobacco use includes chew. He reports previous alcohol use. He reports that he does not use drugs.  ROS: A complete review of systems was performed.  All systems are negative except for pertinent findings as noted.  Physical Exam:  Vital signs in last 24 hours: Temp:  [97.6 F (36.4 C)-98.3 F (36.8 C)] 98 F (36.7 C) (05/25 1048) Pulse Rate:  [63-92] 63 (05/25 1048) Resp:  [16-20] 16 (05/25 1048) BP: (135-159)/(83-99) 151/95 (05/25 1048) SpO2:  [96 %-98 %] 98 % (05/25 1048) Weight:  [106.6 kg-111.5 kg] 111.5 kg (05/25 1048) Constitutional:  Alert and oriented, No acute distress HEENT: [place] AT, moist mucus membranes.  Trachea midline, no masses Cardiovascular: Regular rate and rhythm, no clubbing, cyanosis, or edema. Respiratory: Normal respiratory effort, lungs clear bilaterally GI: Abdomen is soft, nontender, nondistended, no abdominal masses GU: No CVA tenderness Skin: No rashes, bruises or suspicious lesions Lymph: No cervical or inguinal adenopathy Neurologic: Grossly intact, no focal deficits, moving all 4 extremities Psychiatric: Normal mood and affect   Laboratory Data:  Recent Labs    07/05/20 2156 07/06/20 0436  WBC 8.1 7.4  HGB 15.4 14.1  HCT 45.7 40.7   Recent Labs    07/05/20 2156 07/06/20 0436  NA 137 138  K 4.4 4.2  CL 103 105  CO2 21* 25  GLUCOSE 141* 123*  BUN 12 13  CREATININE 1.64* 1.91*  CALCIUM 9.3 8.7*   No results for input(s): LABPT, INR in the last 72  hours. No results for input(s): LABURIN in the last 72 hours. Results for orders placed or performed during the hospital encounter of 07/05/20  Resp Panel by RT-PCR (Flu A&B, Covid) Nasopharyngeal Swab     Status: None   Collection Time: 07/05/20 11:11 PM   Specimen: Nasopharyngeal Swab; Nasopharyngeal(NP) swabs in vial transport medium  Result Value Ref Range Status   SARS Coronavirus 2 by RT PCR NEGATIVE NEGATIVE Final    Comment: (NOTE) SARS-CoV-2 target nucleic acids are NOT DETECTED.  The SARS-CoV-2 RNA is generally detectable in upper respiratory specimens during the acute phase of infection. The lowest concentration of SARS-CoV-2 viral copies this assay can detect is 138 copies/mL. A negative result  does not preclude SARS-Cov-2 infection and should not be used as the sole basis for treatment or other patient management decisions. A negative result may occur with  improper specimen collection/handling, submission of specimen other than nasopharyngeal swab, presence of viral mutation(s) within the areas targeted by this assay, and inadequate number of viral copies(<138 copies/mL). A negative result must be combined with clinical observations, patient history, and epidemiological information. The expected result is Negative.  Fact Sheet for Patients:  EntrepreneurPulse.com.au  Fact Sheet for Healthcare Providers:  IncredibleEmployment.be  This test is no t yet approved or cleared by the [place] FDA and  has been authorized for detection and/or diagnosis of SARS-CoV-2 by FDA under an Emergency Use Authorization (EUA). This EUA will remain  in effect (meaning this test can be used) for the duration of the COVID-19 declaration under Section 564(b)(1) of the Act, 21 U.S.C.section 360bbb-3(b)(1), unless the authorization is terminated  or revoked sooner.       Influenza A by PCR NEGATIVE NEGATIVE Final   Influenza B by PCR NEGATIVE  NEGATIVE Final    Comment: (NOTE) The Xpert Xpress SARS-CoV-2/FLU/RSV plus assay is intended as an aid in the diagnosis of influenza from Nasopharyngeal swab specimens and should not be used as a sole basis for treatment. Nasal washings and aspirates are unacceptable for Xpert Xpress SARS-CoV-2/FLU/RSV testing.  Fact Sheet for Patients: EntrepreneurPulse.com.au  Fact Sheet for Healthcare Providers: IncredibleEmployment.be  This test is not yet approved or cleared by the [place] FDA and has been authorized for detection and/or diagnosis of SARS-CoV-2 by FDA under an Emergency Use Authorization (EUA). This EUA will remain in effect (meaning this test can be used) for the duration of the COVID-19 declaration under Section 564(b)(1) of the Act, 21 U.S.C. section 360bbb-3(b)(1), unless the authorization is terminated or revoked.  Performed at [facility], [address] [place], [place] [address]      Radiologic Imaging: CT images were personally reviewed and interpreted  DG OR UROLOGY CYSTO IMAGE ([place])  Result Date: 07/06/2020 There is no interpretation for this exam.  This order is for images obtained during a surgical procedure.  Please See "Surgeries" Tab for more information regarding the procedure.   CT Renal Stone Study  Result Date: 07/05/2020 CLINICAL DATA:  Right flank pain, nephrolithiasis EXAM: CT ABDOMEN AND PELVIS WITHOUT CONTRAST TECHNIQUE: Multidetector CT imaging of the abdomen and pelvis was performed following the standard protocol without IV contrast. COMPARISON:  06/13/2020 FINDINGS: Lower chest: Large hiatal hernia again noted with displacement of the entire stomach into the left hemithorax. The visualized right lung base is clear. The visualized heart and pericardium are unremarkable. Hepatobiliary: No focal liver abnormality is seen. Status post cholecystectomy. No biliary dilatation. Pancreas:  Unremarkable Spleen: Unremarkable Adrenals/Urinary Tract: The adrenal glands are unremarkable. The kidneys are normal in size and position. There is interval development of mild to moderate right hydronephrosis and moderate right perinephric stranding secondary to an obstructing 6 mm x 7 mm x 9 mm calculus within the right ureteropelvic junction which has migrated slightly distally since prior examination. Extensive bilateral nonobstructing nephrolithiasis is otherwise stable with at least 6 additional nonobstructing right renal calculi measuring up to 5 mm and 7 additional left-sided nonobstructing calculi measuring up to 11 mm. No ureteral calculi on the left. No hydronephrosis on the left. Multiple simple cortical cysts are again seen bilaterally. The bladder is unremarkable. Stomach/Bowel: Mild sigmoid diverticulosis. The small and large bowel are otherwise unremarkable. Appendix  normal. No free intraperitoneal gas or fluid. Vascular/Lymphatic: Moderate aortoiliac atherosclerotic calcification. No aortic aneurysm. No pathologic adenopathy within the abdomen and pelvis. Reproductive: Prostate is unremarkable. Other: No abdominal wall hernia identified.  Rectum unremarkable. Musculoskeletal: No acute bone abnormality. No lytic or blastic bone lesion. IMPRESSION: Interval migration of a right renal pelvic calculus into the right ureteropelvic junction resulting in obstruction with resultant mild to moderate right hydronephrosis and moderate perinephric stranding. Obstructing calculus measures 6 x 7 x 9 mm. Moderate superimposed bilateral nonobstructing nephrolithiasis. Large hiatal hernia with displacement of the entire stomach into the left hemithorax, unchanged. Aortic Atherosclerosis (ICD10-I70.0). Electronically Signed   By: Fidela Salisbury MD   On: 07/05/2020 22:35    Impression:   70 y.o. male admitted with severe renal colic which could not be adequately controlled for outpatient management  Plan:    Scheduled for cystoscopy with placement right ureteral stent today for pain management.  The procedure was discussed in detail including potential risks of bleeding, infection and ureteral injury.  All questions were answered and he desires to proceed   07/06/2020, 11:30 AM  John Giovanni,  MD

## 2020-07-06 NOTE — Consult Note (Signed)
Urology Consult  Requesting physician: Loletha Grayer, MD  Reason for consultation: Obstructing ureteral calculus with intractable renal colic  Chief Complaint: Flank pain  History of Present Illness: James Santiago is a 70 y.o. male with a history of recurrent stone disease.  Was seen last month with intermittent back pain and CT showed bilateral renal calculi with a 7 mm right renal pelvic calculus.  He is scheduled for right ureteroscopy with laser lithotripsy/stone removal in early June 2022 however presented to the ED yesterday evening with acute onset of severe right flank pain radiating to the right groin region.  Severity 10/10 without identifiable precipitating, aggravating or alleviating factors.  CT was repeated which showed migration of his right renal calculus to the right proximal ureter with associated moderate hydronephrosis.  He was admitted due to intractable colic however states his pain is presently under control as long as he takes medication every 3 hours.  He has been afebrile.   Past Medical History:  Diagnosis Date  . A-fib ([place])    a. Dx 01/2012, CHADS2 = 1.-[facility] in past.  . Adenomatous colon polyp 06/2003  . Arthritis    "fingers" (02/01/2012)  . Atrial fibrillation ([place])   . Biliary dyskinesia   . Cholelithiasis    "still got 2 small stones in there" (02/01/2012)  . Esophageal stricture   . GERD (gastroesophageal reflux disease)   . Gout   . Hemorrhoids   . Hiatal hernia   . History of colon polyps   . Hypercholesteremia   . Hypertension   . Kidney stone   . Nephrolithiasis    "was told kidney function at 65%"- hx. kidney stones    Past Surgical History:  Procedure Laterality Date  . CARDIOVERSION  03/06/2012   Procedure: CARDIOVERSION;  Surgeon: Larey Dresser, MD;  Location: [facility] ENDOSCOPY;  Service: Cardiovascular;  Laterality: N/A;  . CHOLECYSTECTOMY N/A 11/13/2012   Procedure: LAPAROSCOPIC CHOLECYSTECTOMY;  Surgeon: Rolm Bookbinder,  MD;  Location: [facility] ORS;  Service: General;  Laterality: N/A;  . COLONOSCOPY    . CYSTOSCOPY W/ URETEROSCOPY W/ LITHOTRIPSY  1980's  . CYSTOSCOPY WITH RETROGRADE PYELOGRAM, URETEROSCOPY AND STENT PLACEMENT  1980's  . ESOPHAGEAL DILATION    . INGUINAL HERNIA REPAIR  ? 2008   "right" (02/01/2012)  . POLYPECTOMY    . VASECTOMY      Home Medications:  Current Meds  Medication Sig  . allopurinol (ZYLOPRIM) 300 MG tablet TAKE 1 TABLET BY MOUTH  DAILY (Patient taking differently: Take 300 mg by mouth daily.)  . amLODipine (NORVASC) 2.5 MG tablet TAKE 1 TABLET BY MOUTH EVERY DAY (Patient taking differently: Take 2.5 mg by mouth daily as needed (If Blood pressure is over 130).)  . atenolol (TENORMIN) 50 MG tablet TAKE 1 AND 1/2 TABLETS BY MOUTH DAILY (Patient taking differently: Take 75 mg by mouth daily.)  . DEXILANT 60 MG capsule TAKE 1 CAPSULE BY MOUTH  DAILY (Patient taking differently: Take 60 mg by mouth daily.)  . doxazosin (CARDURA) 4 MG tablet TAKE 1 TABLET BY MOUTH  DAILY (Patient taking differently: Take 4 mg by mouth daily.)  . ELIQUIS 5 MG TABS tablet TAKE 1 TABLET BY MOUTH  TWICE DAILY (Patient taking differently: Take 5 mg by mouth 2 (two) times daily.)  . famotidine (PEPCID) 20 MG tablet Take 20 mg by mouth daily as needed for heartburn or indigestion.  Marland Kitchen oxyCODONE-acetaminophen (PERCOCET/ROXICET) 5-325 MG tablet Take 1 tablet by mouth every 6 (six) hours  as needed for severe pain.  . simvastatin (ZOCOR) 10 MG tablet TAKE 1 TABLET BY MOUTH AT  BEDTIME (Patient taking differently: Take 10 mg by mouth at bedtime.)  . telmisartan (MICARDIS) 80 MG tablet TAKE 1 TABLET BY MOUTH IN  THE MORNING (Patient taking differently: Take 80 mg by mouth daily.)    Allergies:  Allergies  Allergen Reactions  . Amoxicillin Diarrhea  . Augmentin [Amoxicillin-Pot Clavulanate]     Diarrhea and sick    Family History  Problem Relation Age of Onset  . Lung cancer Father        died @ 31.  Also had  PPM  . Heart disease Father   . Kidney disease Mother        died @ 2. Also had h/o CVA, breat cancer, diabetes, and PPM  . Breast cancer Mother   . Diabetes Mother   . Heart disease Mother   . Cancer Brother        esophagus  . Esophageal cancer Brother   . Colon cancer Neg Hx   . Rectal cancer Neg Hx   . Stomach cancer Neg Hx     Social History:  reports that he has quit smoking. His smoking use included cigarettes. He has a 6.00 pack-year smoking history. His smokeless tobacco use includes chew. He reports previous alcohol use. He reports that he does not use drugs.  ROS: A complete review of systems was performed.  All systems are negative except for pertinent findings as noted.  Physical Exam:  Vital signs in last 24 hours: Temp:  [97.6 F (36.4 C)-98.3 F (36.8 C)] 98 F (36.7 C) (05/25 1048) Pulse Rate:  [63-92] 63 (05/25 1048) Resp:  [16-20] 16 (05/25 1048) BP: (135-159)/(83-99) 151/95 (05/25 1048) SpO2:  [96 %-98 %] 98 % (05/25 1048) Weight:  [106.6 kg-111.5 kg] 111.5 kg (05/25 1048) Constitutional:  Alert and oriented, No acute distress HEENT: [place] AT, moist mucus membranes.  Trachea midline, no masses Cardiovascular: Regular rate and rhythm, no clubbing, cyanosis, or edema. Respiratory: Normal respiratory effort, lungs clear bilaterally GI: Abdomen is soft, nontender, nondistended, no abdominal masses GU: No CVA tenderness Skin: No rashes, bruises or suspicious lesions Lymph: No cervical or inguinal adenopathy Neurologic: Grossly intact, no focal deficits, moving all 4 extremities Psychiatric: Normal mood and affect   Laboratory Data:  Recent Labs    07/05/20 2156 07/06/20 0436  WBC 8.1 7.4  HGB 15.4 14.1  HCT 45.7 40.7   Recent Labs    07/05/20 2156 07/06/20 0436  NA 137 138  K 4.4 4.2  CL 103 105  CO2 21* 25  GLUCOSE 141* 123*  BUN 12 13  CREATININE 1.64* 1.91*  CALCIUM 9.3 8.7*   No results for input(s): LABPT, INR in the last 72  hours. No results for input(s): LABURIN in the last 72 hours. Results for orders placed or performed during the hospital encounter of 07/05/20  Resp Panel by RT-PCR (Flu A&B, Covid) Nasopharyngeal Swab     Status: None   Collection Time: 07/05/20 11:11 PM   Specimen: Nasopharyngeal Swab; Nasopharyngeal(NP) swabs in vial transport medium  Result Value Ref Range Status   SARS Coronavirus 2 by RT PCR NEGATIVE NEGATIVE Final    Comment: (NOTE) SARS-CoV-2 target nucleic acids are NOT DETECTED.  The SARS-CoV-2 RNA is generally detectable in upper respiratory specimens during the acute phase of infection. The lowest concentration of SARS-CoV-2 viral copies this assay can detect is 138 copies/mL. A negative result  does not preclude SARS-Cov-2 infection and should not be used as the sole basis for treatment or other patient management decisions. A negative result may occur with  improper specimen collection/handling, submission of specimen other than nasopharyngeal swab, presence of viral mutation(s) within the areas targeted by this assay, and inadequate number of viral copies(<138 copies/mL). A negative result must be combined with clinical observations, patient history, and epidemiological information. The expected result is Negative.  Fact Sheet for Patients:  EntrepreneurPulse.com.au  Fact Sheet for Healthcare Providers:  IncredibleEmployment.be  This test is no t yet approved or cleared by the [place] FDA and  has been authorized for detection and/or diagnosis of SARS-CoV-2 by FDA under an Emergency Use Authorization (EUA). This EUA will remain  in effect (meaning this test can be used) for the duration of the COVID-19 declaration under Section 564(b)(1) of the Act, 21 U.S.C.section 360bbb-3(b)(1), unless the authorization is terminated  or revoked sooner.       Influenza A by PCR NEGATIVE NEGATIVE Final   Influenza B by PCR NEGATIVE  NEGATIVE Final    Comment: (NOTE) The Xpert Xpress SARS-CoV-2/FLU/RSV plus assay is intended as an aid in the diagnosis of influenza from Nasopharyngeal swab specimens and should not be used as a sole basis for treatment. Nasal washings and aspirates are unacceptable for Xpert Xpress SARS-CoV-2/FLU/RSV testing.  Fact Sheet for Patients: EntrepreneurPulse.com.au  Fact Sheet for Healthcare Providers: IncredibleEmployment.be  This test is not yet approved or cleared by the [place] FDA and has been authorized for detection and/or diagnosis of SARS-CoV-2 by FDA under an Emergency Use Authorization (EUA). This EUA will remain in effect (meaning this test can be used) for the duration of the COVID-19 declaration under Section 564(b)(1) of the Act, 21 U.S.C. section 360bbb-3(b)(1), unless the authorization is terminated or revoked.  Performed at [facility], [address], [place], [place] [address]      Radiologic Imaging: CT images were personally reviewed and interpreted  DG OR UROLOGY CYSTO IMAGE ([place])  Result Date: 07/06/2020 There is no interpretation for this exam.  This order is for images obtained during a surgical procedure.  Please See "Surgeries" Tab for more information regarding the procedure.   CT Renal Stone Study  Result Date: 07/05/2020 CLINICAL DATA:  Right flank pain, nephrolithiasis EXAM: CT ABDOMEN AND PELVIS WITHOUT CONTRAST TECHNIQUE: Multidetector CT imaging of the abdomen and pelvis was performed following the standard protocol without IV contrast. COMPARISON:  06/13/2020 FINDINGS: Lower chest: Large hiatal hernia again noted with displacement of the entire stomach into the left hemithorax. The visualized right lung base is clear. The visualized heart and pericardium are unremarkable. Hepatobiliary: No focal liver abnormality is seen. Status post cholecystectomy. No biliary dilatation. Pancreas:  Unremarkable Spleen: Unremarkable Adrenals/Urinary Tract: The adrenal glands are unremarkable. The kidneys are normal in size and position. There is interval development of mild to moderate right hydronephrosis and moderate right perinephric stranding secondary to an obstructing 6 mm x 7 mm x 9 mm calculus within the right ureteropelvic junction which has migrated slightly distally since prior examination. Extensive bilateral nonobstructing nephrolithiasis is otherwise stable with at least 6 additional nonobstructing right renal calculi measuring up to 5 mm and 7 additional left-sided nonobstructing calculi measuring up to 11 mm. No ureteral calculi on the left. No hydronephrosis on the left. Multiple simple cortical cysts are again seen bilaterally. The bladder is unremarkable. Stomach/Bowel: Mild sigmoid diverticulosis. The small and large bowel are otherwise unremarkable. Appendix  normal. No free intraperitoneal gas or fluid. Vascular/Lymphatic: Moderate aortoiliac atherosclerotic calcification. No aortic aneurysm. No pathologic adenopathy within the abdomen and pelvis. Reproductive: Prostate is unremarkable. Other: No abdominal wall hernia identified.  Rectum unremarkable. Musculoskeletal: No acute bone abnormality. No lytic or blastic bone lesion. IMPRESSION: Interval migration of a right renal pelvic calculus into the right ureteropelvic junction resulting in obstruction with resultant mild to moderate right hydronephrosis and moderate perinephric stranding. Obstructing calculus measures 6 x 7 x 9 mm. Moderate superimposed bilateral nonobstructing nephrolithiasis. Large hiatal hernia with displacement of the entire stomach into the left hemithorax, unchanged. Aortic Atherosclerosis (ICD10-I70.0). Electronically Signed   By: Fidela Salisbury MD   On: 07/05/2020 22:35    Impression:   70 y.o. male admitted with severe renal colic which could not be adequately controlled for outpatient management  Plan:    Scheduled for cystoscopy with placement right ureteral stent today for pain management.  The procedure was discussed in detail including potential risks of bleeding, infection and ureteral injury.  All questions were answered and he desires to proceed   07/06/2020, 11:30 AM  John Giovanni,  MD

## 2020-07-06 NOTE — Anesthesia Postprocedure Evaluation (Signed)
Anesthesia Post Note  Patient: James Santiago  Procedure(s) Performed: CYSTOSCOPY/URETEROSCOPY/STENT PLACEMENT (Right Ureter)  Patient location during evaluation: PACU Anesthesia Type: General Level of consciousness: awake and alert Pain management: pain level controlled Vital Signs Assessment: post-procedure vital signs reviewed and stable Respiratory status: spontaneous breathing, nonlabored ventilation, respiratory function stable and patient connected to nasal cannula oxygen Cardiovascular status: blood pressure returned to baseline and stable Postop Assessment: no apparent nausea or vomiting Anesthetic complications: no   No complications documented.   Last Vitals:  Vitals:   07/06/20 1330 07/06/20 1359  BP: 137/87 (!) 132/96  Pulse: 73 62  Resp: 13 18  Temp: (!) 36.4 C 36.7 C  SpO2: 97% 97%    Last Pain:  Vitals:   07/06/20 1359  TempSrc: Oral  PainSc:                  [name]

## 2020-07-06 NOTE — H&P (View-Only) (Signed)
Urology Consult  Requesting physician: Loletha Grayer, MD  Reason for consultation: Obstructing ureteral calculus with intractable renal colic  Chief Complaint: Flank pain  History of Present Illness: James Santiago is a 70 y.o. male with a history of recurrent stone disease.  Was seen last month with intermittent back pain and CT showed bilateral renal calculi with a 7 mm right renal pelvic calculus.  He is scheduled for right ureteroscopy with laser lithotripsy/stone removal in early June 2022 however presented to the ED yesterday evening with acute onset of severe right flank pain radiating to the right groin region.  Severity 10/10 without identifiable precipitating, aggravating or alleviating factors.  CT was repeated which showed migration of his right renal calculus to the right proximal ureter with associated moderate hydronephrosis.  He was admitted due to intractable colic however states his pain is presently under control as long as he takes medication every 3 hours.  He has been afebrile.   Past Medical History:  Diagnosis Date  . A-fib ([place])    a. Dx 01/2012, CHADS2 = 1.-[facility] in past.  . Adenomatous colon polyp 06/2003  . Arthritis    "fingers" (02/01/2012)  . Atrial fibrillation ([place])   . Biliary dyskinesia   . Cholelithiasis    "still got 2 small stones in there" (02/01/2012)  . Esophageal stricture   . GERD (gastroesophageal reflux disease)   . Gout   . Hemorrhoids   . Hiatal hernia   . History of colon polyps   . Hypercholesteremia   . Hypertension   . Kidney stone   . Nephrolithiasis    "was told kidney function at 65%"- hx. kidney stones    Past Surgical History:  Procedure Laterality Date  . CARDIOVERSION  03/06/2012   Procedure: CARDIOVERSION;  Surgeon: Larey Dresser, MD;  [facility] END[facility]COPY;  Service: Cardiovascular;  Laterality: N/A;  . CHOLECYSTECTOMY N/A 11/13/2012   Procedure: LAPAROSCOPIC CHOLECYSTECTOMY;  Surgeon: Rolm Bookbinder,  MD;  Location: [facility] ORS;  Service: General;  Laterality: N/A;  . COLONOSCOPY    . CYSTOSCOPY W/ URETEROSCOPY W/ LITHOTRIPSY  1980's  . CYSTOSCOPY WITH RETROGRADE PYELOGRAM, URETEROSCOPY AND STENT PLACEMENT  1980's  . ESOPHAGEAL DILATION    . INGUINAL HERNIA REPAIR  ? 2008   "right" (02/01/2012)  . POLYPECTOMY    . VASECTOMY      Home Medications:  Current Meds  Medication Sig  . allopurinol (ZYLOPRIM) 300 MG tablet TAKE 1 TABLET BY MOUTH  DAILY (Patient taking differently: Take 300 mg by mouth daily.)  . amLODipine (NORVASC) 2.5 MG tablet TAKE 1 TABLET BY MOUTH EVERY DAY (Patient taking differently: Take 2.5 mg by mouth daily as needed (If Blood pressure is over 130).)  . atenolol (TENORMIN) 50 MG tablet TAKE 1 AND 1/2 TABLETS BY MOUTH DAILY (Patient taking differently: Take 75 mg by mouth daily.)  . DEXILANT 60 MG capsule TAKE 1 CAPSULE BY MOUTH  DAILY (Patient taking differently: Take 60 mg by mouth daily.)  . doxazosin (CARDURA) 4 MG tablet TAKE 1 TABLET BY MOUTH  DAILY (Patient taking differently: Take 4 mg by mouth daily.)  . ELIQUIS 5 MG TABS tablet TAKE 1 TABLET BY MOUTH  TWICE DAILY (Patient taking differently: Take 5 mg by mouth 2 (two) times daily.)  . famotidine (PEPCID) 20 MG tablet Take 20 mg by mouth daily as needed for heartburn or indigestion.  Marland Kitchen oxyCODONE-acetaminophen (PERCOCET/ROXICET) 5-325 MG tablet Take 1 tablet by mouth every 6 (six) hours  as needed for severe pain.  . simvastatin (ZOCOR) 10 MG tablet TAKE 1 TABLET BY MOUTH AT  BEDTIME (Patient taking differently: Take 10 mg by mouth at bedtime.)  . telmisartan (MICARDIS) 80 MG tablet TAKE 1 TABLET BY MOUTH IN  THE MORNING (Patient taking differently: Take 80 mg by mouth daily.)    Allergies:  Allergies  Allergen Reactions  . Amoxicillin Diarrhea  . Augmentin [Amoxicillin-Pot Clavulanate]     Diarrhea and sick    Family History  Problem Relation Age of Onset  . Lung cancer Father        died @ 54.  Also had  PPM  . Heart disease Father   . Kidney disease Mother        died @ 70. Also had h/o CVA, breat cancer, diabetes, and PPM  . Breast cancer Mother   . Diabetes Mother   . Heart disease Mother   . Cancer Brother        esophagus  . Esophageal cancer Brother   . Colon cancer Neg Hx   . Rectal cancer Neg Hx   . Stomach cancer Neg Hx     Social History:  reports that he has quit smoking. His smoking use included cigarettes. He has a 6.00 pack-year smoking history. His smokeless tobacco use includes chew. He reports previous alcohol use. He reports that he does not use drugs.  ROS: A complete review of systems was performed.  All systems are negative except for pertinent findings as noted.  Physical Exam:  Vital signs in last 24 hours: Temp:  [97.6 F (36.4 C)-98.3 F (36.8 C)] 98 F (36.7 C) (05/25 1048) Pulse Rate:  [63-92] 63 (05/25 1048) Resp:  [16-20] 16 (05/25 1048) BP: (135-159)/(83-99) 151/95 (05/25 1048) SpO2:  [96 %-98 %] 98 % (05/25 1048) Weight:  [106.6 kg-111.5 kg] 111.5 kg (05/25 1048) Constitutional:  Alert and oriented, No acute distress HEENT: [place] AT, moist mucus membranes.  Trachea midline, no masses Cardiovascular: Regular rate and rhythm, no clubbing, cyanosis, or edema. Respiratory: Normal respiratory effort, lungs clear bilaterally GI: Abdomen is soft, nontender, nondistended, no abdominal masses GU: No CVA tenderness Skin: No rashes, bruises or suspicious lesions Lymph: No cervical or inguinal adenopathy Neurologic: Grossly intact, no focal deficits, moving all 4 extremities Psychiatric: Normal mood and affect   Laboratory Data:  Recent Labs    07/05/20 2156 07/06/20 0436  WBC 8.1 7.4  HGB 15.4 14.1  HCT 45.7 40.7   Recent Labs    07/05/20 2156 07/06/20 0436  NA 137 138  K 4.4 4.2  CL 103 105  CO2 21* 25  GLUCOSE 141* 123*  BUN 12 13  CREATININE 1.64* 1.91*  CALCIUM 9.3 8.7*   No results for input(s): LABPT, INR in the last 72  hours. No results for input(s): LABURIN in the last 72 hours. Results for orders placed or performed during the hospital encounter of 07/05/20  Resp Panel by RT-PCR (Flu A&B, Covid) Nasopharyngeal Swab     Status: None   Collection Time: 07/05/20 11:11 PM   Specimen: Nasopharyngeal Swab; Nasopharyngeal(NP) swabs in vial transport medium  Result Value Ref Range Status   SARS Coronavirus 2 by RT PCR NEGATIVE NEGATIVE Final    Comment: (NOTE) SARS-CoV-2 target nucleic acids are NOT DETECTED.  The SARS-CoV-2 RNA is generally detectable in upper respiratory specimens during the acute phase of infection. The lowest concentration of SARS-CoV-2 viral copies this assay can detect is 138 copies/mL. A negative result  does not preclude SARS-Cov-2 infection and should not be used as the sole basis for treatment or other patient management decisions. A negative result may occur with  improper specimen collection/handling, submission of specimen other than nasopharyngeal swab, presence of viral mutation(s) within the areas targeted by this assay, and inadequate number of viral copies(<138 copies/mL). A negative result must be combined with clinical observations, patient history, and epidemiological information. The expected result is Negative.  Fact Sheet for Patients:  EntrepreneurPulse.com.au  Fact Sheet for Healthcare Providers:  IncredibleEmployment.be  This test is no t yet approved or cleared by the [place] FDA and  has been authorized for detection and/or diagnosis of SARS-CoV-2 by FDA under an Emergency Use Authorization (EUA). This EUA will remain  in effect (meaning this test can be used) for the duration of the COVID-19 declaration under Section 564(b)(1) of the Act, 21 U.S.C.section 360bbb-3(b)(1), unless the authorization is terminated  or revoked sooner.       Influenza A by PCR NEGATIVE NEGATIVE Final   Influenza B by PCR NEGATIVE  NEGATIVE Final    Comment: (NOTE) The Xpert Xpress SARS-CoV-2/FLU/RSV plus assay is intended as an aid in the diagnosis of influenza from Nasopharyngeal swab specimens and should not be used as a sole basis for treatment. Nasal washings and aspirates are unacceptable for Xpert Xpress SARS-CoV-2/FLU/RSV testing.  Fact Sheet for Patients: EntrepreneurPulse.com.au  Fact Sheet for Healthcare Providers: IncredibleEmployment.be  This test is not yet approved or cleared by the [place] FDA and has been authorized for detection and/or diagnosis of SARS-CoV-2 by FDA under an Emergency Use Authorization (EUA). This EUA will remain in effect (meaning this test can be used) for the duration of the COVID-19 declaration under Section 564(b)(1) of the Act, 21 U.S.C. section 360bbb-3(b)(1), unless the authorization is terminated or revoked.  Performed at [facility], [address] [place], [place] [address]      Radiologic Imaging: CT images were personally reviewed and interpreted  DG OR UROLOGY CYSTO IMAGE ([place])  Result Date: 07/06/2020 There is no interpretation for this exam.  This order is for images obtained during a surgical procedure.  Please See "Surgeries" Tab for more information regarding the procedure.   CT Renal Stone Study  Result Date: 07/05/2020 CLINICAL DATA:  Right flank pain, nephrolithiasis EXAM: CT ABDOMEN AND PELVIS WITHOUT CONTRAST TECHNIQUE: Multidetector CT imaging of the abdomen and pelvis was performed following the standard protocol without IV contrast. COMPARISON:  06/13/2020 FINDINGS: Lower chest: Large hiatal hernia again noted with displacement of the entire stomach into the left hemithorax. The visualized right lung base is clear. The visualized heart and pericardium are unremarkable. Hepatobiliary: No focal liver abnormality is seen. Status post cholecystectomy. No biliary dilatation. Pancreas:  Unremarkable Spleen: Unremarkable Adrenals/Urinary Tract: The adrenal glands are unremarkable. The kidneys are normal in size and position. There is interval development of mild to moderate right hydronephrosis and moderate right perinephric stranding secondary to an obstructing 6 mm x 7 mm x 9 mm calculus within the right ureteropelvic junction which has migrated slightly distally since prior examination. Extensive bilateral nonobstructing nephrolithiasis is otherwise stable with at least 6 additional nonobstructing right renal calculi measuring up to 5 mm and 7 additional left-sided nonobstructing calculi measuring up to 11 mm. No ureteral calculi on the left. No hydronephrosis on the left. Multiple simple cortical cysts are again seen bilaterally. The bladder is unremarkable. Stomach/Bowel: Mild sigmoid diverticulosis. The small and large bowel are otherwise unremarkable. Appendix  normal. No free intraperitoneal gas or fluid. Vascular/Lymphatic: Moderate aortoiliac atherosclerotic calcification. No aortic aneurysm. No pathologic adenopathy within the abdomen and pelvis. Reproductive: Prostate is unremarkable. Other: No abdominal wall hernia identified.  Rectum unremarkable. Musculoskeletal: No acute bone abnormality. No lytic or blastic bone lesion. IMPRESSION: Interval migration of a right renal pelvic calculus into the right ureteropelvic junction resulting in obstruction with resultant mild to moderate right hydronephrosis and moderate perinephric stranding. Obstructing calculus measures 6 x 7 x 9 mm. Moderate superimposed bilateral nonobstructing nephrolithiasis. Large hiatal hernia with displacement of the entire stomach into the left hemithorax, unchanged. Aortic Atherosclerosis (ICD10-I70.0). Electronically Signed   By: Fidela Salisbury MD   On: 07/05/2020 22:35    Impression:   70 y.o. male admitted with severe renal colic which could not be adequately controlled for outpatient management  Plan:    Scheduled for cystoscopy with placement right ureteral stent today for pain management.  The procedure was discussed in detail including potential risks of bleeding, infection and ureteral injury.  All questions were answered and he desires to proceed   07/06/2020, 11:30 AM  John Giovanni,  MD

## 2020-07-06 NOTE — Op Note (Signed)
Preoperative diagnosis:  1. Right proximal ureteral calculus 2. Intractable right renal colic  Postoperative diagnosis:  1. Same  Procedure:  1. Cystoscopy 2. Right ureteral stent placement (6FR/26 cm) 3. Right retrograde pyelography with interpretation   Surgeon: Nicki Reaper C. Danajah Birdsell, M.D.  Anesthesia: General  Complications: None  Intraoperative findings: 1. Cystoscopy-urethra normal in caliber without stricture; hypervascular prostate with minimal lateral lobe enlargement and marked bladder neck elevation; prominent superficial vessels bladder mucosa with moderate trabeculation; no solid or papillary lesions; UOs normal-appearing bilaterally 2. Right retrograde pyelogram with moderate hydronephrosis to the proximal ureter  EBL: Minimal  Specimens: Urine from right renal pelvis for C&S  Indication: DARRAGH NAY is a 70 y.o. with recurrent stone disease and intermittent bilateral back pain.  CT showed bilateral nephrolithiasis and a 7 mm renal pelvic calculus which was felt to be causing intermittent symptoms.  Was scheduled for right ureteroscopy in early June however presented to ED last night with severe right renal colic and CT showed migration of the renal pelvic stone to the proximal ureter.  He presents for stent placement for pain control.  After reviewing the management options for treatment, he elected to proceed with the above surgical procedure(s). We have discussed the potential benefits and risks of the procedure, side effects of the proposed treatment, the likelihood of the patient achieving the goals of the procedure, and any potential problems that might occur during the procedure or recuperation. Informed consent has been obtained.  Description of procedure:  The patient was taken to the operating room and general anesthesia was induced.  The patient was placed in the dorsal lithotomy position, prepped and draped in the usual sterile fashion, and preoperative  antibiotics were administered. A preoperative time-out was performed.   A 21 French cystoscope was lubricated, passed per urethra and advanced proximally under direct vision with findings as described above.  A 0.38 sensor guidewire was then advanced up the ureter into the renal pelvis under fluoroscopic guidance without difficulty.  Once the guidewire was placed there was brisk efflux of urine with particulate matter noted.  A 5 French open-ended ureteral catheter was then advanced over the wire to the region of the renal pelvis.  The guidewire was removed and 5 cc of amber urine was aspirated and sent for culture.  Retrograde pyelogram was then performed through the ureteral catheter with findings as described above.  The guidewire was replaced and the ureteral catheter was removed.  A 6FR/26 cm Contour ureteral stent was then advanced over the wire without difficulty. The stent was positioned appropriately under fluoroscopic and cystoscopic guidance.  The wire was then removed with an adequate stent curl noted in the renal pelvis as well as in the bladder.  Brisk efflux of urine was noted through the stent.  The bladder was then emptied and the procedure ended.  The patient appeared to tolerate the procedure well and without complications.  After anesthetic reversal patient was transported to PACU in stable condition.  Plan:  Proceed with ureteroscopy for definitive stone treatment scheduled in early June 2022   John Giovanni, MD

## 2020-07-06 NOTE — Care Management Obs Status (Signed)
[place] NOTIFICATION   Patient Details  Name: James Santiago MRN: [id number] Date of Birth: Apr 10, 1950   Medicare Observation Status Notification Given:  Yes    Kerin Salen, RN 07/06/2020, 3:35 PM

## 2020-07-06 NOTE — Anesthesia Procedure Notes (Signed)
Procedure Name: Intubation Date/Time: 07/06/2020 12:29 PM Performed by: Allean Found, CRNA Pre-anesthesia Checklist: Patient identified, Patient being monitored, Timeout performed, Emergency Drugs available and Suction available Patient Re-evaluated:Patient Re-evaluated prior to induction Oxygen Delivery Method: Circle system utilized Preoxygenation: Pre-oxygenation with 100% oxygen Induction Type: IV induction Ventilation: Mask ventilation without difficulty Laryngoscope Size: McGraph and 4 Grade View: Grade I Tube type: Oral Tube size: 7.0 mm Number of attempts: 1 Airway Equipment and Method: Stylet Placement Confirmation: ETT inserted through vocal cords under direct vision,  positive ETCO2 and breath sounds checked- equal and bilateral Secured at: 21 cm Tube secured with: Tape Dental Injury: Teeth and Oropharynx as per pre-operative assessment

## 2020-07-06 NOTE — Transfer of Care (Signed)
Immediate Anesthesia Transfer of Care Note  Patient: James Santiago  Procedure(s) Performed: CYSTOSCOPY/URETEROSCOPY/STENT PLACEMENT (Right Ureter)  Patient Location: PACU  Anesthesia Type:General  Level of Consciousness: drowsy and patient cooperative  Airway & Oxygen Therapy: Patient Spontanous Breathing and Patient connected to face mask oxygen  Post-op Assessment: Report given to RN and Post -op Vital signs reviewed and stable  Post vital signs: Reviewed and stable  Last Vitals:  Vitals Value Taken Time  BP 144/101 07/06/20 1315  Temp    Pulse 67 07/06/20 1316  Resp 16 07/06/20 1316  SpO2 100 % 07/06/20 1316  Vitals shown include unvalidated device data.  Last Pain:  Vitals:   07/06/20 1048  TempSrc: Temporal  PainSc: 8       Patients Stated Pain Goal: 0 (04/24/79 1886)  Complications: No complications documented.

## 2020-07-06 NOTE — Anesthesia Preprocedure Evaluation (Signed)
Anesthesia Evaluation  Patient identified by MRN, date of birth, ID band Patient awake    Reviewed: Allergy & Precautions, H&P , NPO status , Patient's Chart, lab work & pertinent test results  History of Anesthesia Complications Negative for: history of anesthetic complications  Airway Mallampati: II  TM Distance: >3 FB Neck ROM: Full    Dental no notable dental hx. (+) Edentulous Upper, Edentulous Lower   Pulmonary neg pulmonary ROS, neg sleep apnea, neg COPD, Patient abstained from smoking.Not current smoker, former smoker,  CXR: large hiatal hernia   Pulmonary exam normal breath sounds clear to auscultation       Cardiovascular Exercise Tolerance: Good METShypertension, Pt. on medications (-) CAD, (-) Past MI and (-) DOE Normal cardiovascular exam+ dysrhythmias Atrial Fibrillation  Rhythm:Irregular Rate:Normal - Systolic murmurs ECG 2-48-25-0: NSR   Neuro/Psych  Neuromuscular disease negative psych ROS   GI/Hepatic Neg liver ROS, hiatal hernia, GERD  Medicated and Controlled,  Endo/Other  negative endocrine ROSneg diabetes  Renal/GU negative Renal ROS  negative genitourinary   Musculoskeletal negative musculoskeletal ROS (+)   Abdominal   Peds negative pediatric ROS (+)  Hematology negative hematology ROS (+)   Anesthesia Other Findings Past Medical History: No date: A-fib [facility])     Comment:  a. Dx 01/2012, CHADS2 = 1.-[facility] in past. 06/2003: Adenomatous colon polyp No date: Arthritis     Comment:  "fingers" (02/01/2012) No date: Atrial fibrillation ([place]) No date: Biliary dyskinesia No date: Cholelithiasis     Comment:  "still got 2 small stones in there" (02/01/2012) No date: Esophageal stricture No date: GERD (gastroesophageal reflux disease) No date: Gout No date: Hemorrhoids No date: Hiatal hernia No date: History of colon polyps No date: Hypercholesteremia No date: Hypertension No date:  Kidney stone No date: Nephrolithiasis     Comment:  "was told kidney function at 65%"- hx. kidney stones  Reproductive/Obstetrics negative OB ROS                             Anesthesia Physical  Anesthesia Plan  ASA: III  Anesthesia Plan: General   Post-op Pain Management:    Induction: Intravenous  PONV Risk Score and Plan: 3 and Ondansetron and Dexamethasone  Airway Management Planned: Oral ETT  Additional Equipment: None  Intra-op Plan:   Post-operative Plan: Extubation in OR  Informed Consent: I have reviewed the patients History and Physical, chart, labs and discussed the procedure including the risks, benefits and alternatives for the proposed anesthesia with the patient or authorized representative who has indicated his/her understanding and acceptance.     Dental advisory given  Plan Discussed with: CRNA  Anesthesia Plan Comments: (Discussed risks of anesthesia with patient, including PONV, sore throat, lip/dental damage. Rare risks discussed as well, such as cardiorespiratory and neurological sequelae. Patient understands.  Last vomited yesterday. No nausea today.)       Anesthesia Quick Evaluation

## 2020-07-07 ENCOUNTER — Other Ambulatory Visit: Payer: Medicare Other

## 2020-07-07 ENCOUNTER — Other Ambulatory Visit: Payer: Self-pay

## 2020-07-07 ENCOUNTER — Ambulatory Visit: Admit: 2020-07-07 | Payer: Medicare Other | Admitting: Urology

## 2020-07-07 DIAGNOSIS — N200 Calculus of kidney: Secondary | ICD-10-CM

## 2020-07-07 LAB — URINE CULTURE: Culture: NO GROWTH

## 2020-07-08 ENCOUNTER — Other Ambulatory Visit: Payer: Self-pay

## 2020-07-08 ENCOUNTER — Other Ambulatory Visit: Payer: Self-pay | Admitting: Family Medicine

## 2020-07-08 ENCOUNTER — Encounter
Admission: RE | Admit: 2020-07-08 | Discharge: 2020-07-08 | Disposition: A | Discharge status: Home / Self Care | Payer: Medicare Other | Source: Ambulatory Visit | Attending: Urology | Admitting: Urology

## 2020-07-08 DIAGNOSIS — I10 Essential (primary) hypertension: Secondary | ICD-10-CM

## 2020-07-08 HISTORY — DX: Personal history of urinary calculi: Z87.442

## 2020-07-08 NOTE — Patient Instructions (Signed)
Your procedure is scheduled on: 07/19/20 Report to [facility]. To find out your arrival time please call [phone] between 1PM - 3PM on 07/18/20.  Remember: Instructions that are not followed completely may result in serious medical risk, up to and including death, or upon the discretion of your surgeon and anesthesiologist your surgery may need to be rescheduled.     _X__ 1. Do not eat food or drink any liquids after midnight the night before your procedure.                 No gum chewing or hard candies.   __X__2.  On the morning of surgery brush your teeth with toothpaste and water, you                 may rinse your mouth with mouthwash if you wish.  Do not swallow any              toothpaste of mouthwash.     _X__ 3.  No Alcohol for 24 hours before or after surgery.   _X__ 4.  Do Not Smoke or use e-cigarettes For 24 Hours Prior to Your Surgery.                 Do not use any chewable tobacco products for at least 6 hours prior to                 surgery.  ____  5.  Bring all medications with you on the day of surgery if instructed.   __X__  6.  Notify your doctor if there is any change in your medical condition      (cold, fever, infections).     Do not wear jewelry, make-up, hairpins, clips or nail polish. Do not wear lotions, powders, or perfumes.  Do not shave 48 hours prior to surgery. Men may shave face and neck. Do not bring valuables to the hospital.    [facility] is not responsible for any belongings or valuables.  Contacts, dentures/partials or body piercings may not be worn into surgery. Bring a case for your contacts, glasses or hearing aids, a denture cup will be supplied. Leave your suitcase in the car. After surgery it may be brought to your room. For patients admitted to the hospital, discharge time is determined by your treatment team.   Patients discharged the day of surgery will not be allowed to drive  home.   Please read over the following fact sheets that you were given:     __X__ Take these medicines the morning of surgery with A SIP OF WATER:    1. atenolol (TENORMIN) 50 MG tablet  2. DEXILANT 60 MG capsule  3. doxazosin (CARDURA) 4 MG tablet  4. allopurinol (ZYLOPRIM) 300 MG tablet  5. amLODipine (NORVASC) 2.5 MG tablet if Systolic BP > 491  6. oxyCODONE-acetaminophen (PERCOCET/ROXICET) 5-325 MG tablet if needed  ____ Fleet Enema (as directed)   ____ Use CHG Soap/SAGE wipes as directed  ____ Use inhalers on the day of surgery  ____ Stop metformin/Janumet/Farxiga 2 days prior to surgery    ____ Take 1/2 of usual insulin dose the night before surgery. No insulin the morning          of surgery.   __X__ Stop Blood Thinners Coumadin/Plavix/Xarelto/Pleta/Pradaxa/Eliquis/Effient/Aspirin  on   Or contact your Surgeon, Cardiologist or Medical Doctor regarding  ability to stop your blood thinners  STOP ELIQUIS AS INSTRUCTED ON 07/16/20  __X__ Stop Anti-inflammatories 7 days before surgery such as Advil, Ibuprofen, Motrin,  BC or Goodies Powder, Naprosyn, Naproxen, Aleve, Aspirin    __X__ Stop all herbal supplements, fish oil or vitamin E until after surgery.    ____ Bring C-Pap to the hospital.

## 2020-07-12 ENCOUNTER — Encounter: Payer: Self-pay | Admitting: Family Medicine

## 2020-07-12 ENCOUNTER — Ambulatory Visit: Payer: Medicare Other | Admitting: Family Medicine

## 2020-07-12 ENCOUNTER — Other Ambulatory Visit: Payer: Self-pay

## 2020-07-12 VITALS — BP 140/82 | HR 86 | Temp 98.7°F | Resp 14 | Ht 74.0 in | Wt 240.0 lb

## 2020-07-12 DIAGNOSIS — N132 Hydronephrosis with renal and ureteral calculous obstruction: Secondary | ICD-10-CM

## 2020-07-12 DIAGNOSIS — N183 Chronic kidney disease, stage 3 unspecified: Secondary | ICD-10-CM | POA: Diagnosis not present

## 2020-07-12 DIAGNOSIS — I10 Essential (primary) hypertension: Secondary | ICD-10-CM | POA: Diagnosis not present

## 2020-07-12 DIAGNOSIS — N200 Calculus of kidney: Secondary | ICD-10-CM | POA: Diagnosis not present

## 2020-07-12 NOTE — Progress Notes (Signed)
Subjective:    Patient ID: James Santiago, male    DOB: 24-Jan-1951, 70 y.o.   MRN: [id number]  HPI  Patient is a very pleasant 70 year old Caucasian male here today for hospital follow up.  DATE OF ADMISSION:  07/05/2020    ADMITTING PHYSICIAN: Marcelyn Bruins, MD  DATE OF DISCHARGE: 07/06/2020  PRIMARY CARE PHYSICIAN: Susy Frizzle, MD    ADMISSION DIAGNOSIS:  Hydronephrosis with renal calculous obstruction [N13.2]  DISCHARGE DIAGNOSIS:  Principal Problem:   Hydronephrosis with renal calculous obstruction Active Problems:   GERD (gastroesophageal reflux disease)   Gout   Atrial fibrillation (HCC)   Essential hypertension   Hyperlipemia   Bilateral nephrolithiasis   SECONDARY DIAGNOSIS:       Past Medical History:  Diagnosis Date  . A-fib ([place])    a. Dx 01/2012, CHADS2 = 1.-[facility] in past.  . Adenomatous colon polyp 06/2003  . Arthritis    "fingers" (02/01/2012)  . Atrial fibrillation ([place])   . Biliary dyskinesia   . Cholelithiasis    "still got 2 small stones in there" (02/01/2012)  . Esophageal stricture   . GERD (gastroesophageal reflux disease)   . Gout   . Hemorrhoids   . Hiatal hernia   . History of colon polyps   . Hypercholesteremia   . Hypertension   . Kidney stone   . Nephrolithiasis    "was told kidney function at 65%"- hx. kidney stones    HOSPITAL COURSE:   1.  Right hydronephrosis with obstructing renal calculus.  Dr. Bernardo Heater took to the operating room for stent placement.  Patient was given preoperative antibiotic.  Patient was feeling well after stent placement and was interested in going home.  Follow-up with urology for definitive stone management as outpatient.  Stay hydrated. 2.  Lactic acidosis improved with IV fluid hydration 3.  Essential hypertension.  On doxazosin, atenolol and telmisartan 4.  Chronic atrial fibrillation.  Can go back on Coumadin as outpatient but likely will have to be held  prior to stone procedure 5.  Chronic gout on allopurinol 6.  Hyperlipidemia unspecified continue simvastatin. 7.  GERD on PPI 8.  Acute kidney injury on chronic kidney disease stage IIIa.  Creatinine did go up to 1.91.  With the stent placement creatinine should come down.  Recommend checking a BMP and follow-up appointment.  I reviewed his CT scan from the hospital.  The results are listed below: Adrenals/Urinary Tract: The adrenal glands are unremarkable. The kidneys are normal in size and position. There is interval development of mild to moderate right hydronephrosis and moderate right perinephric stranding secondary to an obstructing 6 mm x 7 mm x 9 mm calculus within the right ureteropelvic junction which has migrated slightly distally since prior examination. Extensive bilateral nonobstructing nephrolithiasis is otherwise stable with at least 6 additional nonobstructing right renal calculi measuring up to 5 mm and 7 additional left-sided nonobstructing calculi measuring up to 11 mm. No ureteral calculi on the left. No hydronephrosis on the left. Multiple simple cortical cysts are again seen bilaterally. The bladder is unremarkable.  Patient states that ever since he had the stent placed, the pain in his right flank has improved.  He is not having to take any pain medication at the present time.  He does have some oxycodone in case the pain returns but overall has been doing well.  He states that he is having increased urinary frequency since the stent was placed.  He also has some spasms  of pain whenever he has to urinate however this is manageable.  He is also seeing some blood after the stent was placed.  However he denies any fevers or chills or nausea or vomiting or any symptoms that would suggest a urinary tract infection.  He has an appointment for surgery for stone extraction on Tuesday.  They have asked him to hold his Eliquis 3 days prior to surgery.  Overall he is doing well.  He  is here today to recheck his creatinine.  Baseline creatinine is around 1.5.  It had increased to 1.9 due to right-sided hydronephrosis.  He is also due to check his uric acid level.  Patient has been told that his kidney stones are uric acid stones.  Therefore I would like to try to drive his uric acid level as low as possible.  Although 6 would be guidelines, the patient may benefit from uric acid level as low as 4.  I reviewed his CT scan from the hospital.  The results are listed below Past Medical History:  Diagnosis Date  . A-fib ([place])    a. Dx 01/2012, CHADS2 = 1.-[facility] in past.  . Adenomatous colon polyp 06/2003  . Arthritis    "fingers" (02/01/2012)  . Atrial fibrillation ([place])   . Biliary dyskinesia   . Cholelithiasis    "still got 2 small stones in there" (02/01/2012)  . Esophageal stricture   . GERD (gastroesophageal reflux disease)   . Gout   . Hemorrhoids   . Hiatal hernia   . History of colon polyps   . History of kidney stones   . Hypercholesteremia   . Hypertension   . Kidney stone   . Nephrolithiasis    "was told kidney function at 65%"- hx. kidney stones   Current Outpatient Medications on File Prior to Visit  Medication Sig Dispense Refill  . allopurinol (ZYLOPRIM) 300 MG tablet TAKE 1 TABLET BY MOUTH  DAILY (Patient taking differently: Take 300 mg by mouth daily.) 90 tablet 3  . amLODipine (NORVASC) 2.5 MG tablet Take 1 tablet (2.5 mg total) by mouth daily as needed (If Blood pressure is over 130).    Marland Kitchen atenolol (TENORMIN) 50 MG tablet TAKE 1 TABLET BY MOUTH  DAILY (Patient taking differently: Take 75 mg by mouth daily.) 90 tablet 3  . calcium carbonate (TUMS EX) 750 MG chewable tablet Chew 1 tablet by mouth daily as needed for indigestion or heartburn.    Marland Kitchen DEXILANT 60 MG capsule TAKE 1 CAPSULE BY MOUTH  DAILY (Patient taking differently: Take 60 mg by mouth daily.) 90 capsule 3  . doxazosin (CARDURA) 4 MG tablet TAKE 1 TABLET BY MOUTH  DAILY (Patient taking  differently: Take 4 mg by mouth daily.) 90 tablet 3  . ELIQUIS 5 MG TABS tablet TAKE 1 TABLET BY MOUTH  TWICE DAILY (Patient taking differently: Take 5 mg by mouth 2 (two) times daily. Hold 07/16/20 for surgery) 180 tablet 3  . famotidine (PEPCID) 20 MG tablet Take 20 mg by mouth daily as needed for heartburn or indigestion.    Marland Kitchen oxyCODONE-acetaminophen (PERCOCET/ROXICET) 5-325 MG tablet Take 1 tablet by mouth every 6 (six) hours as needed for severe pain. 10 tablet 0  . simethicone (MYLICON) 283 MG chewable tablet Chew 125 mg by mouth every 6 (six) hours as needed for flatulence.    . simvastatin (ZOCOR) 10 MG tablet TAKE 1 TABLET BY MOUTH AT  BEDTIME (Patient taking differently: Take 10 mg by mouth at bedtime.)  90 tablet 3  . telmisartan (MICARDIS) 80 MG tablet TAKE 1 TABLET BY MOUTH IN  THE MORNING (Patient taking differently: Take 80 mg by mouth daily.) 90 tablet 3   No current facility-administered medications on file prior to visit.   Allergies  Allergen Reactions  . Amoxicillin Diarrhea  . Augmentin [Amoxicillin-Pot Clavulanate]     Diarrhea and sick   Social History   Socioeconomic History  . Marital status: Married    Spouse name: Not on file  . Number of children: 1  . Years of education: Not on file  . Highest education level: Not on file  Occupational History    Employer: UNEMPLOYED  Tobacco Use  . Smoking status: Former Smoker    Packs/day: 0.50    Years: 12.00    Pack years: 6.00    Types: Cigarettes  . Smokeless tobacco: Current User    Types: Chew  . Tobacco comment: 02/01/2012 smoked cigarettes "probably 30 yr ago."  Currently uses 1/2 pouch of chewing tobacco daily; offered cessation materials; pt declines.  Vaping Use  . Vaping Use: Never used  Substance and Sexual Activity  . Alcohol use: Not Currently    Alcohol/week: 0.0 standard drinks    Comment: occ. beer  . Drug use: No  . Sexual activity: Not Currently  Other Topics Concern  . Not on file  Social  History Narrative   Lives in [place] with wife.  They have one grown child and a grandson.  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   Social Determinants of Health   Financial Resource Strain: Not on file  Food Insecurity: Not on file  Transportation Needs: Not on file  Physical Activity: Not on file  Stress: Not on file  Social Connections: Not on file  Intimate Partner Violence: Not on file   Family History  Problem Relation Age of Onset  . Lung cancer Father        died @ 67.  Also had PPM  . Heart disease Father   . Kidney disease Mother        died @ 31. Also had h/o CVA, breat cancer, diabetes, and PPM  . Breast cancer Mother   . Diabetes Mother   . Heart disease Mother   . Cancer Brother        esophagus  . Esophageal cancer Brother   . Colon cancer Neg Hx   . Rectal cancer Neg Hx   . Stomach cancer Neg Hx       Review of Systems  All other systems reviewed and are negative.      Objective:   Physical Exam Vitals reviewed.  Constitutional:      General: He is not in acute distress.    Appearance: He is well-developed. He is not diaphoretic.  HENT:     Head: Normocephalic and atraumatic.     Right Ear: External ear normal.     Left Ear: External ear normal.     Nose: Nose normal.     Mouth/Throat:     Pharynx: No oropharyngeal exudate.  Eyes:     General: No scleral icterus.       Right eye: No discharge.        Left eye: No discharge.     Conjunctiva/sclera: Conjunctivae normal.     Pupils: Pupils are equal, round, and reactive to light.  Neck:     Thyroid: No thyromegaly.     Vascular: No  JVD.     Trachea: No tracheal deviation.  Cardiovascular:     Rate and Rhythm: Normal rate and regular rhythm.     Heart sounds: Normal heart sounds. No murmur heard. No friction rub. No gallop.   Pulmonary:     Effort: Pulmonary effort is normal. No respiratory distress.     Breath sounds: Normal breath sounds. No stridor. No  wheezing or rales.  Chest:     Chest wall: No tenderness.  Abdominal:     General: Bowel sounds are normal. There is no distension.     Palpations: Abdomen is soft. There is no mass.     Tenderness: There is no abdominal tenderness. There is no guarding or rebound.  Musculoskeletal:        General: No tenderness. Normal range of motion.     Cervical back: Normal range of motion and neck supple.     Right lower leg: No edema.     Left lower leg: No edema.  Lymphadenopathy:     Cervical: No cervical adenopathy.  Skin:    General: Skin is warm.     Coloration: Skin is not jaundiced or pale.     Findings: No bruising, erythema, lesion or rash.  Neurological:     Mental Status: He is alert and oriented to person, place, and time.     Cranial Nerves: No cranial nerve deficit.     Sensory: No sensory deficit.     Motor: No weakness or abnormal muscle tone.     Coordination: Coordination normal.     Gait: Gait normal.     Deep Tendon Reflexes: Reflexes are normal and symmetric.  Psychiatric:        Behavior: Behavior normal.        Thought Content: Thought content normal.        Judgment: Judgment normal.           Assessment & Plan:  Stage 3 chronic kidney disease, unspecified whether stage 3a or 3b CKD ([place]) - Plan: BASIC METABOLIC PANEL WITH GFR, CBC with Differential/Platelet, Uric acid  Kidney stone  Essential hypertension  Hydronephrosis with renal calculous obstruction - Plan: BASIC METABOLIC PANEL WITH GFR, CBC with Differential/Platelet  All things considered, he seems to be doing well.  He denies any symptoms of urinary tract infection.  He denies any pain or nausea or vomiting.  The surgery has already been scheduled for cystoscopy as well as stone extraction for Tuesday.  He is going to hold Eliquis 3 days prior.  Repeat creatinine today to monitor kidney function.  If still elevated, we may decrease or hold temporarily his angiotensin receptor blocker.  I will also  check uric acid level and I will uptitrate allopurinol to achieve a uric acid level near 4 if possible.

## 2020-07-13 LAB — CBC WITH DIFFERENTIAL/PLATELET
Absolute Monocytes: 501 cells/uL (ref 200–950)
Basophils Absolute: 50 cells/uL (ref 0–200)
Basophils Relative: 0.9 %
Eosinophils Absolute: 259 cells/uL (ref 15–500)
Eosinophils Relative: 4.7 %
HCT: 44.3 % (ref 38.5–50.0)
Hemoglobin: 14.6 g/dL (ref 13.2–17.1)
Lymphs Abs: 1441 cells/uL (ref 850–3900)
MCH: 30.5 pg (ref 27.0–33.0)
MCHC: 33 g/dL (ref 32.0–36.0)
MCV: 92.7 fL (ref 80.0–100.0)
MPV: 11.9 fL (ref 7.5–12.5)
Monocytes Relative: 9.1 %
Neutro Abs: 3251 cells/uL (ref 1500–7800)
Neutrophils Relative %: 59.1 %
Platelets: 154 10*3/uL (ref 140–400)
RBC: 4.78 10*6/uL (ref 4.20–5.80)
RDW: 14.2 % (ref 11.0–15.0)
Total Lymphocyte: 26.2 %
WBC: 5.5 10*3/uL (ref 3.8–10.8)

## 2020-07-13 LAB — BASIC METABOLIC PANEL WITH GFR
BUN/Creatinine Ratio: 9 (calc) (ref 6–22)
BUN: 14 mg/dL (ref 7–25)
CO2: 26 mmol/L (ref 20–32)
Calcium: 9.4 mg/dL (ref 8.6–10.3)
Chloride: 106 mmol/L (ref 98–110)
Creat: 1.49 mg/dL — ABNORMAL HIGH (ref 0.70–1.25)
GFR, Est African American: 55 mL/min/{1.73_m2} — ABNORMAL LOW (ref >=60)
GFR, Est Non African American: 48 mL/min/{1.73_m2} — ABNORMAL LOW (ref >=60)
Glucose, Bld: 100 mg/dL — ABNORMAL HIGH (ref 65–99)
Potassium: 4.1 mmol/L (ref 3.5–5.3)
Sodium: 140 mmol/L (ref 135–146)

## 2020-07-13 LAB — URIC ACID: Uric Acid, Serum: 4 mg/dL (ref 4.0–8.0)

## 2020-07-15 ENCOUNTER — Encounter: Payer: Medicare Other | Admitting: Urology

## 2020-07-18 MED ORDER — LACTATED RINGERS IV SOLN: INTRAVENOUS | Status: DC

## 2020-07-18 MED ORDER — CHLORHEXIDINE GLUCONATE 0.12 % MT SOLN: 15.0000 mL | Freq: Once | OROMUCOSAL | Status: AC

## 2020-07-18 MED ORDER — ORAL CARE MOUTH RINSE: 15.0000 mL | Freq: Once | OROMUCOSAL | Status: AC

## 2020-07-19 ENCOUNTER — Encounter
Admission: RE | Disposition: A | Discharge status: Home / Self Care | Payer: Self-pay | Source: Home / Self Care | Attending: Urology

## 2020-07-19 ENCOUNTER — Ambulatory Visit
Admission: RE | Admit: 2020-07-19 | Discharge: 2020-07-19 | Disposition: A | Discharge status: Home / Self Care | Payer: Medicare Other | Attending: Urology | Admitting: Urology

## 2020-07-19 ENCOUNTER — Encounter: Payer: Self-pay | Admitting: Urology

## 2020-07-19 ENCOUNTER — Ambulatory Visit: Payer: Medicare Other

## 2020-07-19 ENCOUNTER — Ambulatory Visit: Payer: Medicare Other | Admitting: Anesthesiology

## 2020-07-19 ENCOUNTER — Other Ambulatory Visit: Payer: Self-pay

## 2020-07-19 DIAGNOSIS — Z87891 Personal history of nicotine dependence: Secondary | ICD-10-CM | POA: Diagnosis not present

## 2020-07-19 DIAGNOSIS — N132 Hydronephrosis with renal and ureteral calculous obstruction: Secondary | ICD-10-CM | POA: Diagnosis present

## 2020-07-19 DIAGNOSIS — N400 Enlarged prostate without lower urinary tract symptoms: Secondary | ICD-10-CM | POA: Diagnosis not present

## 2020-07-19 DIAGNOSIS — Z79899 Other long term (current) drug therapy: Secondary | ICD-10-CM | POA: Insufficient documentation

## 2020-07-19 DIAGNOSIS — N200 Calculus of kidney: Secondary | ICD-10-CM

## 2020-07-19 DIAGNOSIS — Z7901 Long term (current) use of anticoagulants: Secondary | ICD-10-CM | POA: Diagnosis not present

## 2020-07-19 DIAGNOSIS — N23 Unspecified renal colic: Secondary | ICD-10-CM

## 2020-07-19 DIAGNOSIS — N201 Calculus of ureter: Secondary | ICD-10-CM | POA: Diagnosis not present

## 2020-07-19 HISTORY — PX: CYSTOSCOPY/URETEROSCOPY/HOLMIUM LASER/STENT PLACEMENT: SHX6546

## 2020-07-19 SURGERY — CYSTOSCOPY/URETEROSCOPY/HOLMIUM LASER/STENT PLACEMENT
Anesthesia: General | Laterality: Right

## 2020-07-19 MED ORDER — CHLORHEXIDINE GLUCONATE 0.12 % MT SOLN
OROMUCOSAL | Status: AC
  Administered 2020-07-19: 15 mL via OROMUCOSAL
  Filled 2020-07-19: qty 15

## 2020-07-19 MED ORDER — PROPOFOL 10 MG/ML IV BOLUS
INTRAVENOUS | Status: AC
  Filled 2020-07-19: qty 20

## 2020-07-19 MED ORDER — PHENYLEPHRINE HCL (PRESSORS) 10 MG/ML IV SOLN
INTRAVENOUS | Status: DC | PRN
  Administered 2020-07-19 (×2): 200 ug via INTRAVENOUS

## 2020-07-19 MED ORDER — FAMOTIDINE 20 MG PO TABS
ORAL_TABLET | ORAL | Status: AC
  Administered 2020-07-19: 20 mg via ORAL
  Filled 2020-07-19: qty 1

## 2020-07-19 MED ORDER — CEFAZOLIN SODIUM-DEXTROSE 2-4 GM/100ML-% IV SOLN
INTRAVENOUS | Status: AC
  Filled 2020-07-19: qty 100

## 2020-07-19 MED ORDER — EPHEDRINE SULFATE 50 MG/ML IJ SOLN
INTRAMUSCULAR | Status: DC | PRN
  Administered 2020-07-19 (×2): 10 mg via INTRAVENOUS

## 2020-07-19 MED ORDER — SUGAMMADEX SODIUM 200 MG/2ML IV SOLN
INTRAVENOUS | Status: DC | PRN
  Administered 2020-07-19: 200 mg via INTRAVENOUS

## 2020-07-19 MED ORDER — ONDANSETRON HCL 4 MG/2ML IJ SOLN: 4.0000 mg | Freq: Once | INTRAMUSCULAR | Status: DC | PRN

## 2020-07-19 MED ORDER — FENTANYL CITRATE (PF) 100 MCG/2ML IJ SOLN: 25.0000 ug | INTRAMUSCULAR | Status: DC | PRN

## 2020-07-19 MED ORDER — IOHEXOL 180 MG/ML  SOLN
INTRAMUSCULAR | Status: DC | PRN
  Administered 2020-07-19 (×2): 5 mL

## 2020-07-19 MED ORDER — FENTANYL CITRATE (PF) 100 MCG/2ML IJ SOLN
INTRAMUSCULAR | Status: AC
  Filled 2020-07-19: qty 2

## 2020-07-19 MED ORDER — LACTATED RINGERS IV SOLN: INTRAVENOUS | Status: DC | PRN

## 2020-07-19 MED ORDER — CEFAZOLIN SODIUM-DEXTROSE 2-4 GM/100ML-% IV SOLN
2.0000 g | INTRAVENOUS | Status: AC
  Administered 2020-07-19: 2 g via INTRAVENOUS

## 2020-07-19 MED ORDER — DEXAMETHASONE SODIUM PHOSPHATE 10 MG/ML IJ SOLN
INTRAMUSCULAR | Status: DC | PRN
  Administered 2020-07-19: 10 mg via INTRAVENOUS

## 2020-07-19 MED ORDER — PROPOFOL 10 MG/ML IV BOLUS
INTRAVENOUS | Status: DC | PRN
  Administered 2020-07-19: 180 mg via INTRAVENOUS

## 2020-07-19 MED ORDER — MIDAZOLAM HCL 2 MG/2ML IJ SOLN
INTRAMUSCULAR | Status: DC | PRN
  Administered 2020-07-19: 2 mg via INTRAVENOUS

## 2020-07-19 MED ORDER — ROCURONIUM BROMIDE 100 MG/10ML IV SOLN
INTRAVENOUS | Status: DC | PRN
  Administered 2020-07-19: 10 mg via INTRAVENOUS
  Administered 2020-07-19: 70 mg via INTRAVENOUS

## 2020-07-19 MED ORDER — ONDANSETRON HCL 4 MG/2ML IJ SOLN
INTRAMUSCULAR | Status: DC | PRN
  Administered 2020-07-19: 4 mg via INTRAVENOUS

## 2020-07-19 MED ORDER — FENTANYL CITRATE (PF) 100 MCG/2ML IJ SOLN
INTRAMUSCULAR | Status: DC | PRN
  Administered 2020-07-19 (×2): 50 ug via INTRAVENOUS

## 2020-07-19 MED ORDER — LIDOCAINE HCL (CARDIAC) PF 100 MG/5ML IV SOSY
PREFILLED_SYRINGE | INTRAVENOUS | Status: DC | PRN
  Administered 2020-07-19: 100 mg via INTRAVENOUS

## 2020-07-19 MED ORDER — FAMOTIDINE 20 MG PO TABS: 20.0000 mg | ORAL_TABLET | Freq: Once | ORAL | Status: AC

## 2020-07-19 SURGICAL SUPPLY — 29 items
BAG DRAIN CYSTO-URO LG1000N (MISCELLANEOUS) ×2 IMPLANT
BASKET ZERO TIP 1.9FR (BASKET) ×2 IMPLANT
BRUSH SCRUB EZ 1% IODOPHOR (MISCELLANEOUS) ×2 IMPLANT
BSKT STON RTRVL ZERO TP 1.9FR (BASKET) ×1
CATH URET FLEX-TIP 2 LUMEN 10F (CATHETERS) ×2 IMPLANT
CATH URETL 5X70 OPEN END (CATHETERS) IMPLANT
CNTNR SPEC 2.5X3XGRAD LEK (MISCELLANEOUS) ×1
CONT SPEC 4OZ STER OR WHT (MISCELLANEOUS) ×1
CONT SPEC 4OZ STRL OR WHT (MISCELLANEOUS) ×1
CONTAINER SPEC 2.5X3XGRAD LEK (MISCELLANEOUS) ×1 IMPLANT
DRAPE UTILITY 15X26 TOWEL STRL (DRAPES) ×2 IMPLANT
GLOVE SURG UNDER POLY LF SZ7.5 (GLOVE) ×2 IMPLANT
GOWN STRL REUS W/ TWL LRG LVL3 (GOWN DISPOSABLE) ×1 IMPLANT
GOWN STRL REUS W/ TWL XL LVL3 (GOWN DISPOSABLE) ×1 IMPLANT
GOWN STRL REUS W/TWL LRG LVL3 (GOWN DISPOSABLE) ×2
GOWN STRL REUS W/TWL XL LVL3 (GOWN DISPOSABLE) ×2
GUIDEWIRE STR DUAL SENSOR (WIRE) ×4 IMPLANT
INFUSOR MANOMETER BAG 3000ML (MISCELLANEOUS) ×2 IMPLANT
IV NS IRRIG 3000ML ARTHROMATIC (IV SOLUTION) ×2 IMPLANT
KIT TURNOVER CYSTO (KITS) ×2 IMPLANT
PACK CYSTO AR (MISCELLANEOUS) ×2 IMPLANT
SET CYSTO W/LG BORE CLAMP LF (SET/KITS/TRAYS/PACK) ×2 IMPLANT
SHEATH URETERAL 12FRX35CM (MISCELLANEOUS) ×2 IMPLANT
STENT URET 6FRX24 CONTOUR (STENTS) IMPLANT
STENT URET 6FRX26 CONTOUR (STENTS) ×2 IMPLANT
SURGILUBE 2OZ TUBE FLIPTOP (MISCELLANEOUS) ×2 IMPLANT
TRACTIP FLEXIVA PULSE ID 200 (Laser) ×2 IMPLANT
VALVE UROSEAL ADJ ENDO (VALVE) ×2 IMPLANT
WATER STERILE IRR 1000ML POUR (IV SOLUTION) ×2 IMPLANT

## 2020-07-19 NOTE — Transfer of Care (Signed)
Immediate Anesthesia Transfer of Care Note  Patient: James Santiago  Procedure(s) Performed: CYSTOSCOPY/URETEROSCOPY/HOLMIUM LASER/STENT EXCHANGE (Right )  Patient Location: PACU  Anesthesia Type:General  Level of Consciousness: awake and alert   Airway & Oxygen Therapy: Patient Spontanous Breathing and Patient connected to face mask oxygen  Post-op Assessment: Report given to RN and Post -op Vital signs reviewed and stable  Post vital signs: Reviewed and stable  Last Vitals:  Vitals Value Taken Time  BP    Temp    Pulse 86 07/19/20 1153  Resp 13 07/19/20 1153  SpO2 100 % 07/19/20 1153  Vitals shown include unvalidated device data.  Last Pain:  Vitals:   07/19/20 0739  TempSrc: Temporal  PainSc: 0-No pain         Complications: No complications documented.

## 2020-07-19 NOTE — Interval H&P Note (Signed)
History and Physical Interval Note: 70 y.o. male presents for definitive treatment of right ureteral/renal calculi.  Underwent stent placement approximately 2 weeks ago for pain management which he has been tolerating well.  All questions were answered and he desires to proceed  07/19/2020 8:37 AM  James Santiago  has presented today for surgery, with the diagnosis of right nephrolithiasis.  The various methods of treatment have been discussed with the patient and family. After consideration of risks, benefits and other options for treatment, the patient has consented to  Procedure(s): CYSTOSCOPY/URETEROSCOPY/HOLMIUM LASER/STENT EXCHANGE (Right) as a surgical intervention.  The patient's history has been reviewed, patient examined, no change in status, stable for surgery.  I have reviewed the patient's chart and labs.  Questions were answered to the patient's satisfaction.     [place]

## 2020-07-19 NOTE — Anesthesia Procedure Notes (Signed)
Procedure Name: Intubation Date/Time: 07/19/2020 10:15 AM Performed by: Philbert Riser, CRNA Pre-anesthesia Checklist: Patient identified, Emergency Drugs available, Suction available and Patient being monitored Patient Re-evaluated:Patient Re-evaluated prior to induction Oxygen Delivery Method: Circle system utilized Preoxygenation: Pre-oxygenation with 100% oxygen Induction Type: IV induction Ventilation: Mask ventilation without difficulty Laryngoscope Size: McGraph and 3 Tube type: Oral Tube size: 7.5 mm Number of attempts: 1 Airway Equipment and Method: Stylet and Oral airway Placement Confirmation: ETT inserted through vocal cords under direct vision,  positive ETCO2 and breath sounds checked- equal and bilateral Secured at: 22 cm Tube secured with: Tape Dental Injury: Teeth and Oropharynx as per pre-operative assessment

## 2020-07-19 NOTE — Anesthesia Preprocedure Evaluation (Signed)
Anesthesia Evaluation  Patient identified by MRN, date of birth, ID band Patient awake    Reviewed: Allergy & Precautions, H&P , NPO status , Patient's Chart, lab work & pertinent test results, reviewed documented beta blocker date and time   Airway Mallampati: III  TM Distance: >3 FB Neck ROM: full    Dental  (+) Edentulous Upper, Edentulous Lower   Pulmonary neg pulmonary ROS, former smoker,    Pulmonary exam normal        Cardiovascular Exercise Tolerance: Good hypertension, On Medications negative cardio ROS  Atrial Fibrillation  Rate:Normal     Neuro/Psych negative neurological ROS  negative psych ROS   GI/Hepatic Neg liver ROS, hiatal hernia, GERD  Medicated,  Endo/Other  negative endocrine ROS  Renal/GU CRFRenal disease  negative genitourinary   Musculoskeletal   Abdominal   Peds  Hematology negative hematology ROS (+)   Anesthesia Other Findings   Reproductive/Obstetrics negative OB ROS                             Anesthesia Physical Anesthesia Plan  ASA: III  Anesthesia Plan: General LMA   Post-op Pain Management:    Induction:   PONV Risk Score and Plan: 3  Airway Management Planned:   Additional Equipment:   Intra-op Plan:   Post-operative Plan:   Informed Consent: I have reviewed the patients History and Physical, chart, labs and discussed the procedure including the risks, benefits and alternatives for the proposed anesthesia with the patient or authorized representative who has indicated his/her understanding and acceptance.       Plan Discussed with: CRNA  Anesthesia Plan Comments:         Anesthesia Quick Evaluation

## 2020-07-19 NOTE — Op Note (Signed)
Preoperative diagnosis:  1. Right nephrolithiasis 2. Right proximal ureteral calculus  Postoperative diagnosis:  1. Right nephrolithiasis  Procedure:  1. Cystoscopy 2. Right ureteroscopy and stone removal 3. Ureteroscopic laser lithotripsy 4. Right ureteral stent exchange (6FR/26 cm 5. Right retrograde pyelography with interpretation  Surgeon: Nicki Reaper C. Shalayne Leach, M.D.  Anesthesia: General  Complications: None  Intraoperative findings:  1. Cystoscopy-urethra normal in caliber without stricture; prostate with mild lateral lobe enlargement and marked bladder neck elevation; bladder mucosa without solid or papillary lesions, inflammatory changes right UO secondary to indwelling stent 2. Ureteropyeloscopy-normal appearing ureteral mucosa without stricture or lesions.  The proximal ureteral calculus was not visualized and was placed back into the collecting system with stent placement.  The 10 mm proximal calculus was within the lower pole calyx.  An 8 mm calculus was within the midpole calyx.  5 additional calyceal calculi ranging in size from 2-4 mm were noted in upper, mid and lower pole calyces 3. Right retrograde pyelography post procedure showed no filling defects, stone fragments or contrast extravasation   EBL: Minimal  Specimens: 1. Calculus fragments for analysis   Indication: James Santiago is a 70 y.o. with recurrent stone disease and recent right ureteral stent placement for intractable renal colic secondary to a 10 mm right proximal ureteral calculus.  Pain resolved after stent placement and he has tolerated his stent without problems.  After reviewing the management options for treatment, the patient elected to proceed with the above surgical procedure(s). We have discussed the potential benefits and risks of the procedure, side effects of the proposed treatment, the likelihood of the patient achieving the goals of the procedure, and any potential problems that might occur  during the procedure or recuperation. Informed consent has been obtained.  Description of procedure:  The patient was taken to the operating room and general anesthesia was induced.  The patient was placed in the dorsal lithotomy position, prepped and draped in the usual sterile fashion, and preoperative antibiotics were administered. A preoperative time-out was performed.   A 21 French cystoscope was lubricated, passed per urethra and advanced proximally under direct vision with findings as described above  Attention was directed to the right ureteral orifice and the right ureteral stent was grasped with endoscopic forceps and brought out to the urethral meatus.  A 0.038 Sensor wire was then placed through the stent and advanced into the renal pelvis under fluoroscopic guidance without difficulty.  The stent was removed and a dual-lumen ureteral catheter was placed over the wire to the mid ureter. Retrograde pyelogram was performed and there was no filling defect in the proximal ureter at the site of the prior stone with migration to the collecting system.  A second Sensor wire was placed through the dual-lumen catheter and the catheter was removed.  A 12/14 French ureteral access sheath was placed over the working wire under fluoroscopic guidance without difficulty.  A single channel digital flexible ureteroscope was placed through the access sheath and advanced into the renal pelvis without difficulty.  Ureteropyeloscopy was performed with findings as described above.  A 242 micron holmium laser fiber was placed through the ureteroscope and the 8 mm calculus was dusted at a setting of 0.2 J and frequency of 20 hz.  2 fragments >1 mm which broke off the stone during dusting were removed with a 1.9 [place] nitinol basket.  The 10 mm lower pole calculus was dusted in a similar fashion with several fragments which broke off from the stone  again removed with a 1.9 [place] nitinol basket.  The 5 calculi  which were <5 mm in size were removed with a 1.9 [place] nitinol basket  Retrograde pyelogram was performed and each calyx was sequentially examined under fluoroscopic guidance and no significant size fragments were identified.  The ureteral access sheath and ureteroscope were removed in tandem and the ureter showed no evidence of injury or perforation.  A 6 FR/26 CM Contour ureteral stent was placed under fluoroscopic guidance.  The wire was then removed with an adequate stent curl noted in the renal pelvis as well as in the bladder.  The bladder was then emptied and the procedure ended.  The patient appeared to tolerate the procedure well and without complications.  After anesthetic reversal the patient was transported to the PACU in stable condition.   Plan:  The patient will be scheduled for cystoscopy with stent removal in ~ 1 week   John Giovanni, MD

## 2020-07-19 NOTE — Anesthesia Postprocedure Evaluation (Signed)
Anesthesia Post Note  Patient: James Santiago  Procedure(s) Performed: CYSTOSCOPY/URETEROSCOPY/HOLMIUM LASER/STENT EXCHANGE (Right )  Patient location during evaluation: PACU Anesthesia Type: General Level of consciousness: awake and alert Pain management: pain level controlled Vital Signs Assessment: post-procedure vital signs reviewed and stable Respiratory status: spontaneous breathing, nonlabored ventilation, respiratory function stable and patient connected to nasal cannula oxygen Cardiovascular status: blood pressure returned to baseline and stable Postop Assessment: no apparent nausea or vomiting Anesthetic complications: no   No complications documented.   Last Vitals:  Vitals:   07/19/20 1215 07/19/20 1225  BP: (!) 149/94 (!) 141/106  Pulse: 85 70  Resp: (!) 24 18  Temp: (!) 36.2 C (!) 36.2 C  SpO2: 97% 96%    Last Pain:  Vitals:   07/19/20 1225  TempSrc: Temporal  PainSc: 0-No pain                 Molli Barrows

## 2020-07-19 NOTE — Discharge Instructions (Signed)
DISCHARGE INSTRUCTIONS FOR KIDNEY STONE/URETERAL STENT   MEDICATIONS:  1. Resume all your other meds from home.  2.  AZO (over-the-counter) can help with the burning/stinging when you urinate.   ACTIVITY:  1. May resume regular activities in 24 hours. 2. No driving while on narcotic pain medications  3. Drink plenty of water  4. Continue to walk at home - you can still get blood clots when you are at home, so keep active, but don't over do it.  5. May return to work/school tomorrow or when you feel ready    SIGNS/SYMPTOMS TO CALL:  Common postoperative symptoms include urinary frequency, urgency, bladder spasm and blood in the urine  Please call us if you have a fever greater than 101.5, uncontrolled nausea/vomiting, uncontrolled pain, dizziness, unable to urinate, excessively bloody urine, chest pain, shortness of breath, leg swelling, leg pain, or any other concerns or questions.   You can reach [place] at [phone].   FOLLOW-UP:  1. You we will be contacted for a follow-up appointment for stent removal next week     [place]   1) The drugs that you were given will stay in your system until tomorrow so for the next 24 hours you should not:  A) Drive an automobile B) Make any legal decisions C) Drink any alcoholic beverage   2) You may resume regular meals tomorrow.  Today it is better to start with liquids and gradually work up to solid foods.  You may eat anything you prefer, but it is better to start with liquids, then soup and crackers, and gradually work up to solid foods.   3) Please notify your doctor immediately if you have any unusual bleeding, trouble breathing, redness and pain at the surgery site, drainage, fever, or pain not relieved by medication.    4) Additional Instructions:        Please contact your physician with any problems or Same Day Surgery at [phone], Monday through Friday 6 am to 4 pm, or [facility] number at [phone].

## 2020-07-24 LAB — CALCULI, WITH PHOTOGRAPH (CLINICAL LAB)
Calcium Oxalate Dihydrate: 10 %
Calcium Oxalate Monohydrate: 90 %
Weight Calculi: 48 mg

## 2020-07-27 ENCOUNTER — Encounter: Payer: Medicare Other | Admitting: Urology

## 2020-07-28 ENCOUNTER — Ambulatory Visit (INDEPENDENT_AMBULATORY_CARE_PROVIDER_SITE_OTHER): Payer: Medicare Other | Admitting: Urology

## 2020-07-28 ENCOUNTER — Encounter: Payer: Self-pay | Admitting: Urology

## 2020-07-28 ENCOUNTER — Other Ambulatory Visit: Payer: Self-pay

## 2020-07-28 VITALS — BP 101/74 | HR 91 | Ht 75.0 in | Wt 239.0 lb

## 2020-07-28 DIAGNOSIS — N200 Calculus of kidney: Secondary | ICD-10-CM | POA: Diagnosis not present

## 2020-07-28 MED ORDER — LEVOFLOXACIN 500 MG PO TABS
500.0000 mg | ORAL_TABLET | Freq: Once | ORAL | Status: AC
  Administered 2020-07-28: 500 mg via ORAL

## 2020-07-28 NOTE — Progress Notes (Signed)
Indications: Patient is 70 y.o., who is s/p ureteroscopic removal of multiple right ureteral calculi 07/19/2020.  No postop problems.  The patient is presenting today for stent removal.  Procedure:  Flexible Cystoscopy with stent removal (71855)  Timeout was performed and the correct patient, procedure and participants were identified.    Description:  The patient was prepped and draped in the usual sterile fashion. Flexible cystosopy was performed.  The stent was visualized, grasped, and removed intact without difficulty. The patient tolerated the procedure well.  A single dose of oral antibiotics was given.  Complications:  None  Plan:  Instructed to call for fever/flank pain post stent removal Stone analysis CaOxMono/CaOxDi 90/10 He states he underwent a metabolic evaluation years ago and does not desire to repeat Follow-up 6 months with [facility]   John Giovanni, MD

## 2020-07-29 LAB — URINALYSIS, COMPLETE
Bilirubin, UA: NEGATIVE
Glucose, UA: NEGATIVE
Ketones, UA: NEGATIVE
Nitrite, UA: NEGATIVE
Specific Gravity, UA: 1.025 (ref 1.005–1.030)
Urobilinogen, Ur: 0.2 mg/dL (ref 0.2–1.0)
pH, UA: 5.5 (ref 5.0–7.5)

## 2020-07-29 LAB — MICROSCOPIC EXAMINATION: RBC, Urine: >30 /hpf — AB (ref 0–2)

## 2020-08-13 ENCOUNTER — Other Ambulatory Visit: Payer: Self-pay | Admitting: Family Medicine

## 2020-09-14 ENCOUNTER — Other Ambulatory Visit: Payer: Self-pay | Admitting: Family Medicine

## 2020-09-19 ENCOUNTER — Ambulatory Visit: Payer: Medicare Other | Admitting: Family Medicine

## 2020-09-20 ENCOUNTER — Other Ambulatory Visit: Payer: Self-pay

## 2020-09-20 ENCOUNTER — Encounter: Payer: Self-pay | Admitting: Family Medicine

## 2020-09-20 ENCOUNTER — Ambulatory Visit: Payer: Medicare Other | Admitting: Family Medicine

## 2020-09-20 VITALS — BP 140/82 | HR 80 | Temp 98.6°F | Resp 16 | Ht 75.0 in | Wt 242.0 lb

## 2020-09-20 DIAGNOSIS — J329 Chronic sinusitis, unspecified: Secondary | ICD-10-CM

## 2020-09-20 DIAGNOSIS — J310 Chronic rhinitis: Secondary | ICD-10-CM

## 2020-09-20 MED ORDER — AZITHROMYCIN 250 MG PO TABS: ORAL_TABLET | ORAL | 0 refills | Status: DC

## 2020-09-20 NOTE — Progress Notes (Signed)
Subjective:    Patient ID: James Santiago, male    DOB: 1950-08-06, 70 y.o.   MRN: [id number]  HPI   [facility]  The results are listed below Patient reports a 10-day history of pain and pressure in his right maxillary and right frontal sinus.  He reports postnasal drip, thick nasal congestion, rhinorrhea, and a cough.  He reports a dull headache.  He also reports some burning and tingling in both sides of his face in the distribution of the trigeminal nerve on each side. Past Medical History:  Diagnosis Date   A-fib [facility])    a. Dx 01/2012, CHADS2 = 1.-[facility] in past.   Adenomatous colon polyp 06/2003   Arthritis    "fingers" (02/01/2012)   Atrial fibrillation ([place])    Biliary dyskinesia    Cholelithiasis    "still got 2 small stones in there" (02/01/2012)   Esophageal stricture    GERD (gastroesophageal reflux disease)    Gout    Hemorrhoids    Hiatal hernia    History of colon polyps    History of kidney stones    Hypercholesteremia    Hypertension    Kidney stone    Nephrolithiasis    "was told kidney function at 65%"- hx. kidney stones    Current Outpatient Medications on File Prior to Visit  Medication Sig Dispense Refill   allopurinol (ZYLOPRIM) 300 MG tablet TAKE 1 TABLET BY MOUTH  DAILY (Patient taking differently: Take 300 mg by mouth daily.) 90 tablet 3   amLODipine (NORVASC) 2.5 MG tablet TAKE 1 TABLET BY MOUTH  DAILY 90 tablet 3   atenolol (TENORMIN) 50 MG tablet TAKE 1 TABLET BY MOUTH  DAILY (Patient taking differently: Take 75 mg by mouth daily.) 90 tablet 3   calcium carbonate (TUMS EX) 750 MG chewable tablet Chew 1 tablet by mouth daily as needed for indigestion or heartburn.     DEXILANT 60 MG capsule TAKE 1 CAPSULE BY MOUTH  DAILY (Patient taking differently: Take 60 mg by mouth daily.) 90 capsule 3   doxazosin (CARDURA) 4 MG tablet TAKE 1 TABLET BY MOUTH  DAILY (Patient taking differently: Take 4 mg by mouth daily.) 90 tablet 3    ELIQUIS 5 MG TABS tablet TAKE 1 TABLET BY MOUTH  TWICE DAILY (Patient taking differently: Take 5 mg by mouth 2 (two) times daily. Hold 07/16/20 for surgery) 180 tablet 3   famotidine (PEPCID) 20 MG tablet Take 20 mg by mouth daily as needed for heartburn or indigestion.     oxyCODONE-acetaminophen (PERCOCET/ROXICET) 5-325 MG tablet Take 1 tablet by mouth every 6 (six) hours as needed for severe pain. 10 tablet 0   simethicone (MYLICON) 0000000 MG chewable tablet Chew 125 mg by mouth every 6 (six) hours as needed for flatulence.     simvastatin (ZOCOR) 10 MG tablet TAKE 1 TABLET BY MOUTH AT  BEDTIME (Patient taking differently: Take 10 mg by mouth at bedtime.) 90 tablet 3   telmisartan (MICARDIS) 80 MG tablet TAKE 1 TABLET BY MOUTH IN  THE MORNING (Patient taking differently: Take 80 mg by mouth daily.) 90 tablet 3   No current facility-administered medications on file prior to visit.   Allergies  Allergen Reactions   Amoxicillin Diarrhea   Augmentin [Amoxicillin-Pot Clavulanate]     Diarrhea and sick   Social History   Socioeconomic History   Marital status: Married    Spouse name: Not on file   Number of children:  1   Years of education: Not on file   Highest education level: Not on file  Occupational History    Employer: UNEMPLOYED  Tobacco Use   Smoking status: Former    Packs/day: 0.50    Years: 12.00    Pack years: 6.00    Types: Cigarettes   Smokeless tobacco: Current    Types: Chew   Tobacco comments:    02/01/2012 smoked cigarettes "probably 30 yr ago."  Currently uses 1/2 pouch of chewing tobacco daily; offered cessation materials; pt declines.  Vaping Use   Vaping Use: Never used  Substance and Sexual Activity   Alcohol use: Not Currently    Alcohol/week: 0.0 standard drinks    Comment: occ. beer   Drug use: No   Sexual activity: Not Currently  Other Topics Concern   Not on file  Social History Narrative   Lives in [place] with wife.  They have one grown child and a  grandson.  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   Social Determinants of Health   Financial Resource Strain: Not on file  Food Insecurity: Not on file  Transportation Needs: Not on file  Physical Activity: Not on file  Stress: Not on file  Social Connections: Not on file  Intimate Partner Violence: Not on file   Family History  Problem Relation Age of Onset   Lung cancer Father        died @ 17.  Also had PPM   Heart disease Father    Kidney disease Mother        died @ 22. Also had h/o CVA, breat cancer, diabetes, and PPM   Breast cancer Mother    Diabetes Mother    Heart disease Mother    Cancer Brother        esophagus   Esophageal cancer Brother    Colon cancer Neg Hx    Rectal cancer Neg Hx    Stomach cancer Neg Hx       Review of Systems     Objective:   Physical Exam Vitals reviewed.  Constitutional:      Appearance: Normal appearance. He is normal weight.  HENT:     Right Ear: Tympanic membrane and ear canal normal.     Left Ear: Tympanic membrane and ear canal normal.     Nose: No congestion or rhinorrhea.     Right Sinus: Maxillary sinus tenderness and frontal sinus tenderness present.     Mouth/Throat:     Mouth: Mucous membranes are moist.     Pharynx: Oropharynx is clear. No oropharyngeal exudate or posterior oropharyngeal erythema.  Eyes:     Extraocular Movements: Extraocular movements intact.     Conjunctiva/sclera: Conjunctivae normal.     Pupils: Pupils are equal, round, and reactive to light.  Cardiovascular:     Rate and Rhythm: Normal rate.     Heart sounds: Normal heart sounds. No murmur heard.   No friction rub. No gallop.  Pulmonary:     Effort: Pulmonary effort is normal. No respiratory distress.     Breath sounds: Normal breath sounds. No wheezing, rhonchi or rales.  Neurological:     Mental Status: He is alert.          Assessment & Plan:  Rhinosinusitis Patient appears to have bacterial  rhinosinusitis.  Begin a Z-Pak due to his penicillin allergy.  Recheck in 1 week if no better or sooner if worse.  He also  has signs and symptoms of trigeminal neuralgia however we will wait to see if the symptoms improve once we have treated the sinus infection before instituting a more thorough work-up.  The burning and tingling in his face began immediately when the sinus infection began.

## 2020-09-30 ENCOUNTER — Encounter: Payer: Self-pay | Admitting: Family Medicine

## 2020-10-04 ENCOUNTER — Ambulatory Visit: Payer: Medicare Other | Admitting: Family Medicine

## 2020-10-04 ENCOUNTER — Encounter: Payer: Self-pay | Admitting: Family Medicine

## 2020-10-04 ENCOUNTER — Other Ambulatory Visit: Payer: Self-pay

## 2020-10-04 VITALS — BP 126/72 | HR 65 | Temp 98.0°F | Ht 74.0 in | Wt 242.6 lb

## 2020-10-04 DIAGNOSIS — G629 Polyneuropathy, unspecified: Secondary | ICD-10-CM | POA: Diagnosis not present

## 2020-10-04 DIAGNOSIS — J329 Chronic sinusitis, unspecified: Secondary | ICD-10-CM

## 2020-10-04 DIAGNOSIS — R202 Paresthesia of skin: Secondary | ICD-10-CM

## 2020-10-04 DIAGNOSIS — J310 Chronic rhinitis: Secondary | ICD-10-CM | POA: Diagnosis not present

## 2020-10-04 MED ORDER — LIDOCAINE-PRILOCAINE 2.5-2.5 % EX CREA: 1.0000 "application " | TOPICAL_CREAM | CUTANEOUS | 0 refills | Status: DC | PRN

## 2020-10-04 MED ORDER — LEVOFLOXACIN 500 MG PO TABS: 500.0000 mg | ORAL_TABLET | Freq: Every day | ORAL | 0 refills | Status: AC

## 2020-10-04 MED ORDER — PREDNISONE 20 MG PO TABS: ORAL_TABLET | ORAL | 0 refills | Status: DC

## 2020-10-04 NOTE — Progress Notes (Signed)
Subjective:    Patient ID: James Santiago, male    DOB: 1950/12/17, 70 y.o.   MRN: [id number]  Please see my last office visit.  Patient states that the numbness and tingling at the tip of his upper lip and over his maxillary sinuses bilaterally have improved after the Z-Pak although it still there.  He also reports neuropathic paresthesias radiating along the second and third branch of the trigeminal nerve bilaterally from his face up to and above his ears on either side.  Gets worse throughout the day.  However he got better after taking the Z-Pak.  He continues to have a lot of pressure and drainage and postnasal drip.  He reports sinus congestion and rhinorrhea. Past Medical History:  Diagnosis Date   A-fib [facility])    a. Dx 01/2012, CHADS2 = 1.-[facility] in past.   Adenomatous colon polyp 06/2003   Arthritis    "fingers" (02/01/2012)   Atrial fibrillation ([place])    Biliary dyskinesia    Cholelithiasis    "still got 2 small stones in there" (02/01/2012)   Esophageal stricture    GERD (gastroesophageal reflux disease)    Gout    Hemorrhoids    Hiatal hernia    History of colon polyps    History of kidney stones    Hypercholesteremia    Hypertension    Kidney stone    Nephrolithiasis    "was told kidney function at 65%"- hx. kidney stones    Current Outpatient Medications on File Prior to Visit  Medication Sig Dispense Refill   allopurinol (ZYLOPRIM) 300 MG tablet TAKE 1 TABLET BY MOUTH  DAILY (Patient taking differently: Take 300 mg by mouth daily.) 90 tablet 3   amLODipine (NORVASC) 2.5 MG tablet TAKE 1 TABLET BY MOUTH  DAILY 90 tablet 3   atenolol (TENORMIN) 50 MG tablet TAKE 1 TABLET BY MOUTH  DAILY (Patient taking differently: Take 75 mg by mouth daily.) 90 tablet 3   azithromycin (ZITHROMAX) 250 MG tablet 2 tabs poqday1, 1 tab poqday 2-5 6 tablet 0   calcium carbonate (TUMS EX) 750 MG chewable tablet Chew 1 tablet by mouth daily as needed for indigestion or heartburn.      DEXILANT 60 MG capsule TAKE 1 CAPSULE BY MOUTH  DAILY (Patient taking differently: Take 60 mg by mouth daily.) 90 capsule 3   doxazosin (CARDURA) 4 MG tablet TAKE 1 TABLET BY MOUTH  DAILY (Patient taking differently: Take 4 mg by mouth daily.) 90 tablet 3   ELIQUIS 5 MG TABS tablet TAKE 1 TABLET BY MOUTH  TWICE DAILY (Patient taking differently: Take 5 mg by mouth 2 (two) times daily. Hold 07/16/20 for surgery) 180 tablet 3   famotidine (PEPCID) 20 MG tablet Take 20 mg by mouth daily as needed for heartburn or indigestion.     oxyCODONE-acetaminophen (PERCOCET/ROXICET) 5-325 MG tablet Take 1 tablet by mouth every 6 (six) hours as needed for severe pain. 10 tablet 0   simethicone (MYLICON) 0000000 MG chewable tablet Chew 125 mg by mouth every 6 (six) hours as needed for flatulence.     simvastatin (ZOCOR) 10 MG tablet TAKE 1 TABLET BY MOUTH AT  BEDTIME (Patient taking differently: Take 10 mg by mouth at bedtime.) 90 tablet 3   telmisartan (MICARDIS) 80 MG tablet TAKE 1 TABLET BY MOUTH IN  THE MORNING (Patient taking differently: Take 80 mg by mouth daily.) 90 tablet 3   No current facility-administered medications on file prior to visit.  Allergies  Allergen Reactions   Amoxicillin Diarrhea   Augmentin [Amoxicillin-Pot Clavulanate]     Diarrhea and sick   Social History   Socioeconomic History   Marital status: Married    Spouse name: Not on file   Number of children: 1   Years of education: Not on file   Highest education level: Not on file  Occupational History    Employer: UNEMPLOYED  Tobacco Use   Smoking status: Former    Packs/day: 0.50    Years: 12.00    Pack years: 6.00    Types: Cigarettes   Smokeless tobacco: Current    Types: Chew   Tobacco comments:    02/01/2012 smoked cigarettes "probably 30 yr ago."  Currently uses 1/2 pouch of chewing tobacco daily; offered cessation materials; pt declines.  Vaping Use   Vaping Use: Never used  Substance and Sexual Activity   Alcohol  use: Not Currently    Alcohol/week: 0.0 standard drinks    Comment: occ. beer   Drug use: No   Sexual activity: Not Currently  Other Topics Concern   Not on file  Social History Narrative   Lives in [place] with wife.  They have one grown child and a grandson.  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   Social Determinants of Health   Financial Resource Strain: Not on file  Food Insecurity: Not on file  Transportation Needs: Not on file  Physical Activity: Not on file  Stress: Not on file  Social Connections: Not on file  Intimate Partner Violence: Not on file   Family History  Problem Relation Age of Onset   Lung cancer Father        died @ 53.  Also had PPM   Heart disease Father    Kidney disease Mother        died @ 72. Also had h/o CVA, breat cancer, diabetes, and PPM   Breast cancer Mother    Diabetes Mother    Heart disease Mother    Cancer Brother        esophagus   Esophageal cancer Brother    Colon cancer Neg Hx    Rectal cancer Neg Hx    Stomach cancer Neg Hx       Review of Systems     Objective:   Physical Exam Vitals reviewed.  Constitutional:      Appearance: Normal appearance. He is normal weight.  HENT:     Right Ear: Tympanic membrane and ear canal normal.     Left Ear: Tympanic membrane and ear canal normal.     Nose: No congestion or rhinorrhea.     Right Sinus: Maxillary sinus tenderness and frontal sinus tenderness present.     Mouth/Throat:     Mouth: Mucous membranes are moist.     Pharynx: Oropharynx is clear. No oropharyngeal exudate or posterior oropharyngeal erythema.  Eyes:     Extraocular Movements: Extraocular movements intact.     Conjunctiva/sclera: Conjunctivae normal.     Pupils: Pupils are equal, round, and reactive to light.  Cardiovascular:     Rate and Rhythm: Normal rate.     Heart sounds: Normal heart sounds. No murmur heard.   No friction rub. No gallop.  Pulmonary:     Effort:  Pulmonary effort is normal. No respiratory distress.     Breath sounds: Normal breath sounds. No wheezing, rhonchi or rales.  Neurological:     Mental Status: He  is alert.          Assessment & Plan:  Rhinosinusitis - Plan: CT MAXILLOFACIAL W CONTRAST  Paresthesia - Plan: CT MAXILLOFACIAL W CONTRAST  Cranial neuropathy - Plan: CT MAXILLOFACIAL W CONTRAST  Patient continues to have paresthesias in the trigeminal nerve distribution bilaterally.  However this is very unusual given that it occurred at the exact same time he developed a sinus infection and it also improved after taking an antibiotic although it did not completely subside.  This would be very atypical for trigeminal neuralgia.  Therefore I believe that this is more likely an atypical presentation of a sinus infection particular given the fact he continues to have postnasal drip and drainage and pressure.  Therefore [place] try 1 additional time with Levaquin and a prednisone taper pack.  If symptoms do not improve after this I would obtain a CT scan of the sinuses to evaluate for potential causes of bilateral facial paresthesias

## 2020-10-05 ENCOUNTER — Telehealth: Payer: Self-pay | Admitting: *Deleted

## 2020-10-05 NOTE — Telephone Encounter (Signed)
Received call from patient.   Requested to have imaging ordered in [place].

## 2020-10-08 ENCOUNTER — Encounter: Payer: Self-pay | Admitting: Family Medicine

## 2020-10-19 IMAGING — CR DG KNEE COMPLETE 4+V*R*
1 series · 4 of 4 positions shown · non-contrast
Comparison: None.

CLINICAL DATA: Medial knee pain

EXAM:
RIGHT KNEE - COMPLETE 4+ VIEW

[Series 1: dg knee complete 4 views right · 0.14mm/px · 4 of 4 slices shown]
[im 1/4]
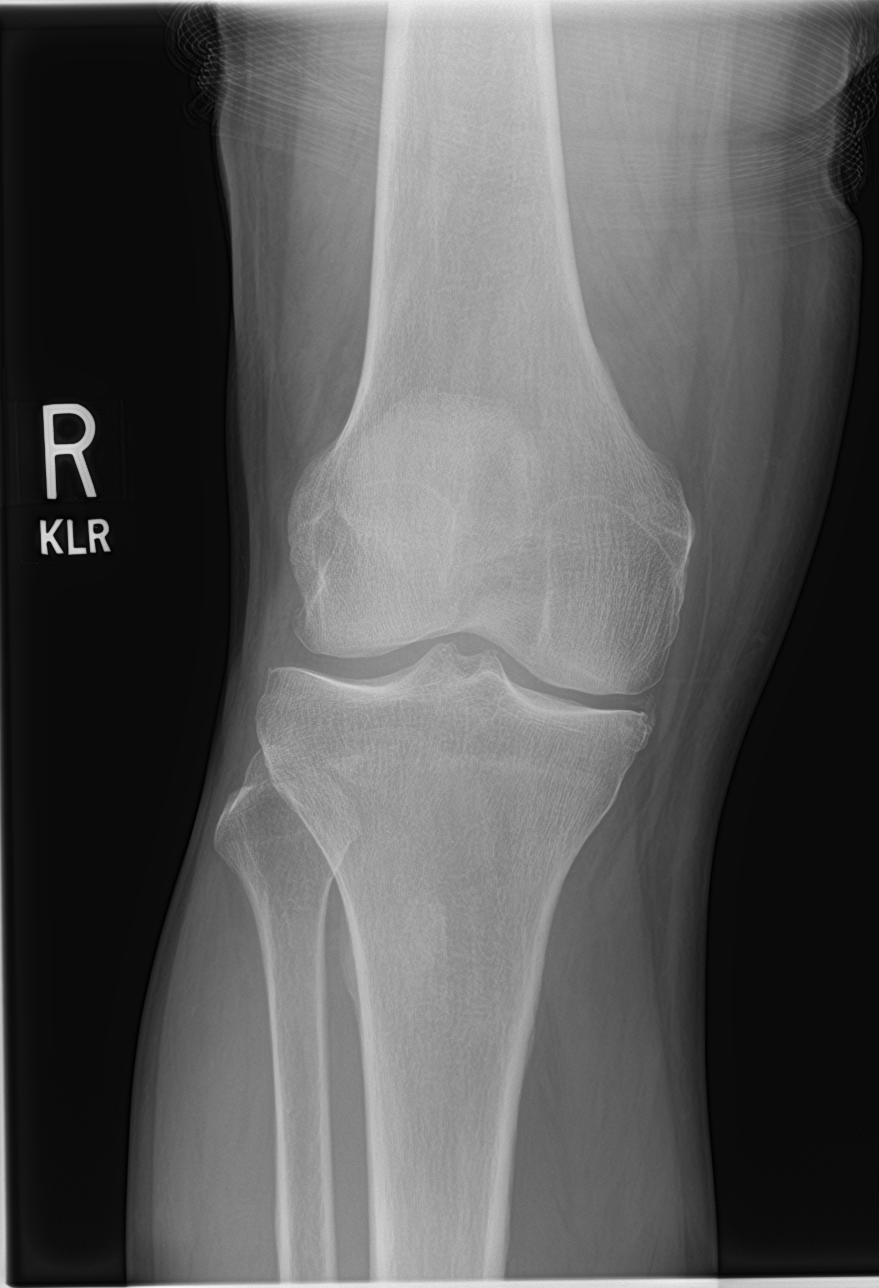
[im 2/4]
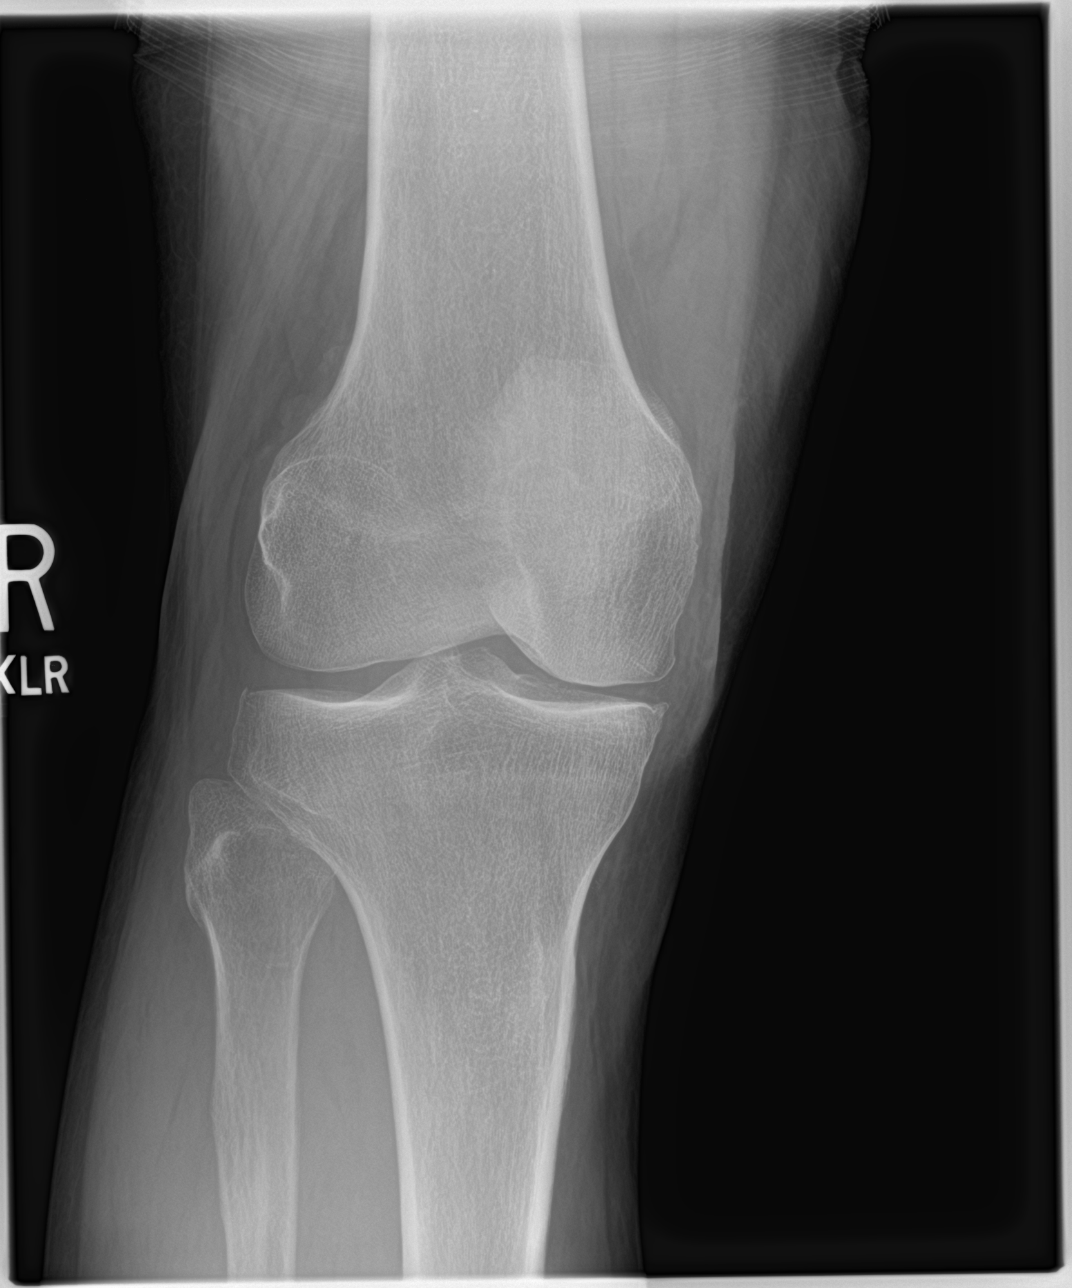
[im 3/4]
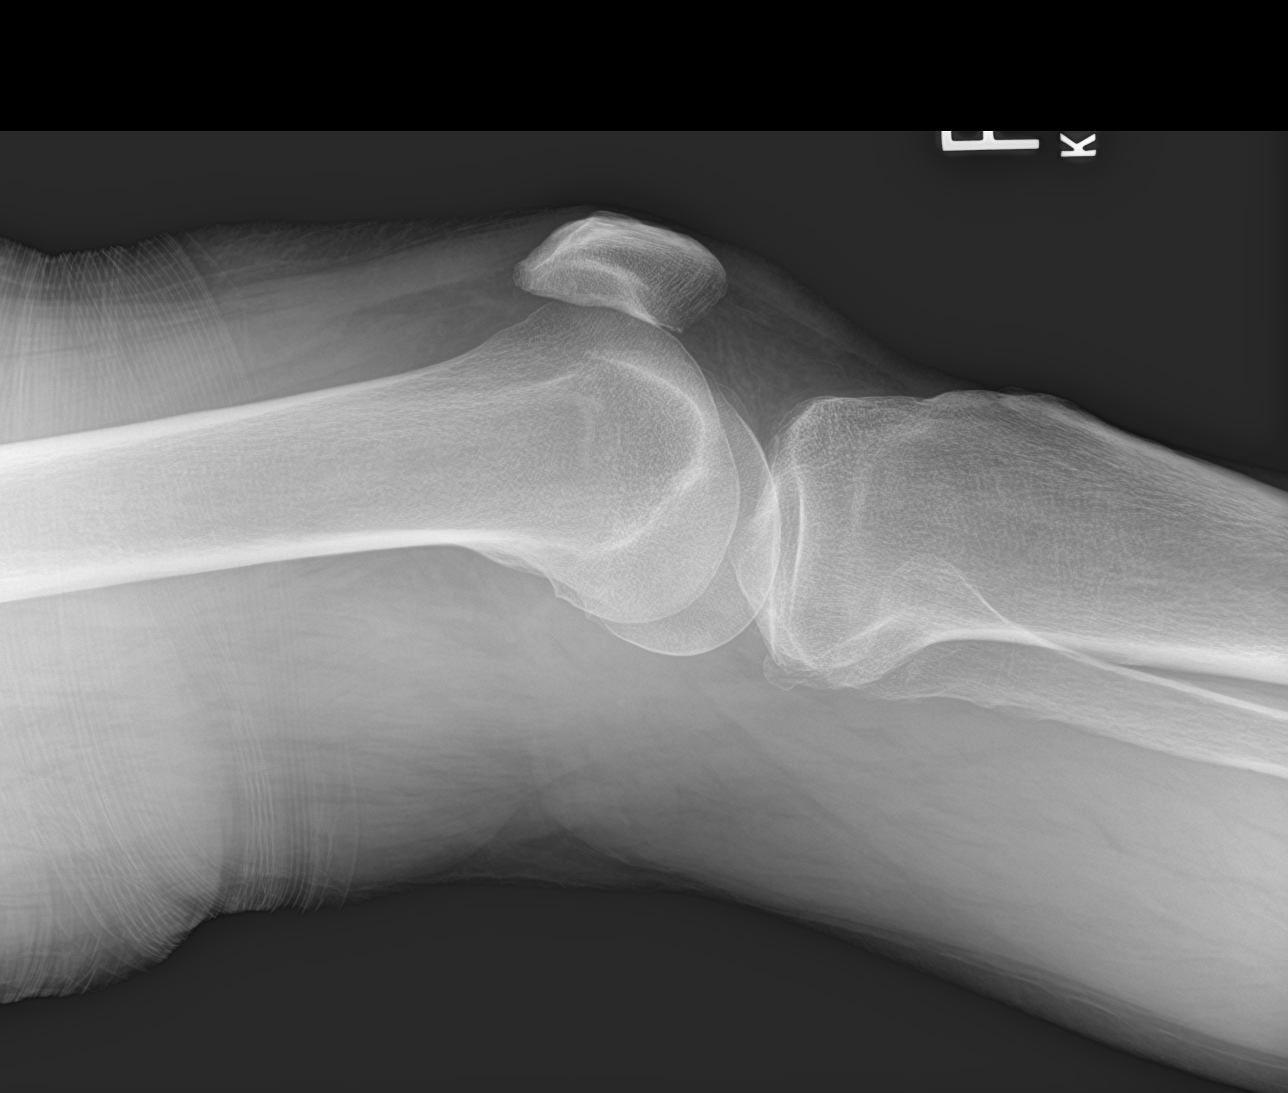
[im 4/4]
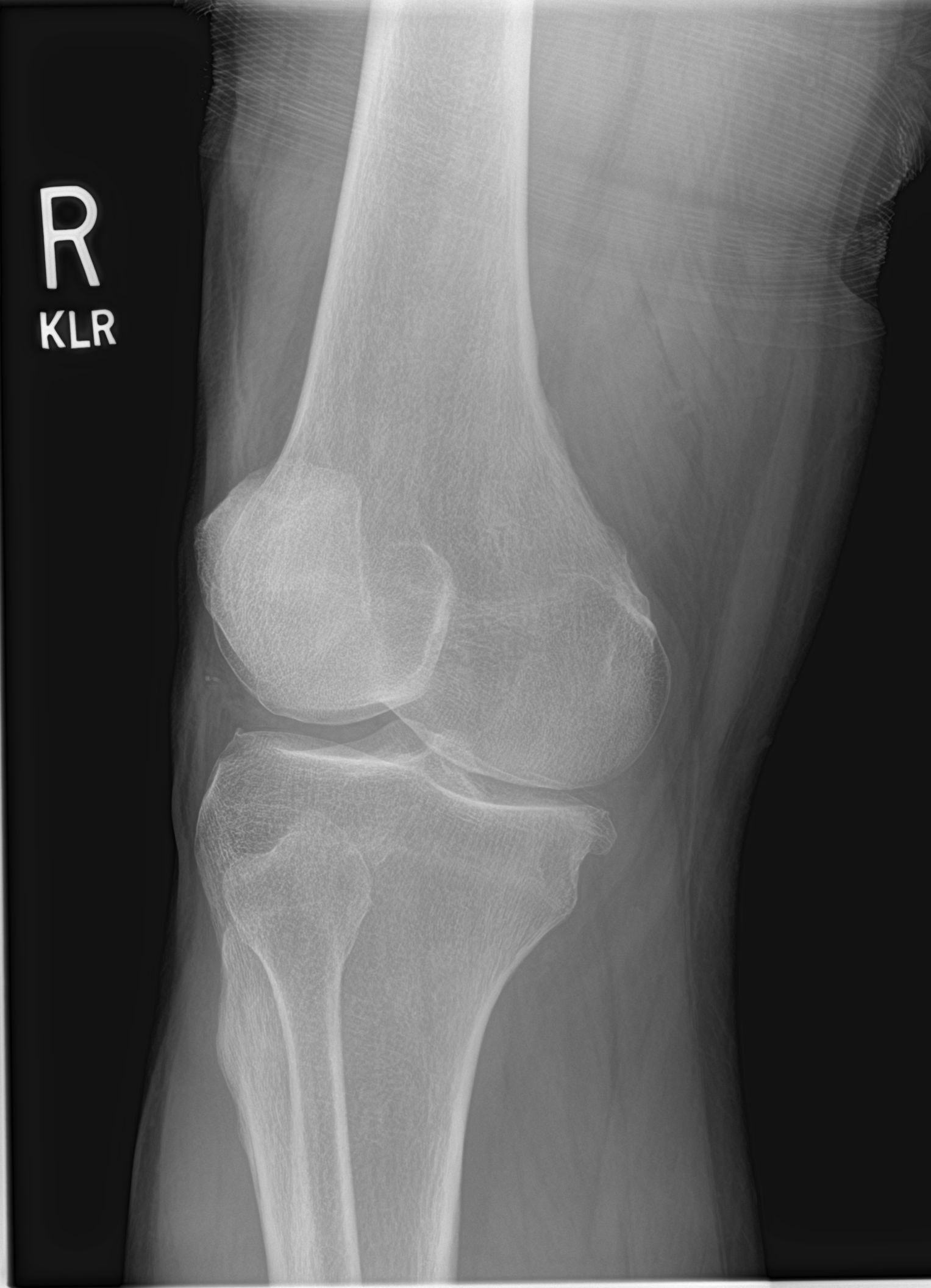

[4 of 4 positions shown; findings below may reference images not displayed]

FINDINGS: No fracture or malalignment. Mild patellofemoral and medial joint
space degenerative change. Mild medial joint space calcification. No
large knee effusion
IMPRESSION: 1. Mild arthritis involving the medial and patellofemoral
compartments. No acute osseous abnormality

## 2020-10-21 ENCOUNTER — Other Ambulatory Visit: Payer: Self-pay | Admitting: Family Medicine

## 2020-10-28 ENCOUNTER — Other Ambulatory Visit: Payer: Self-pay | Admitting: Family Medicine

## 2020-10-31 ENCOUNTER — Ambulatory Visit
Admission: RE | Admit: 2020-10-31 | Discharge: 2020-10-31 | Disposition: A | Discharge status: Home / Self Care | Payer: Medicare Other | Source: Ambulatory Visit | Attending: Family Medicine | Admitting: Family Medicine

## 2020-10-31 ENCOUNTER — Telehealth: Payer: Self-pay | Admitting: Family Medicine

## 2020-10-31 ENCOUNTER — Other Ambulatory Visit: Payer: Self-pay

## 2020-10-31 DIAGNOSIS — G629 Polyneuropathy, unspecified: Secondary | ICD-10-CM | POA: Insufficient documentation

## 2020-10-31 DIAGNOSIS — J310 Chronic rhinitis: Secondary | ICD-10-CM | POA: Insufficient documentation

## 2020-10-31 DIAGNOSIS — J329 Chronic sinusitis, unspecified: Secondary | ICD-10-CM | POA: Diagnosis present

## 2020-10-31 DIAGNOSIS — R202 Paresthesia of skin: Secondary | ICD-10-CM | POA: Diagnosis present

## 2020-10-31 LAB — POCT I-STAT CREATININE: Creatinine, Ser: 1.5 mg/dL — ABNORMAL HIGH (ref 0.61–1.24)

## 2020-10-31 MED ORDER — IOHEXOL 350 MG/ML SOLN
75.0000 mL | Freq: Once | INTRAVENOUS | Status: AC | PRN
  Administered 2020-10-31: 60 mL via INTRAVENOUS

## 2020-10-31 NOTE — Telephone Encounter (Signed)
Left message for patient to call back and schedule Medicare Annual Wellness Visit (AWV) in office.  ° °If not able to come in office, please offer to do virtually or by telephone.  Left office number and my jabber #[phone]. ° °Due for AWVI ° °Please schedule at anytime with Nurse Health Advisor. °  °

## 2020-11-13 ENCOUNTER — Other Ambulatory Visit: Payer: Self-pay | Admitting: Family Medicine

## 2021-01-25 ENCOUNTER — Other Ambulatory Visit: Payer: Self-pay

## 2021-01-25 ENCOUNTER — Ambulatory Visit
Admission: RE | Admit: 2021-01-25 | Discharge: 2021-01-25 | Disposition: A | Discharge status: Home / Self Care | Payer: Medicare Other | Attending: Urology | Admitting: Urology

## 2021-01-25 ENCOUNTER — Ambulatory Visit
Admission: RE | Admit: 2021-01-25 | Discharge: 2021-01-25 | Disposition: A | Discharge status: Home / Self Care | Payer: Medicare Other | Source: Ambulatory Visit | Attending: Urology | Admitting: Urology

## 2021-01-25 ENCOUNTER — Ambulatory Visit: Payer: Medicare Other | Admitting: Urology

## 2021-01-25 ENCOUNTER — Encounter: Payer: Self-pay | Admitting: Urology

## 2021-01-25 VITALS — BP 120/79 | HR 76 | Ht 74.0 in | Wt 240.0 lb

## 2021-01-25 DIAGNOSIS — N200 Calculus of kidney: Secondary | ICD-10-CM | POA: Insufficient documentation

## 2021-01-25 NOTE — Progress Notes (Signed)
01/25/2021 2:25 PM   James Santiago November 27, 1950 [id number]  Referring provider: Susy Frizzle, MD [address] [place],  [place] [address]  Chief Complaint  Patient presents with   Nephrolithiasis    Urologic history:  1.  Recurrent nephrolithiasis Prior SWL and ureteroscopy in the remote past Ureteroscopic removal of a 10 mm right proximal ureteral calculus and multiple right renal calculi 07/19/2020 Stone analysis CaOxMono/CaOxDi 50/35 Previous metabolic evaluation performed but results unknown; declined repeat Multiple nonobstructing left renal calculi   HPI: 70 y.o. male presents for 78-month follow-up.  Doing well since last visit No bothersome LUTS Denies dysuria, gross hematuria Denies flank, abdominal or pelvic pain  PMH: Past Medical History:  Diagnosis Date   A-fib ([place])    a. Dx 01/2012, CHADS2 = 1.-[facility] in past.   Adenomatous colon polyp 06/2003   Arthritis    "fingers" (02/01/2012)   Atrial fibrillation ([place])    Biliary dyskinesia    Cholelithiasis    "still got 2 small stones in there" (02/01/2012)   Esophageal stricture    GERD (gastroesophageal reflux disease)    Gout    Hemorrhoids    Hiatal hernia    History of colon polyps    History of kidney stones    Hypercholesteremia    Hypertension    Kidney stone    Nephrolithiasis    "was told kidney function at 65%"- hx. kidney stones    Surgical History: Past Surgical History:  Procedure Laterality Date   CARDIOVERSION  03/06/2012   Procedure: CARDIOVERSION;  Surgeon: Larey Dresser, MD;  Location: [facility];  Service: Cardiovascular;  Laterality: N/A;   CHOLECYSTECTOMY N/A 11/13/2012   Procedure: LAPAROSCOPIC CHOLECYSTECTOMY;  Surgeon: Rolm Bookbinder, MD;  Location: [facility];  Service: General;  Laterality: N/A;   COLONOSCOPY     CYSTOSCOPY W/ URETEROSCOPY W/ LITHOTRIPSY  1980's   CYSTOSCOPY WITH RETROGRADE PYELOGRAM, URETEROSCOPY AND STENT PLACEMENT  1980's    CYSTOSCOPY/URETEROSCOPY/HOLMIUM LASER/STENT PLACEMENT Right 07/06/2020   Procedure: CYSTOSCOPY/URETEROSCOPY/STENT PLACEMENT;  Surgeon: Abbie Sons, MD;  Location: [facility];  Service: Urology;  Laterality: Right;   CYSTOSCOPY/URETEROSCOPY/HOLMIUM LASER/STENT PLACEMENT Right 07/19/2020   Procedure: CYSTOSCOPY/URETEROSCOPY/HOLMIUM LASER/STENT EXCHANGE;  Surgeon: Abbie Sons, MD;  Location: [facility];  Service: Urology;  Laterality: Right;   ESOPHAGEAL DILATION     INGUINAL HERNIA REPAIR  ? 2008   "right" (02/01/2012)   POLYPECTOMY     VASECTOMY      Home Medications:  Allergies as of 01/25/2021       Reactions   Amoxicillin Diarrhea   Augmentin [amoxicillin-pot Clavulanate]    Diarrhea and sick        Medication List        Accurate as of January 25, 2021  2:25 PM. If you have any questions, ask your nurse or doctor.          allopurinol 300 MG tablet Commonly known as: ZYLOPRIM TAKE 1 TABLET BY MOUTH  DAILY   amLODipine 2.5 MG tablet Commonly known as: NORVASC TAKE 1 TABLET BY MOUTH  DAILY   atenolol 50 MG tablet Commonly known as: TENORMIN TAKE 1 TABLET BY MOUTH  DAILY What changed: how much to take   azithromycin 250 MG tablet Commonly known as: ZITHROMAX 2 tabs poqday1, 1 tab poqday 2-5   calcium carbonate 750 MG chewable tablet Commonly known as: TUMS EX Chew 1 tablet by mouth daily as needed for indigestion or heartburn.   Dexilant 60 MG  capsule Generic drug: dexlansoprazole TAKE 1 CAPSULE BY MOUTH  DAILY What changed: how much to take   doxazosin 4 MG tablet Commonly known as: CARDURA TAKE 1 TABLET BY MOUTH  DAILY   Eliquis 5 MG Tabs tablet Generic drug: apixaban TAKE 1 TABLET BY MOUTH  TWICE DAILY What changed:  how much to take additional instructions   famotidine 20 MG tablet Commonly known as: PEPCID Take 20 mg by mouth daily as needed for heartburn or indigestion.   lidocaine-prilocaine cream Commonly known as: EMLA Apply 1  application topically as needed.   oxyCODONE-acetaminophen 5-325 MG tablet Commonly known as: PERCOCET/ROXICET Take 1 tablet by mouth every 6 (six) hours as needed for severe pain.   predniSONE 20 MG tablet Commonly known as: DELTASONE 3 tabs poqday 1-2, 2 tabs poqday 3-4, 1 tab poqday 5-6   simethicone 125 MG chewable tablet Commonly known as: MYLICON Chew 381 mg by mouth every 6 (six) hours as needed for flatulence.   simvastatin 10 MG tablet Commonly known as: ZOCOR TAKE 1 TABLET BY MOUTH AT  BEDTIME   telmisartan 80 MG tablet Commonly known as: MICARDIS TAKE 1 TABLET BY MOUTH IN  THE MORNING        Allergies:  Allergies  Allergen Reactions   Amoxicillin Diarrhea   Augmentin [Amoxicillin-Pot Clavulanate]     Diarrhea and sick    Family History: Family History  Problem Relation Age of Onset   Lung cancer Father        died @ 25.  Also had PPM   Heart disease Father    Kidney disease Mother        died @ 24. Also had h/o CVA, breat cancer, diabetes, and PPM   Breast cancer Mother    Diabetes Mother    Heart disease Mother    Cancer Brother        esophagus   Esophageal cancer Brother    Colon cancer Neg Hx    Rectal cancer Neg Hx    Stomach cancer Neg Hx     Social History:  reports that he has quit smoking. His smoking use included cigarettes. He has a 6.00 pack-year smoking history. His smokeless tobacco use includes chew. He reports that he does not currently use alcohol. He reports that he does not use drugs.   Physical Exam: BP 120/79    Pulse 76    Ht 6\' 2"  (1.88 m)    Wt 240 lb (108.9 kg)    BMI 30.81 kg/m   Constitutional:  Alert and oriented, No acute distress. HEENT: [place] AT, moist mucus membranes.  Trachea midline, no masses. Cardiovascular: No clubbing, cyanosis, or edema. Respiratory: Normal respiratory effort, no increased work of breathing. Psychiatric: Normal mood and affect.   Pertinent Imaging: KUB images reviewed and interpreted on  today's x-ray.  Moderate amount of stool and bowel gas overlying renal outlines.  Multiple left renal calculi.  No right-sided calcifications identified   Assessment & Plan:    1.  Nephrolithiasis Multiple left renal calculi; nonobstructing KUB only 6 months- will call with results  Office visit 1 year with KUB prior Call earlier for left flank pain/renal colic   Abbie Sons, MD  [facility] [address] [place], [place] [address] [phone]

## 2021-01-26 ENCOUNTER — Other Ambulatory Visit: Payer: Self-pay | Admitting: Family Medicine

## 2021-01-27 ENCOUNTER — Other Ambulatory Visit: Payer: Self-pay | Admitting: Family Medicine

## 2021-02-13 ENCOUNTER — Encounter: Payer: Self-pay | Admitting: Family Medicine

## 2021-02-14 ENCOUNTER — Telehealth: Payer: Self-pay

## 2021-02-14 MED ORDER — DEXLANSOPRAZOLE 60 MG PO CPDR: 1.0000 | DELAYED_RELEASE_CAPSULE | Freq: Every day | ORAL | 3 refills | Status: DC

## 2021-02-14 NOTE — Telephone Encounter (Signed)
[facility] is out of dexilant.  Requesting alternative:omeprazol, pantoprazole or esomeprazole.  Please advise.

## 2021-02-15 MED ORDER — PANTOPRAZOLE SODIUM 40 MG PO TBEC: 40.0000 mg | DELAYED_RELEASE_TABLET | Freq: Every day | ORAL | 3 refills | Status: DC

## 2021-02-15 NOTE — Telephone Encounter (Signed)
Per Dr Dennard Schaumann Could switch to protonix 40 mg poqday. New rx sent.

## 2021-02-24 ENCOUNTER — Encounter: Payer: Self-pay | Admitting: Family Medicine

## 2021-02-24 ENCOUNTER — Telehealth: Payer: Medicare Other | Admitting: Physician Assistant

## 2021-02-24 DIAGNOSIS — J019 Acute sinusitis, unspecified: Secondary | ICD-10-CM

## 2021-02-24 DIAGNOSIS — R051 Acute cough: Secondary | ICD-10-CM | POA: Diagnosis not present

## 2021-02-24 DIAGNOSIS — B9689 Other specified bacterial agents as the cause of diseases classified elsewhere: Secondary | ICD-10-CM

## 2021-02-24 MED ORDER — BENZONATATE 100 MG PO CAPS: 100.0000 mg | ORAL_CAPSULE | Freq: Three times a day (TID) | ORAL | 0 refills | Status: DC | PRN

## 2021-02-24 MED ORDER — DOXYCYCLINE HYCLATE 100 MG PO TABS: 100.0000 mg | ORAL_TABLET | Freq: Two times a day (BID) | ORAL | 0 refills | Status: DC

## 2021-02-24 NOTE — Progress Notes (Signed)
E-Visit for Sinus Problems  We are sorry that you are not feeling well.  Here is how we plan to help!  Based on what you have shared with me it looks like you have sinusitis.  Sinusitis is inflammation and infection in the sinus cavities of the head.  Based on your presentation I believe you most likely have Acute Bacterial Sinusitis.  This is an infection caused by bacteria and is treated with antibiotics. I have prescribed Doxycycline 100mg  by mouth twice a day for 10 days. I will also prescribe Tessalon perles for the cough. You may use an oral decongestant such as Mucinex D or if you have glaucoma or high blood pressure use plain Mucinex. Saline nasal spray help and can safely be used as often as needed for congestion.  If you develop worsening sinus pain, fever or notice severe headache and vision changes, or if symptoms are not better after completion of antibiotic, please schedule an appointment with a health care provider.    Sinus infections are not as easily transmitted as other respiratory infection, however we still recommend that you avoid close contact with loved ones, especially the very young and elderly.  Remember to wash your hands thoroughly throughout the day as this is the number one way to prevent the spread of infection!  Home Care: Only take medications as instructed by your medical team. Complete the entire course of an antibiotic. Do not take these medications with alcohol. A steam or ultrasonic humidifier can help congestion.  You can place a towel over your head and breathe in the steam from hot water coming from a faucet. Avoid close contacts especially the very young and the elderly. Cover your mouth when you cough or sneeze. Always remember to wash your hands.  Get Help Right Away If: You develop worsening fever or sinus pain. You develop a severe head ache or visual changes. Your symptoms persist after you have completed your treatment plan.  Make sure  you Understand these instructions. Will watch your condition. Will get help right away if you are not doing well or get worse.  Thank you for choosing an e-visit.  Your e-visit answers were reviewed by a board certified advanced clinical practitioner to complete your personal care plan. Depending upon the condition, your plan could have included both over the counter or prescription medications.  Please review your pharmacy choice. Make sure the pharmacy is open so you can pick up prescription now. If there is a problem, you may contact your provider through [employer] and have the prescription routed to another pharmacy.  Your safety is important to [place]. If you have drug allergies check your prescription carefully.   For the next 24 hours you can use MyChart to ask questions about today's visit, request a non-urgent call back, or ask for a work or school excuse. You will get an email in the next two days asking about your experience. I hope that your e-visit has been valuable and will speed your recovery.  I provided 5 minutes of non face-to-face time during this encounter for chart review and documentation.

## 2021-02-28 ENCOUNTER — Ambulatory Visit: Payer: Medicare Other | Admitting: Family Medicine

## 2021-03-23 ENCOUNTER — Ambulatory Visit: Payer: Medicare Other | Admitting: Family Medicine

## 2021-03-23 ENCOUNTER — Other Ambulatory Visit: Payer: Self-pay

## 2021-03-23 ENCOUNTER — Encounter: Payer: Self-pay | Admitting: Family Medicine

## 2021-03-23 VITALS — BP 142/98 | HR 78 | Temp 97.9°F | Resp 18 | Ht 74.0 in | Wt 240.0 lb

## 2021-03-23 DIAGNOSIS — J411 Mucopurulent chronic bronchitis: Secondary | ICD-10-CM

## 2021-03-23 MED ORDER — FLUTICASONE PROPIONATE 50 MCG/ACT NA SUSP: 2.0000 | Freq: Every day | NASAL | 6 refills | Status: DC

## 2021-03-23 MED ORDER — LEVOFLOXACIN 500 MG PO TABS: 500.0000 mg | ORAL_TABLET | Freq: Every day | ORAL | 0 refills | Status: AC

## 2021-03-23 NOTE — Progress Notes (Signed)
Subjective:    Patient ID: James Santiago, male    DOB: April 22, 1950, 71 y.o.   MRN: [id number] Please see my last office visit in August.  After the patient completed the Levaquin and the prednisone, the sinus pain, postnasal drip, and cough going away.  He states that he did well until the end of December.  At that time he caught a cold from his grandchildren.  However he has had sinus pain and pressure and postnasal drip and a barking cough productive of green and brown sputum for the last 2 months.  He has been treated with doxycycline and Tessalon Perles helped some.  He does have a 15-pack-year history of smoking but he quit smoking at 34.  Today on his exam he does have diminished breath sounds throughout with faint expiratory wheezing raising the concern about possible underlying COPD Past Medical History:  Diagnosis Date   A-fib ([place])    a. Dx 01/2012, CHADS2 = 1.-[facility] in past.   Adenomatous colon polyp 06/2003   Arthritis    "fingers" (02/01/2012)   Atrial fibrillation ([place])    Biliary dyskinesia    Cholelithiasis    "still got 2 small stones in there" (02/01/2012)   Esophageal stricture    GERD (gastroesophageal reflux disease)    Gout    Hemorrhoids    Hiatal hernia    History of colon polyps    History of kidney stones    Hypercholesteremia    Hypertension    Kidney stone    Nephrolithiasis    "was told kidney function at 65%"- hx. kidney stones    Current Outpatient Medications on File Prior to Visit  Medication Sig Dispense Refill   allopurinol (ZYLOPRIM) 300 MG tablet TAKE 1 TABLET BY MOUTH  DAILY 90 tablet 3   amLODipine (NORVASC) 2.5 MG tablet TAKE 1 TABLET BY MOUTH  DAILY 90 tablet 3   atenolol (TENORMIN) 50 MG tablet TAKE 1 TABLET BY MOUTH  DAILY (Patient taking differently: Take 75 mg by mouth daily.) 90 tablet 3   calcium carbonate (TUMS EX) 750 MG chewable tablet Chew 1 tablet by mouth daily as needed for indigestion or heartburn.     famotidine (PEPCID)  20 MG tablet Take 20 mg by mouth daily as needed for heartburn or indigestion.     lidocaine-prilocaine (EMLA) cream Apply 1 application topically as needed. 30 g 0   pantoprazole (PROTONIX) 40 MG tablet Take 1 tablet (40 mg total) by mouth daily. 90 tablet 3   simethicone (MYLICON) 641 MG chewable tablet Chew 125 mg by mouth every 6 (six) hours as needed for flatulence.     simvastatin (ZOCOR) 10 MG tablet TAKE 1 TABLET BY MOUTH AT  BEDTIME 90 tablet 3   telmisartan (MICARDIS) 80 MG tablet TAKE 1 TABLET BY MOUTH IN  THE MORNING 90 tablet 3   doxazosin (CARDURA) 4 MG tablet TAKE 1 TABLET BY MOUTH  DAILY (Patient not taking: Reported on 03/23/2021) 90 tablet 3   ELIQUIS 5 MG TABS tablet TAKE 1 TABLET BY MOUTH  TWICE DAILY (Patient not taking: Reported on 03/23/2021) 180 tablet 3   No current facility-administered medications on file prior to visit.   Allergies  Allergen Reactions   Amoxicillin Diarrhea   Augmentin [Amoxicillin-Pot Clavulanate]     Diarrhea and sick   Social History   Socioeconomic History   Marital status: Married    Spouse name: Not on file   Number of children: 1   Years  of education: Not on file   Highest education level: Not on file  Occupational History    Employer: UNEMPLOYED  Tobacco Use   Smoking status: Former    Packs/day: 0.50    Years: 12.00    Pack years: 6.00    Types: Cigarettes   Smokeless tobacco: Current    Types: Chew   Tobacco comments:    02/01/2012 smoked cigarettes "probably 30 yr ago."  Currently uses 1/2 pouch of chewing tobacco daily; offered cessation materials; pt declines.  Vaping Use   Vaping Use: Never used  Substance and Sexual Activity   Alcohol use: Not Currently    Alcohol/week: 0.0 standard drinks    Comment: occ. beer   Drug use: No   Sexual activity: Not Currently  Other Topics Concern   Not on file  Social History Narrative   Lives in [place] with wife.  They have one grown child and a grandson.  He works for the city of  [employer], doing maintenance in Passenger transport manager.   Social Determinants of Health   Financial Resource Strain: Not on file  Food Insecurity: Not on file  Transportation Needs: Not on file  Physical Activity: Not on file  Stress: Not on file  Social Connections: Not on file  Intimate Partner Violence: Not on file   Family History  Problem Relation Age of Onset   Lung cancer Father        died @ 57.  Also had PPM   Heart disease Father    Kidney disease Mother        died @ 76. Also had h/o CVA, breat cancer, diabetes, and PPM   Breast cancer Mother    Diabetes Mother    Heart disease Mother    Cancer Brother        esophagus   Esophageal cancer Brother    Colon cancer Neg Hx    Rectal cancer Neg Hx    Stomach cancer Neg Hx       Review of Systems     Objective:   Physical Exam Vitals reviewed.  Constitutional:      Appearance: Normal appearance. He is normal weight.  HENT:     Right Ear: Tympanic membrane and ear canal normal.     Left Ear: Tympanic membrane and ear canal normal.     Nose: Congestion and rhinorrhea present.     Mouth/Throat:     Mouth: Mucous membranes are moist.     Pharynx: Oropharynx is clear. No oropharyngeal exudate or posterior oropharyngeal erythema.  Eyes:     Extraocular Movements: Extraocular movements intact.     Conjunctiva/sclera: Conjunctivae normal.     Pupils: Pupils are equal, round, and reactive to light.  Cardiovascular:     Rate and Rhythm: Normal rate.     Heart sounds: Normal heart sounds. No murmur heard.   No friction rub. No gallop.  Pulmonary:     Effort: Pulmonary effort is normal. No respiratory distress.     Breath sounds: Decreased air movement present. Decreased breath sounds and wheezing present. No rhonchi or rales.  Neurological:     Mental Status: He is alert.          Assessment & Plan:  Mucopurulent chronic bronchitis (HCC) Cough has gone on now for 2 months.  He has a purulent brown sputum.  He has  diminished breath sounds and expiratory wheezing.  I will treat the patient for chronic bronchitis with Levaquin coupled with Flonase for  postnasal drip and chronic sinusitis.  Reassess in 2 weeks.  If no better I would recommend pulmonary function test to evaluate for underlying COPD.

## 2021-05-09 ENCOUNTER — Other Ambulatory Visit: Payer: Self-pay | Admitting: Family Medicine

## 2021-06-01 ENCOUNTER — Ambulatory Visit (INDEPENDENT_AMBULATORY_CARE_PROVIDER_SITE_OTHER): Payer: Medicare Other

## 2021-06-01 VITALS — Ht 74.0 in | Wt 240.0 lb

## 2021-06-01 DIAGNOSIS — Z0000 Encounter for general adult medical examination without abnormal findings: Secondary | ICD-10-CM | POA: Diagnosis not present

## 2021-06-01 NOTE — Progress Notes (Signed)
? ?Subjective:  ? James Santiago is a 71 y.o. male who presents for Medicare Annual/Subsequent preventive examination. ?Virtual Visit via Telephone Note ? ?I connected with  James Santiago on 06/01/21 at  8:15 AM EDT by telephone and verified that I am speaking with the correct person using two identifiers. ? ?Location: ?Patient: HOME ?Provider: [facility] ?Persons participating in the virtual visit: patient/Nurse Health Advisor ?  ?I discussed the limitations, risks, security and privacy concerns of performing an evaluation and management service by telephone and the availability of in person appointments. The patient expressed understanding and agreed to proceed. ? ?Interactive audio and video telecommunications were attempted between this nurse and patient, however failed, due to patient having technical difficulties OR patient did not have access to video capability.  We continued and completed visit with audio only. ? ?Some vital signs may be absent or patient reported.  ? ?James Driver, LPN ? ?Review of Systems    ? ? ?Cardiac Risk Factors include: advanced age (>41mn, >>78women);hypertension;dyslipidemia;male gender;sedentary lifestyle;obesity (BMI >30kg/m2) ? ?   ?Objective:  ?  ?Today's Vitals  ? 06/01/21 0816  ?Weight: 240 lb (108.9 kg)  ?Height: '6\' 2"'$  (1.88 m)  ? ?Body mass index is 30.81 kg/m?. ? ? ?  06/01/2021  ?  8:24 AM 07/19/2020  ?  7:27 AM 07/08/2020  ?  2:49 PM 07/06/2020  ? 10:43 AM 07/06/2020  ? 12:57 AM 07/05/2020  ?  7:30 PM 05/26/2020  ?  9:29 AM  ?Advanced Directives  ?Does Patient Have a Medical Advance Directive? Yes No Yes No  No Yes  ?Type of [place]      [place] will  ?Does patient want to make changes to medical advance directive?   No - Patient declined    No - Patient declined  ?Copy of [place] Chart? No - copy requested      No - copy requested  ?Would patient like information on  creating a medical advance directive?  No - Patient declined No - Patient declined No - Patient declined No - Patient declined    ? ? ?Current Medications (verified) ?Outpatient Encounter Medications as of 06/01/2021  ?Medication Sig  ? allopurinol (ZYLOPRIM) 300 MG tablet TAKE 1 TABLET BY MOUTH  DAILY  ? amLODipine (NORVASC) 2.5 MG tablet TAKE 1 TABLET BY MOUTH EVERY DAY  ? atenolol (TENORMIN) 50 MG tablet TAKE 1 TABLET BY MOUTH  DAILY (Patient taking differently: Take 75 mg by mouth daily.)  ? calcium carbonate (TUMS EX) 750 MG chewable tablet Chew 1 tablet by mouth daily as needed for indigestion or heartburn.  ? dexlansoprazole (DEXILANT) 60 MG capsule Take 1 capsule by mouth daily.  ? doxazosin (CARDURA) 4 MG tablet TAKE 1 TABLET BY MOUTH  DAILY  ? ELIQUIS 5 MG TABS tablet TAKE 1 TABLET BY MOUTH  TWICE DAILY  ? famotidine (PEPCID) 20 MG tablet Take 20 mg by mouth daily as needed for heartburn or indigestion.  ? fluticasone (FLONASE) 50 MCG/ACT nasal spray Place 2 sprays into both nostrils daily.  ? lidocaine-prilocaine (EMLA) cream Apply 1 application topically as needed.  ? simethicone (MYLICON) 1497MG chewable tablet Chew 125 mg by mouth every 6 (six) hours as needed for flatulence.  ? simvastatin (ZOCOR) 10 MG tablet TAKE 1 TABLET BY MOUTH AT  BEDTIME  ? telmisartan (MICARDIS) 80 MG tablet TAKE 1 TABLET BY MOUTH IN  THE MORNING  ? pantoprazole (PROTONIX) 40 MG tablet Take 1 tablet (40 mg total) by mouth daily. (Patient not taking: Reported on 06/01/2021)  ? ?No facility-administered encounter medications on file as of 06/01/2021.  ? ? ?Allergies (verified) ?Amoxicillin and Augmentin [amoxicillin-pot clavulanate]  ? ?History: ?Past Medical History:  ?Diagnosis Date  ? A-fib ([place])   ? a. Dx 01/2012, CHADS2 = 1.-[facility] in past.  ? Adenomatous colon polyp 06/2003  ? Arthritis   ? "fingers" (02/01/2012)  ? Atrial fibrillation ([place])   ? Biliary dyskinesia   ? Cholelithiasis   ? "still got 2 small stones in there"  (02/01/2012)  ? Esophageal stricture   ? GERD (gastroesophageal reflux disease)   ? Gout   ? Hemorrhoids   ? Hiatal hernia   ? History of colon polyps   ? History of kidney stones   ? Hypercholesteremia   ? Hypertension   ? Kidney stone   ? Nephrolithiasis   ? "was told kidney function at 65%"- hx. kidney stones  ? ?Past Surgical History:  ?Procedure Laterality Date  ? CARDIOVERSION  03/06/2012  ? Procedure: CARDIOVERSION;  Surgeon: Larey Dresser, MD;  Location: [facility];  Service: Cardiovascular;  Laterality: N/A;  ? CHOLECYSTECTOMY N/A 11/13/2012  ? Procedure: LAPAROSCOPIC CHOLECYSTECTOMY;  Surgeon: Rolm Bookbinder, MD;  Location[facility];  Service: General;  Laterality: N/A;  ? COLONOSCOPY    ? CYSTOSCOPY W/ URETEROSCOPY W/ LITHOTRIPSY  1980's  ? CYSTOSCOPY WITH RETROGRADE PYELOGRAM, URETEROSCOPY AND STENT PLACEMENT  1980's  ? CYSTOSCOPY/URETEROSCOPY/HOLMIUM LASER/STENT PLACEMENT Right 07/06/2020  ? Procedure: CYSTOSCOPY/URETEROSCOPY/STENT PLACEMENT;  Surgeon: Abbie Sons, MD;  Location: [facility];  Service: Urology;  Laterality: Right;  ? CYSTOSCOPY/URETEROSCOPY/HOLMIUM LASER/STENT PLACEMENT Right 07/19/2020  ? Procedure: CYSTOSCOPY/URETEROSCOPY/HOLMIUM LASER/STENT EXCHANGE;  Surgeon: Abbie Sons, MD;  Location: [facility];  Service: Urology;  Laterality: Right;  ? ESOPHAGEAL DILATION    ? INGUINAL HERNIA REPAIR  ? 2008  ? "right" (02/01/2012)  ? POLYPECTOMY    ? VASECTOMY    ? ?Family History  ?Problem Relation Age of Onset  ? Lung cancer Father   ?     died @ 52.  Also had PPM  ? Heart disease Father   ? Kidney disease Mother   ?     died @ 9. Also had h/o CVA, breat cancer, diabetes, and PPM  ? Breast cancer Mother   ? Diabetes Mother   ? Heart disease Mother   ? Cancer Brother   ?     esophagus  ? Esophageal cancer Brother   ? Colon cancer Neg Hx   ? Rectal cancer Neg Hx   ? Stomach cancer Neg Hx   ? ?Social History  ? ?Socioeconomic History  ? Marital status: Married  ?  Spouse name: James Santiago  ? Number  of children: 1  ? Years of education: Not on file  ? Highest education level: Not on file  ?Occupational History  ?  Employer: UNEMPLOYED  ?Tobacco Use  ? Smoking status: Former  ?  Packs/day: 0.50  ?  Years: 12.00  ?  Pack years: 6.00  ?  Types: Cigarettes  ? Smokeless tobacco: Current  ?  Types: Chew  ? Tobacco comments:  ?  02/01/2012 smoked cigarettes "probably 30 yr ago."  Currently uses 1/2 pouch of chewing tobacco daily; offered cessation materials; pt declines.  ?Vaping Use  ? Vaping Use: Never used  ?Substance and Sexual Activity  ? Alcohol use: Not Currently  ?  Alcohol/week: 0.0 standard drinks  ?  Comment: occ. beer  ? Drug use: No  ? Sexual activity: Not Currently  ?Other Topics Concern  ? Not on file  ?Social History Narrative  ? Lives in [place] with wife.  They have one grown daughter, 2 grandsons and 1 granddaughter.  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  ? ?Social Determinants of Health  ? ?Financial Resource Strain: Low Risk   ? Difficulty of Paying Living Expenses: Not hard at all  ?Food Insecurity: No Food Insecurity  ? Worried About Charity fundraiser in the Last Year: Never true  ? Ran Out of Food in the Last Year: Never true  ?Transportation Needs: No Transportation Needs  ? Lack of Transportation (Medical): No  ? Lack of Transportation (Non-Medical): No  ?Physical Activity: Sufficiently Active  ? Days of Exercise per Week: 5 days  ? Minutes of Exercise per Session: 30 min  ?Stress: No Stress Concern Present  ? Feeling of Stress : Not at all  ?Social Connections: Moderately Isolated  ? Frequency of Communication with Friends and Family: More than three times a week  ? Frequency of Social Gatherings with Friends and Family: More than three times a week  ? Attends Religious Services: Never  ? Active Member of Clubs or Organizations: No  ? Attends Archivist Meetings: Never  ? Marital Status: Married  ? ? ?Tobacco Counseling ?Ready to quit: Not  Answered ?Counseling given: Not Answered ?Tobacco comments: 02/01/2012 smoked cigarettes "probably 30 yr ago."  Currently uses 1/2 pouch of chewing tobacco daily; offered cessation materials; pt declines. ? ? ?Clin

## 2021-06-01 NOTE — Patient Instructions (Signed)
James Santiago , ?Thank you for taking time to come for your Medicare Wellness Visit. I appreciate your ongoing commitment to your health goals. Please review the following plan we discussed and let me know if I can assist you in the future.  ? ?Screening recommendations/referrals: ?Colonoscopy: Done 01/04/2020 Repeat in 5 years ? ?Recommended yearly ophthalmology/optometry visit for glaucoma screening and checkup ?Recommended yearly dental visit for hygiene and checkup ? ?Vaccinations: ?Influenza vaccine: Due Fall of 2023. ?Pneumococcal vaccine: Done 05/26/2020.  ?Tdap vaccine: Done 02/15/2011 Repeat in 10 years ? ?Shingles vaccine: Discussed. Please bring copy of dates to enter into chart.   ?Covid-19: Done 04/12/2020, 07/21/2019, 06/24/2019. ? ?Advanced directives: Please bring a copy of your health care power of attorney and living will to the office to be added to your chart at your convenience. ? ?Conditions/risks identified: Aim for 30 minutes of exercise or brisk walking, 6-8 glasses of water, and 5 servings of fruits and vegetables each day. ?KEEP UP THE GOOD WORK!! ? ?Next appointment: Follow up in one year for your annual wellness visit. 2024. ? ?Preventive Care 66 Years and Older, Male ? ?Preventive care refers to lifestyle choices and visits with your health care provider that can promote health and wellness. ?What does preventive care include? ?A yearly physical exam. This is also called an annual well check. ?Dental exams once or twice a year. ?Routine eye exams. Ask your health care provider how often you should have your eyes checked. ?Personal lifestyle choices, including: ?Daily care of your teeth and gums. ?Regular physical activity. ?Eating a healthy diet. ?Avoiding tobacco and drug use. ?Limiting alcohol use. ?Practicing safe sex. ?Taking low doses of aspirin every day. ?Taking vitamin and mineral supplements as recommended by your health care provider. ?What happens during an annual well check? ?The  services and screenings done by your health care provider during your annual well check will depend on your age, overall health, lifestyle risk factors, and family history of disease. ?Counseling  ?Your health care provider may ask you questions about your: ?Alcohol use. ?Tobacco use. ?Drug use. ?Emotional well-being. ?Home and relationship well-being. ?Sexual activity. ?Eating habits. ?History of falls. ?Memory and ability to understand (cognition). ?Work and work Statistician. ?Screening  ?You may have the following tests or measurements: ?Height, weight, and BMI. ?Blood pressure. ?Lipid and cholesterol levels. These may be checked every 5 years, or more frequently if you are over 68 years old. ?Skin check. ?Lung cancer screening. You may have this screening every year starting at age 90 if you have a 30-pack-year history of smoking and currently smoke or have quit within the past 15 years. ?Fecal occult blood test (FOBT) of the stool. You may have this test every year starting at age 65. ?Flexible sigmoidoscopy or colonoscopy. You may have a sigmoidoscopy every 5 years or a colonoscopy every 10 years starting at age 24. ?Prostate cancer screening. Recommendations will vary depending on your family history and other risks. ?Hepatitis C blood test. ?Hepatitis B blood test. ?Sexually transmitted disease (STD) testing. ?Diabetes screening. This is done by checking your blood sugar (glucose) after you have not eaten for a while (fasting). You may have this done every 1-3 years. ?Abdominal aortic aneurysm (AAA) screening. You may need this if you are a current or former smoker. ?Osteoporosis. You may be screened starting at age 56 if you are at high risk. ?Talk with your health care provider about your test results, treatment options, and if necessary, the need for more  tests. ?Vaccines  ?Your health care provider may recommend certain vaccines, such as: ?Influenza vaccine. This is recommended every year. ?Tetanus,  diphtheria, and acellular pertussis (Tdap, Td) vaccine. You may need a Td booster every 10 years. ?Zoster vaccine. You may need this after age 14. ?Pneumococcal 13-valent conjugate (PCV13) vaccine. One dose is recommended after age 22. ?Pneumococcal polysaccharide (PPSV23) vaccine. One dose is recommended after age 28. ?Talk to your health care provider about which screenings and vaccines you need and how often you need them. ?This information is not intended to replace advice given to you by your health care provider. Make sure you discuss any questions you have with your health care provider. ?Document Released: 02/25/2015 Document Revised: 10/19/2015 Document Reviewed: 11/30/2014 ?Elsevier Interactive Patient Education ? 2017 [place]. ? ?Fall Prevention in the Home ?Falls can cause injuries. They can happen to people of all ages. There are many things you can do to make your home safe and to help prevent falls. ?What can I do on the outside of my home? ?Regularly fix the edges of walkways and driveways and fix any cracks. ?Remove anything that might make you trip as you walk through a door, such as a raised step or threshold. ?Trim any bushes or trees on the path to your home. ?Use bright outdoor lighting. ?Clear any walking paths of anything that might make someone trip, such as rocks or tools. ?Regularly check to see if handrails are loose or broken. Make sure that both sides of any steps have handrails. ?Any raised decks and porches should have guardrails on the edges. ?Have any leaves, snow, or ice cleared regularly. ?Use sand or salt on walking paths during winter. ?Clean up any spills in your garage right away. This includes oil or grease spills. ?What can I do in the bathroom? ?Use night lights. ?Install grab bars by the toilet and in the tub and shower. Do not use towel bars as grab bars. ?Use non-skid mats or decals in the tub or shower. ?If you need to sit down in the shower, use a plastic,  non-slip stool. ?Keep the floor dry. Clean up any water that spills on the floor as soon as it happens. ?Remove soap buildup in the tub or shower regularly. ?Attach bath mats securely with double-sided non-slip rug tape. ?Do not have throw rugs and other things on the floor that can make you trip. ?What can I do in the bedroom? ?Use night lights. ?Make sure that you have a light by your bed that is easy to reach. ?Do not use any sheets or blankets that are too big for your bed. They should not hang down onto the floor. ?Have a firm chair that has side arms. You can use this for support while you get dressed. ?Do not have throw rugs and other things on the floor that can make you trip. ?What can I do in the kitchen? ?Clean up any spills right away. ?Avoid walking on wet floors. ?Keep items that you use a lot in easy-to-reach places. ?If you need to reach something above you, use a strong step stool that has a grab bar. ?Keep electrical cords out of the way. ?Do not use floor polish or wax that makes floors slippery. If you must use wax, use non-skid floor wax. ?Do not have throw rugs and other things on the floor that can make you trip. ?What can I do with my stairs? ?Do not leave any items on the stairs. ?Make sure  that there are handrails on both sides of the stairs and use them. Fix handrails that are broken or loose. Make sure that handrails are as long as the stairways. ?Check any carpeting to make sure that it is firmly attached to the stairs. Fix any carpet that is loose or worn. ?Avoid having throw rugs at the top or bottom of the stairs. If you do have throw rugs, attach them to the floor with carpet tape. ?Make sure that you have a light switch at the top of the stairs and the bottom of the stairs. If you do not have them, ask someone to add them for you. ?What else can I do to help prevent falls? ?Wear shoes that: ?Do not have high heels. ?Have rubber bottoms. ?Are comfortable and fit you well. ?Are closed  at the toe. Do not wear sandals. ?If you use a stepladder: ?Make sure that it is fully opened. Do not climb a closed stepladder. ?Make sure that both sides of the stepladder are locked into place. ?Ask someone to h

## 2021-06-12 ENCOUNTER — Other Ambulatory Visit: Payer: Self-pay | Admitting: Family Medicine

## 2021-06-12 DIAGNOSIS — I10 Essential (primary) hypertension: Secondary | ICD-10-CM

## 2021-06-13 NOTE — Telephone Encounter (Signed)
Requested Prescriptions  ?Pending Prescriptions Disp Refills  ?? atenolol (TENORMIN) 50 MG tablet [Pharmacy Med Name: Atenolol 50 MG Oral Tablet] 90 tablet 0  ?  Sig: TAKE 1 TABLET BY MOUTH  DAILY  ?  ? Cardiovascular: Beta Blockers 2 Failed - 06/12/2021 10:38 PM  ?  ?  Failed - Cr in normal range and within 360 days  ?  Creat  ?Date Value Ref Range Status  ?07/12/2020 1.49 (H) 0.70 - 1.25 mg/dL Final  ?  Comment:  ?  For patients >70 years of age, the reference limit ?for Creatinine is approximately 13% higher for people ?identified as African-American. ?. ?  ? ?Creatinine, Ser  ?Date Value Ref Range Status  ?10/31/2020 1.50 (H) 0.61 - 1.24 mg/dL Final  ?   ?  ?  Failed - Last BP in normal range  ?  BP Readings from Last 1 Encounters:  ?03/23/21 (!) 142/98  ?   ?  ?  Passed - Last Heart Rate in normal range  ?  Pulse Readings from Last 1 Encounters:  ?03/23/21 78  ?   ?  ?  Passed - Valid encounter within last 6 months  ?  Recent Outpatient Visits   ?      ? 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 [facility] [place], Cammie Mcgee, MD  ? 8 months ago Rhinosinusitis  ? [facility] Pickard, Cammie Mcgee, MD  ? 8 months ago Rhinosinusitis  ? [facility] Pickard, Cammie Mcgee, MD  ? 11 months ago Stage 3 chronic kidney disease, unspecified whether stage 3a or 3b CKD ([place])  ? [facility] Pick[place], Cammie Mcgee, MD  ? 1 year ago History of gout  ? [facility] Pickard, Cammie Mcgee, MD  ?  ?  ?Future Appointments   ?        ? In 7 months Stoioff, Ronda Fairly, MD [place]  ?  ? ?  ?  ?  ? ?

## 2021-06-17 ENCOUNTER — Other Ambulatory Visit: Payer: Self-pay | Admitting: Family Medicine

## 2021-06-19 NOTE — Telephone Encounter (Signed)
Requested Prescriptions  ?Pending Prescriptions Disp Refills  ?? ELIQUIS 5 MG TABS tablet [Pharmacy Med Name: Eliquis 5 MG Oral Tablet] 180 tablet 3  ?  Sig: TAKE 1 TABLET BY MOUTH  TWICE DAILY  ?  ? Hematology:  Anticoagulants - apixaban Failed - 06/17/2021  5:22 AM  ?  ?  Failed - Cr in normal range and within 360 days  ?  Creat  ?Date Value Ref Range Status  ?07/12/2020 1.49 (H) 0.70 - 1.25 mg/dL Final  ?  Comment:  ?  For patients >70 years of age, the reference limit ?for Creatinine is approximately 13% higher for people ?identified as African-American. ?. ?  ? ?Creatinine, Ser  ?Date Value Ref Range Status  ?10/31/2020 1.50 (H) 0.61 - 1.24 mg/dL Final  ?   ?  ?  Passed - PLT in normal range and within 360 days  ?  Platelets  ?Date Value Ref Range Status  ?07/12/2020 154 140 - 400 Thousand/uL Final  ?   ?  ?  Passed - HGB in normal range and within 360 days  ?  Hemoglobin  ?Date Value Ref Range Status  ?07/12/2020 14.6 13.2 - 17.1 g/dL Final  ?   ?  ?  Passed - HCT in normal range and within 360 days  ?  HCT  ?Date Value Ref Range Status  ?07/12/2020 44.3 38.5 - 50.0 % Final  ?   ?  ?  Passed - AST in normal range and within 360 days  ?  AST  ?Date Value Ref Range Status  ?07/06/2020 20 15 - 41 U/L Final  ?   ?  ?  Passed - ALT in normal range and within 360 days  ?  ALT  ?Date Value Ref Range Status  ?07/06/2020 12 0 - 44 U/L Final  ?   ?  ?  Passed - Valid encounter within last 12 months  ?  Recent Outpatient Visits   ?      ? 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 [facility] [place], Cammie Mcgee, MD  ? 8 months ago Rhinosinusitis  ? [facility] Pickard, Cammie Mcgee, MD  ? 9 months ago Rhinosinusitis  ? [facility] Pickard, Cammie Mcgee, MD  ? 11 months ago Stage 3 chronic kidney disease, unspecified whether stage 3a or 3b CKD ([place])  ? [facility] Pickard, Cammie Mcgee, MD  ? 1 year ago History of gout  ? [facility] Pickard,  Cammie Mcgee, MD  ?  ?  ?Future Appointments   ?        ? In 7 months Stoioff, Ronda Fairly, MD [place]  ?  ? ?  ?  ?  ? ? ?

## 2021-08-14 ENCOUNTER — Other Ambulatory Visit: Payer: Self-pay | Admitting: Family Medicine

## 2021-08-14 DIAGNOSIS — I10 Essential (primary) hypertension: Secondary | ICD-10-CM

## 2021-08-16 ENCOUNTER — Other Ambulatory Visit: Payer: Self-pay | Admitting: Family Medicine

## 2021-08-16 DIAGNOSIS — I10 Essential (primary) hypertension: Secondary | ICD-10-CM

## 2021-08-16 NOTE — Telephone Encounter (Signed)
Requested Prescriptions  Pending Prescriptions Disp Refills  . 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 atenolol (TENORMIN) 50 MG tablet [Pharmacy Med Name: Atenolol 50 MG Oral Tablet] 90 tablet 1    Sig: TAKE 1 TABLET BY MOUTH DAILY     Cardiovascular: Beta Blockers 2 Failed - 08/14/2021  6:30 AM      Failed - Cr in normal range and within 360 days    Creat  Date Value Ref Range Status  07/12/2020 1.49 (H) 0.70 - 1.25 mg/dL Final    Comment:    For patients >31 years of age, the reference limit for Creatinine is approximately 13% higher for people identified as African-American. .    Creatinine, Ser  Date Value Ref Range Status  10/31/2020 1.50 (H) 0.61 - 1.24 mg/dL Final         Failed - Last BP in normal range    BP Readings from Last 1 Encounters:  03/23/21 (!) 142/98         Passed - Last Heart Rate in normal range    Pulse Readings from Last 1 Encounters:  03/23/21 78         Passed - Valid encounter  within last 6 months    Recent Outpatient Visits          4 months ago Mucopurulent chronic bronchitis ([address])   [place] Pickard, Cammie Mcgee, MD   10 months ago [place] Dennard Schaumann, Cammie Mcgee, MD   11 months ago [place] Susy Frizzle, MD   1 year ago Stage 3 chronic kidney disease, unspecified whether stage 3a or 3b CKD ([place])   [place] Pickard, Cammie Mcgee, MD   1 year ago History of gout   [place] Pickard, Cammie Mcgee, MD      Future Appointments            In 5 months [place], Ronda Fairly, MD [place]

## 2021-09-15 ENCOUNTER — Other Ambulatory Visit: Payer: Self-pay | Admitting: Family Medicine

## 2021-10-12 ENCOUNTER — Other Ambulatory Visit: Payer: Self-pay | Admitting: Family Medicine

## 2021-11-13 ENCOUNTER — Encounter: Payer: Self-pay | Admitting: Family Medicine

## 2021-11-16 ENCOUNTER — Other Ambulatory Visit: Payer: Self-pay | Admitting: Family Medicine

## 2021-11-16 NOTE — Telephone Encounter (Signed)
Rx both 01/26/21 #90 3RF- 1 year supply Requested Prescriptions  Pending Prescriptions Disp Refills  . allopurinol (ZYLOPRIM) 300 MG tablet [Pharmacy Med Name: Allopurinol 300 MG Oral Tablet] 90 tablet 3    Sig: TAKE 1 TABLET BY MOUTH DAILY     Endocrinology:  Gout Agents - allopurinol Failed - 11/16/2021 12:38 PM      Failed - Uric Acid in normal range and within 360 days    Uric Acid, Serum  Date Value Ref Range Status  07/12/2020 4.0 4.0 - 8.0 mg/dL Final    Comment:    Therapeutic target for gout patients: <6.0 mg/dL .          Failed - Cr in normal range and within 360 days    Creat  Date Value Ref Range Status  07/12/2020 1.49 (H) 0.70 - 1.25 mg/dL Final    Comment:    For patients >78 years of age, the reference limit for Creatinine is approximately 13% higher for people identified as African-American. .    Creatinine, Ser  Date Value Ref Range Status  10/31/2020 1.50 (H) 0.61 - 1.24 mg/dL Final         Failed - CBC within normal limits and completed in the last 12 months    WBC  Date Value Ref Range Status  07/12/2020 5.5 3.8 - 10.8 Thousand/uL Final   RBC  Date Value Ref Range Status  07/12/2020 4.78 4.20 - 5.80 Million/uL Final   Hemoglobin  Date Value Ref Range Status  07/12/2020 14.6 13.2 - 17.1 g/dL Final   HCT  Date Value Ref Range Status  07/12/2020 44.3 38.5 - 50.0 % Final   MCHC  Date Value Ref Range Status  07/12/2020 33.0 32.0 - 36.0 g/dL Final   [facility] [place]  Date Value Ref Range Status  07/12/2020 30.5 27.0 - 33.0 pg Final   MCV  Date Value Ref Range Status  07/12/2020 92.7 80.0 - 100.0 fL Final   No results found for: "PLTCOUNTKUC", "LABPLAT", "POCPLA" RDW  Date Value Ref Range Status  07/12/2020 14.2 11.0 - 15.0 % Final         Passed - Valid encounter within last 12 months    Recent Outpatient Visits          7 months ago 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 simvastatin (ZOCOR) 10 MG tablet [Pharmacy Med Name: Simvastatin 10 MG Oral Tablet] 90 tablet 3    Sig: TAKE 1 TABLET BY MOUTH AT  BEDTIME     Cardiovascular:  Antilipid - Statins Failed - 11/16/2021 12:38 PM      Failed - Lipid Panel in normal range within the last 12 months    Cholesterol  Date Value Ref Range Status  05/20/2020 101 <200  mg/dL Final   LDL Cholesterol (Calc)  Date Value Ref Range Status  05/20/2020 48 mg/dL (calc) Final    Comment:    Reference range: <100 . Desirable range <100 mg/dL for primary prevention;   <70 mg/dL for patients with CHD or diabetic patients  with > or = 2 CHD risk factors. Mar[place] Kitchen LDL-C is now calculated using the Martin-Hopkins  calculation, which is a validated novel method providing  better accuracy than the Friedewald equation in the  estimation of LDL-C.  Cresenciano Genre et al. Annamaria Helling. 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

## 2021-12-08 ENCOUNTER — Ambulatory Visit: Payer: Medicare Other | Admitting: Urology

## 2021-12-12 ENCOUNTER — Ambulatory Visit
Admission: RE | Admit: 2021-12-12 | Discharge: 2021-12-12 | Disposition: A | Discharge status: Home / Self Care | Payer: Medicare Other | Source: Ambulatory Visit | Attending: Urology | Admitting: Urology

## 2021-12-12 ENCOUNTER — Encounter: Payer: Self-pay | Admitting: Physician Assistant

## 2021-12-12 ENCOUNTER — Ambulatory Visit: Payer: Medicare Other | Admitting: Physician Assistant

## 2021-12-12 ENCOUNTER — Other Ambulatory Visit: Payer: Self-pay | Admitting: Physician Assistant

## 2021-12-12 VITALS — BP 127/84 | HR 103 | Ht 74.0 in | Wt 238.0 lb

## 2021-12-12 DIAGNOSIS — N201 Calculus of ureter: Secondary | ICD-10-CM

## 2021-12-12 DIAGNOSIS — N200 Calculus of kidney: Secondary | ICD-10-CM

## 2021-12-12 DIAGNOSIS — N23 Unspecified renal colic: Secondary | ICD-10-CM

## 2021-12-12 DIAGNOSIS — R310 Gross hematuria: Secondary | ICD-10-CM

## 2021-12-12 DIAGNOSIS — N202 Calculus of kidney with calculus of ureter: Secondary | ICD-10-CM

## 2021-12-12 LAB — URINALYSIS, COMPLETE
Bilirubin, UA: NEGATIVE
Glucose, UA: NEGATIVE
Ketones, UA: NEGATIVE
Leukocytes,UA: NEGATIVE
Nitrite, UA: NEGATIVE
Specific Gravity, UA: 1.02 (ref 1.005–1.030)
Urobilinogen, Ur: 0.2 mg/dL (ref 0.2–1.0)
pH, UA: 5.5 (ref 5.0–7.5)

## 2021-12-12 LAB — MICROSCOPIC EXAMINATION: RBC, Urine: >30 /hpf — AB (ref 0–2)

## 2021-12-12 MED ORDER — ONDANSETRON 4 MG PO TBDP: 4.0000 mg | ORAL_TABLET | Freq: Three times a day (TID) | ORAL | 0 refills | Status: DC | PRN

## 2021-12-12 MED ORDER — OXYCODONE-ACETAMINOPHEN 5-325 MG PO TABS: 1.0000 | ORAL_TABLET | Freq: Four times a day (QID) | ORAL | 0 refills | Status: AC | PRN

## 2021-12-12 NOTE — Progress Notes (Signed)
12/12/2021 12:47 PM   James Santiago 1951-02-10 [id number]  CC: Chief Complaint  Patient presents with   Nephrolithiasis   HPI: James Santiago is a 71 y.o. male with PMH Afib on Eliquis and nephrolithiasis who presents today for evaluation of a possible acute stone episode.   Today he reports 2.5 weeks of intermittent gross hematuria and LLQ pain radiating to the groin responsive to pain medication.  He is asymptomatic today.  He denies fever, chills, nausea, or vomiting.  KUB today shows a 9x66m distal left ureteral stone as well as multiple nonobstructing left renal stones.  In-office UA today positive for 3+ blood and 1+ protein; urine microscopy with >30 RBCs/HPF.  PMH: Past Medical History:  Diagnosis Date   [place]-fib ([facility]    a. Dx 01/2012, CHADS2 = 1.-[facility] in past.   Adenomatous colon polyp 06/2003   Arthritis    "fingers" (02/01/2012)   [place]rial [place] ([facility] [place]    Biliary dyskinesia    Cholelithiasis    "still got 2 small stones in there" (02/01/2012)   Esophageal stricture    GERD (gastroesophageal reflux disease)    Gout    Hemorrhoids    Hiatal hernia    History of colon polyps    History of kidney stones    Hypercholesteremia    Hypertension    Kidney stone    Nephrolithiasis    "was told kidney function at 65%"- hx. kidney stones    Surgical History: Past Surgical History:  Procedure Laterality Date   CARDIOVERSION  03/06/2012   Procedure: CARDIOVERSION;  Surgeon: DLarey Dresser MD;  [facility]  Service: Cardiovascular;  Laterality: N/A;   CHOLECYSTECTOMY N/A 11/13/2012   Procedure: LAPAROSCOPIC CHOLECYSTECTOMY;  Surgeon: MRolm Bookbinder MD;  [facility];  Service: General;  Laterality: N/A;   COLONOSCOPY     CYSTOSCOPY W/ URETEROSCOPY W/ LITHOTRIPSY  1980's   CYSTOSCOPY WITH RETROGRADE PYELOGRAM, URETEROSCOPY AND STENT PLACEMENT  1980's   CYSTOSCOPY/URETEROSCOPY/HOLMIUM LASER/STENT PLACEMENT Right 07/06/2020    Procedure: CYSTOSCOPY/URETEROSCOPY/STENT PLACEMENT;  Surgeon: SAbbie Sons MD;  Location: [facility];  Service: Urology;  Laterality: Right;   CYSTOSCOPY/URETEROSCOPY/HOLMIUM LASER/STENT PLACEMENT Right 07/19/2020   Procedure: CYSTOSCOPY/URETEROSCOPY/HOLMIUM LASER/STENT EXCHANGE;  Surgeon: SAbbie Sons MD;  Location: [facility];  Service: Urology;  Laterality: Right;   ESOPHAGEAL DILATION     INGUINAL HERNIA REPAIR  ? 2008   "right" (02/01/2012)   POLYPECTOMY     VASECTOMY      Home Medications:  Allergies as of 12/12/2021       Reactions   Amoxicillin Diarrhea   Augmentin [amoxicillin-pot Clavulanate]    Diarrhea and sick        Medication List        Accurate as of December 12, 2021 12:47 PM. If you have any questions, ask your nurse or doctor.          allopurinol 300 MG tablet Commonly known as: ZYLOPRIM TAKE 1 TABLET BY MOUTH  DAILY   amLODipine 2.5 MG tablet Commonly known as: NORVASC TAKE 1 TABLET BY MOUTH  DAILY   atenolol 50 MG tablet Commonly known as: TENORMIN TAKE 1 TABLET BY MOUTH DAILY   calcium carbonate 750 MG chewable tablet Commonly known as: TUMS EX Chew 1 tablet by mouth daily as needed for indigestion or heartburn.   dexlansoprazole 60 MG capsule Commonly known as: DEXILANT Take 1 capsule by mouth daily.   doxazosin 4 MG tablet Commonly known as: CARDURA TAKE 1 TABLET  BY MOUTH  DAILY   Eliquis 5 MG Tabs tablet Generic drug: apixaban TAKE 1 TABLET BY MOUTH  TWICE DAILY   famotidine 20 MG tablet Commonly known as: PEPCID Take 20 mg by mouth daily as needed for heartburn or indigestion.   fluticasone 50 MCG/ACT nasal spray Commonly known as: FLONASE SPRAY 2 SPRAYS INTO EACH NOSTRIL EVERY DAY   lidocaine-prilocaine cream Commonly known as: EMLA Apply 1 application topically as needed.   pantoprazole 40 MG tablet Commonly known as: PROTONIX Take 1 tablet (40 mg total) by mouth daily.   simethicone 125 MG chewable  tablet Commonly known as: MYLICON Chew 440 mg by mouth every 6 (six) hours as needed for flatulence.   simvastatin 10 MG tablet Commonly known as: ZOCOR TAKE 1 TABLET BY MOUTH AT  BEDTIME   telmisartan 80 MG tablet Commonly known as: MICARDIS TAKE 1 TABLET BY MOUTH IN  THE MORNING        Allergies:  Allergies  Allergen Reactions   Amoxicillin Diarrhea   Augmentin [Amoxicillin-Pot Clavulanate]     Diarrhea and sick    Family History: Family History  Problem Relation Age of Onset   Lung cancer Father        died @ 50.  Also had PPM   Heart disease Father    Kidney disease Mother        died @ 72. Also had h/o CVA, breat cancer, diabetes, and PPM   Breast cancer Mother    Diabetes Mother    Heart disease Mother    Cancer Brother        esophagus   Esophageal cancer Brother    Colon cancer Neg Hx    Rectal cancer Neg Hx    Stomach cancer Neg Hx     Social History:   reports that he has quit smoking. His smoking use included cigarettes. He has a 6.00 pack-year smoking history. His smokeless tobacco use includes chew. He reports that he does not currently use alcohol. He reports that he does not use drugs.  Physical Exam: BP 127/84   Pulse (!) 103   Ht '6\' 2"'$  (1.88 m)   Wt 238 lb (108 kg)   BMI 30.56 kg/m   Constitutional:  Alert and oriented, no acute distress, nontoxic appearing HEENT: [place], AT Cardiovascular: No clubbing, cyanosis, or edema Respiratory: Normal respiratory effort, no increased work of breathing Skin: No rashes, bruises or suspicious lesions Neurologic: Grossly intact, no focal deficits, moving all 4 extremities Psychiatric: Normal mood and affect  Laboratory Data: Results for orders placed or performed in visit on 12/12/21  Microscopic Examination   Urine  Result Value Ref Range   WBC, UA 0-5 0 - 5 /hpf   RBC, Urine >30 (A) 0 - 2 /hpf   Epithelial Cells (non renal) 0-10 0 - 10 /hpf   Bacteria, UA Few None seen/Few  Urinalysis, Complete   Result Value Ref Range   Specific Gravity, UA 1.020 1.005 - 1.030   pH, UA 5.5 5.0 - 7.5   Color, UA Yellow Yellow   Appearance Ur Hazy (A) Clear   Leukocytes,UA Negative Negative   Protein,UA 1+ (A) Negative/Trace   Glucose, UA Negative Negative   Ketones, UA Negative Negative   RBC, UA 3+ (A) Negative   Bilirubin, UA Negative Negative   Urobilinogen, Ur 0.2 0.2 - 1.0 mg/dL   Nitrite, UA Negative Negative   Microscopic Examination See below:    Pertinent Imaging: KUB, 12/12/2021: CLINICAL DATA:  Renal stones   EXAM: ABDOMEN - 1 VIEW   COMPARISON:  01/25/2021   FINDINGS: Bowel gas pattern is nonspecific. Small amount of stool is seen in right colon. Surgical clips are seen in gallbladder fossa. There are multiple left renal stones largest measuring 13 mm in size. There are possible few small calcific densities overlying the right kidney measuring up to 4 mm in size. There is 11 mm calcific density overlying the left side of the sacrum in the distal course of left ureter. This finding was not seen in the previous examination. There are linear densities in left lower lung fields with no significant change suggesting scarring. Degenerative changes are noted in lumbar spine.   IMPRESSION: Multiple left renal stones.  Possible few small right renal stones.   There is 11 mm calcific density overlying the left side of sacrum in the distal course of left ureter suggesting possible left ureteral calculus. Follow-up CT abdomen and pelvis may be considered.   These results will be called to the ordering clinician or representative by the Radiologist Assistant, and communication documented in the PACS or [employer].     Electronically Signed   By: Elmer Picker M.D.   On: 12/14/2021 20:26  I personally reviewed the images referenced above and note a 9 x 12 mm distal left ureteral stone as well as multiple nonobstructing left renal stones.  Assessment & Plan:    1. Left ureteral stone Intermittent left renal colic and gross hematuria associated with a radiopaque 9 x 12 mm distal left ureteral stone, also with multiple nonobstructing left renal stones on KUB today.  His UA is rather bland and he is not clinically infected today.  We discussed various treatment options for his stone including trial of passage vs. ESWL vs. ureteroscopy with laser lithotripsy and stent. We discussed the risks and benefits of all procedures including bleeding, infection, and damage to surrounding structures.  We specifically discussed that ESWL is a less invasive procedure that requires less anesthesia, however patients have to pass their residual stone fragments, which may take several weeks and be associated with continued renal colic. Additionally, we discussed the limitations of ESWL including the ability to only treat one stone in a single treatment and the low, 10-20% chance of treatment failure requiring repeat ESWL versus ureteroscopy in the future. By comparison, ureteroscopy is a more invasive surgical procedure that requires more anesthesia, but we can potentially treat multiple stones in a single procedure. However, URS does require placement of a ureteral stent, which will remain in place for approximately 3-10 days and can be associated with flank pain, bladder pain, dysuria, urgency, frequency, urinary leakage, and gross hematuria.  Based on this conversation, he would like to proceed with left URS/LL/stent.  Orders placed today.  Would prefer to have him off Eliquis, will defer to anesthesia regarding cardiac clearance for this.  I am prescribing Zofran and Percocet for symptom control in the interim and we discussed return precautions including fevers, chills, uncontrollable pain, and uncontrollable nausea/vomiting.  I am not prescribing Flomax as he is already on doxazosin. - Urinalysis, Complete - CULTURE, URINE COMPREHENSIVE - ondansetron (ZOFRAN-ODT) 4 MG  disintegrating tablet; Take 1 tablet (4 mg total) by mouth every 8 (eight) hours as needed for nausea or vomiting.  Dispense: 20 tablet; Refill: 0 - oxyCODONE-acetaminophen (PERCOCET/ROXICET) 5-325 MG tablet; Take 1-2 tablets by mouth every 6 (six) hours as needed for up to 5 days for severe pain.  Dispense: 16 tablet;  Refill: 0   Return in 1 week (on 12/19/2021) for Left ureteroscopy with laser lithotripsy and stent placement with Dr. Bernardo Heater.  Debroah Loop, PA-C  [facility] [address], [place], [place] [address] [phone]

## 2021-12-12 NOTE — Patient Instructions (Addendum)
I'm getting you added on to Dr. Dene Gentry schedule for a ureteroscopy next Tuesday, 11/7. Melissa will reach out to you via MyChart with more details. In the meantime, please do the following: -Treat any pain with ibuprofen/tylenol or Percocet as prescribed today -Treat any nausea with Zofran as prescribed today  Please call our office immediately (we are open 8a-5p Monday-Friday) or go to the Emergency Department if you develop any of the following: -Fever -Chills -Nausea and/or vomiting uncontrollable with Zofran -Pain uncontrollable with Percocet

## 2021-12-12 NOTE — Progress Notes (Unsigned)
Surgical Physician Order Form [facility] [place]  Dr. Bernardo Heater * Scheduling expectation :  12/19/2021  *Length of Case:   *Clearance needed: per anesthesia  *Anticoagulation Instructions: Hold all anticoagulants  *Aspirin Instructions: N/A  *Post-op visit Date/Instructions:  1 week cysto stent removal  *Diagnosis: Left Ureteral Stone  *Procedure: left Ureteroscopy w/laser lithotripsy & stent placement ([id number])   Additional orders: N/A  -Admit type: OUTpatient  -Anesthesia: General  -VTE Prophylaxis Standing Order SCD's       Other:   -Standing Lab Orders Per Anesthesia    Lab other: None  -Standing Test orders EKG/Chest x-ray per Anesthesia       Test other:   - Medications:  Ancef 2gm IV  -Other orders:  N/A

## 2021-12-12 NOTE — H&P (View-Only) (Signed)
12/12/2021 12:47 PM   James Santiago Jul 06, 1950 [id number]  CC: Chief Complaint  Patient presents with   Nephrolithiasis   HPI: James Santiago is a 71 y.o. male with PMH Afib on Eliquis and nephrolithiasis who presents today for evaluation of a possible acute stone episode.   Today he reports 2.5 weeks of intermittent gross hematuria and LLQ pain radiating to the groin responsive to pain medication.  He is asymptomatic today.  He denies fever, chills, nausea, or vomiting.  KUB today shows a 9x38m distal left ureteral stone as well as multiple nonobstructing left renal stones.  In-office UA today positive for 3+ blood and 1+ protein; urine microscopy with >30 RBCs/HPF.  PMH: Past Medical History:  Diagnosis Date   [place]    a. Dx 01/2012, CHADS2 = 1.-[facility] in past.   Adenomatous colon polyp 06/2003   Arthritis    "fingers" (02/01/2012)   Atrial [place] ([facility]    Biliary dyskinesia    Cholelithiasis    "still got 2 small stones in there" (02/01/2012)   Esophageal stricture    GERD (gastroesophageal reflux disease)    Gout    Hemorrhoids    Hiatal hernia    History of colon polyps    History of kidney stones    Hypercholesteremia    Hypertension    Kidney stone    Nephrolithiasis    "was told kidney function at 65%"- hx. kidney stones    Surgical History: Past Surgical History:  Procedure Laterality Date   CARDIOVERSION  03/06/2012   Procedure: CARDIOVERSION;  Surgeon: DLarey Dresser MD;  [facility]  Service: Cardiovascular;  Laterality: N/A;   CHOLECYSTECTOMY N/A 11/13/2012   Procedure: LAPAROSCOPIC CHOLECYSTECTOMY;  Surgeon: MRolm Bookbinder MD;  [facility];  Service: General;  Laterality: N/A;   COLONOSCOPY     CYSTOSCOPY W/ URETEROSCOPY W/ LITHOTRIPSY  1980's   CYSTOSCOPY WITH RETROGRADE PYELOGRAM, URETEROSCOPY AND STENT PLACEMENT  1980's   CYSTOSCOPY/URETEROSCOPY/HOLMIUM LASER/STENT PLACEMENT Right 07/06/2020    Procedure: CYSTOSCOPY/URETEROSCOPY/STENT PLACEMENT;  Surgeon: SAbbie Sons MD;  Location: [facility];  Service: Urology;  Laterality: Right;   CYSTOSCOPY/URETEROSCOPY/HOLMIUM LASER/STENT PLACEMENT Right 07/19/2020   Procedure: CYSTOSCOPY/URETEROSCOPY/HOLMIUM LASER/STENT EXCHANGE;  Surgeon: SAbbie Sons MD;  Location: [facility];  Service: Urology;  Laterality: Right;   ESOPHAGEAL DILATION     INGUINAL HERNIA REPAIR  ? 2008   "right" (02/01/2012)   POLYPECTOMY     VASECTOMY      Home Medications:  Allergies as of 12/12/2021       Reactions   Amoxicillin Diarrhea   Augmentin [amoxicillin-pot Clavulanate]    Diarrhea and sick        Medication List        Accurate as of December 12, 2021 12:47 PM. If you have any questions, ask your nurse or doctor.          allopurinol 300 MG tablet Commonly known as: ZYLOPRIM TAKE 1 TABLET BY MOUTH  DAILY   amLODipine 2.5 MG tablet Commonly known as: NORVASC TAKE 1 TABLET BY MOUTH  DAILY   atenolol 50 MG tablet Commonly known as: TENORMIN TAKE 1 TABLET BY MOUTH DAILY   calcium carbonate 750 MG chewable tablet Commonly known as: TUMS EX Chew 1 tablet by mouth daily as needed for indigestion or heartburn.   dexlansoprazole 60 MG capsule Commonly known as: DEXILANT Take 1 capsule by mouth daily.   doxazosin 4 MG tablet Commonly known as: CARDURA TAKE 1 TABLET  BY MOUTH  DAILY   Eliquis 5 MG Tabs tablet Generic drug: apixaban TAKE 1 TABLET BY MOUTH  TWICE DAILY   famotidine 20 MG tablet Commonly known as: PEPCID Take 20 mg by mouth daily as needed for heartburn or indigestion.   fluticasone 50 MCG/ACT nasal spray Commonly known as: FLONASE SPRAY 2 SPRAYS INTO EACH NOSTRIL EVERY DAY   lidocaine-prilocaine cream Commonly known as: EMLA Apply 1 application topically as needed.   pantoprazole 40 MG tablet Commonly known as: PROTONIX Take 1 tablet (40 mg total) by mouth daily.   simethicone 125 MG chewable  tablet Commonly known as: MYLICON Chew 778 mg by mouth every 6 (six) hours as needed for flatulence.   simvastatin 10 MG tablet Commonly known as: ZOCOR TAKE 1 TABLET BY MOUTH AT  BEDTIME   telmisartan 80 MG tablet Commonly known as: MICARDIS TAKE 1 TABLET BY MOUTH IN  THE MORNING        Allergies:  Allergies  Allergen Reactions   Amoxicillin Diarrhea   Augmentin [Amoxicillin-Pot Clavulanate]     Diarrhea and sick    Family History: Family History  Problem Relation Age of Onset   Lung cancer Father        died @ 25.  Also had PPM   Heart disease Father    Kidney disease Mother        died @ 25. Also had h/o CVA, breat cancer, diabetes, and PPM   Breast cancer Mother    Diabetes Mother    Heart disease Mother    Cancer Brother        esophagus   Esophageal cancer Brother    Colon cancer Neg Hx    Rectal cancer Neg Hx    Stomach cancer Neg Hx     Social History:   reports that he has quit smoking. His smoking use included cigarettes. He has a 6.00 pack-year smoking history. His smokeless tobacco use includes chew. He reports that he does not currently use alcohol. He reports that he does not use drugs.  Physical Exam: BP 127/84   Pulse (!) 103   Ht '6\' 2"'$  (1.88 m)   Wt 238 lb (108 kg)   BMI 30.56 kg/m   Constitutional:  Alert and oriented, no acute distress, nontoxic appearing HEENT: , AT Cardiovascular: No clubbing, cyanosis, or edema Respiratory: Normal respiratory effort, no increased work of breathing Skin: No rashes, bruises or suspicious lesions Neurologic: Grossly intact, no focal deficits, moving all 4 extremities Psychiatric: Normal mood and affect  Laboratory Data: Results for orders placed or performed in visit on 12/12/21  Microscopic Examination   Urine  Result Value Ref Range   WBC, UA 0-5 0 - 5 /hpf   RBC, Urine >30 (A) 0 - 2 /hpf   Epithelial Cells (non renal) 0-10 0 - 10 /hpf   Bacteria, UA Few None seen/Few  Urinalysis, Complete   Result Value Ref Range   Specific Gravity, UA 1.020 1.005 - 1.030   pH, UA 5.5 5.0 - 7.5   Color, UA Yellow Yellow   Appearance Ur Hazy (A) Clear   Leukocytes,UA Negative Negative   Protein,UA 1+ (A) Negative/Trace   Glucose, UA Negative Negative   Ketones, UA Negative Negative   RBC, UA 3+ (A) Negative   Bilirubin, UA Negative Negative   Urobilinogen, Ur 0.2 0.2 - 1.0 mg/dL   Nitrite, UA Negative Negative   Microscopic Examination See below:    Pertinent Imaging: KUB, 12/12/2021: CLINICAL DATA:  Renal stones   EXAM: ABDOMEN - 1 VIEW   COMPARISON:  01/25/2021   FINDINGS: Bowel gas pattern is nonspecific. Small amount of stool is seen in right colon. Surgical clips are seen in gallbladder fossa. There are multiple left renal stones largest measuring 13 mm in size. There are possible few small calcific densities overlying the right kidney measuring up to 4 mm in size. There is 11 mm calcific density overlying the left side of the sacrum in the distal course of left ureter. This finding was not seen in the previous examination. There are linear densities in left lower lung fields with no significant change suggesting scarring. Degenerative changes are noted in lumbar spine.   IMPRESSION: Multiple left renal stones.  Possible few small right renal stones.   There is 11 mm calcific density overlying the left side of sacrum in the distal course of left ureter suggesting possible left ureteral calculus. Follow-up CT abdomen and pelvis may be considered.   These results will be called to the ordering clinician or representative by the Radiologist Assistant, and communication documented in the PACS or [employer].     Electronically Signed   By: Elmer Picker M.D.   On: 12/14/2021 20:26  I personally reviewed the images referenced above and note a 9 x 12 mm distal left ureteral stone as well as multiple nonobstructing left renal stones.  Assessment & Plan:    1. Left ureteral stone Intermittent left renal colic and gross hematuria associated with a radiopaque 9 x 12 mm distal left ureteral stone, also with multiple nonobstructing left renal stones on KUB today.  His UA is rather bland and he is not clinically infected today.  We discussed various treatment options for his stone including trial of passage vs. ESWL vs. ureteroscopy with laser lithotripsy and stent. We discussed the risks and benefits of all procedures including bleeding, infection, and damage to surrounding structures.  We specifically discussed that ESWL is a less invasive procedure that requires less anesthesia, however patients have to pass their residual stone fragments, which may take several weeks and be associated with continued renal colic. Additionally, we discussed the limitations of ESWL including the ability to only treat one stone in a single treatment and the low, 10-20% chance of treatment failure requiring repeat ESWL versus ureteroscopy in the future. By comparison, ureteroscopy is a more invasive surgical procedure that requires more anesthesia, but we can potentially treat multiple stones in a single procedure. However, URS does require placement of a ureteral stent, which will remain in place for approximately 3-10 days and can be associated with flank pain, bladder pain, dysuria, urgency, frequency, urinary leakage, and gross hematuria.  Based on this conversation, he would like to proceed with left URS/LL/stent.  Orders placed today.  Would prefer to have him off Eliquis, will defer to anesthesia regarding cardiac clearance for this.  I am prescribing Zofran and Percocet for symptom control in the interim and we discussed return precautions including fevers, chills, uncontrollable pain, and uncontrollable nausea/vomiting.  I am not prescribing Flomax as he is already on doxazosin. - Urinalysis, Complete - CULTURE, URINE COMPREHENSIVE - ondansetron (ZOFRAN-ODT) 4 MG  disintegrating tablet; Take 1 tablet (4 mg total) by mouth every 8 (eight) hours as needed for nausea or vomiting.  Dispense: 20 tablet; Refill: 0 - oxyCODONE-acetaminophen (PERCOCET/ROXICET) 5-325 MG tablet; Take 1-2 tablets by mouth every 6 (six) hours as needed for up to 5 days for severe pain.  Dispense: 16 tablet;  Refill: 0   Return in 1 week (on 12/19/2021) for Left ureteroscopy with laser lithotripsy and stent placement with Dr. Bernardo Heater.  Debroah Loop, PA-C  [facility] [address] [place], [place] [address] [phone]

## 2021-12-13 ENCOUNTER — Telehealth: Payer: Self-pay

## 2021-12-13 NOTE — Progress Notes (Signed)
[facility] Posting Form   Surgery Date/Time: Date: 12/19/2021  Surgeon: Dr. John Giovanni, MD   Surgery Location: Day Surgery  Inpt ( No  )   Outpt (Yes)   Obs ( No  )   Diagnosis: N20.1 Left Ureteral Stone  -CPT: [phone]  Surgery: Left Ureteroscopy with Laser lithotripsy and stent placement  Stop Anticoagulations: Yes  Cardiac/Medical/Pulmonary Clearance needed: yes  Clearance needed from Dr: Dennard Schaumann  Clearance request sent on: Date: 12/13/21   *Orders entered into EPIC  Date: 12/13/21   *Case booked in EPIC  Date: 12/12/2021  *Notified pt of Surgery: Date: 12/12/2021  PRE-OP UA & CX: no  *Placed into Prior Authorization Work [place] Date: 12/13/21   Assistant/laser/rep:No

## 2021-12-13 NOTE — Telephone Encounter (Signed)
I spoke with James Santiago while in clinic . We have discussed possible surgery dates and Tuesday November 7th, 2023 was agreed upon by all parties. Patient given information about surgery date, what to expect pre-operatively and post operatively.  We discussed that a Pre-Admission Testing office will be calling to set up the pre-op visit that will take place prior to surgery, and that these appointments are typically done over the phone with a Pre-Admissions RN.  Informed patient that our office will communicate any additional care to be provided after surgery. Patients questions or concerns were discussed during our call. Advised to call our office should there be any additional information, questions or concerns that arise. Patient verbalized understanding.

## 2021-12-13 NOTE — Progress Notes (Signed)
REQUEST FOR SURGICAL CLEARANCE       Date: Date: 12/13/21  Faxed to: Dr. Dennard Schaumann, MD  Surgeon: Dr. John Giovanni, MD     Date of Surgery: 12/19/2021  Operation: Left Ureteroscopy with Laser lithotripsy and stent placement  Anesthesia Type: General   Diagnosis: Left Ureteral Stone  Patient Requires:   Medical Clearance : Yes  Reason: Patient will need to hold Eliquis prior to procedure, would like to know how many days?   Risk Assessment:    Low   '[]'$       Moderate   '[]'$     High   '[]'$           This patient is optimized for surgery  YES '[]'$       NO   '[]'$    I recommend further assessment/workup prior to surgery. YES '[]'$      NO  '[]'$   Appointment scheduled for: _______________________   Further recommendations: ____________________________________     Physician Signature:__________________________________   Printed Name: ________________________________________   Date: _________________

## 2021-12-15 ENCOUNTER — Telehealth: Payer: Self-pay | Admitting: Family Medicine

## 2021-12-15 ENCOUNTER — Other Ambulatory Visit: Payer: Self-pay | Admitting: Urology

## 2021-12-15 DIAGNOSIS — N201 Calculus of ureter: Secondary | ICD-10-CM

## 2021-12-15 NOTE — Telephone Encounter (Signed)
No CT needed he is scheduled for Ureteroscopy per [facility].

## 2021-12-15 NOTE — Telephone Encounter (Signed)
-----   Message from Abbie Sons, MD sent at 12/15/2021  7:00 AM EDT ----- KUB reviewed and there appears to be a large calculus in the left mid ureter.  Recommend CT to verify if this is a stone.  Order was entered.  Keep scheduled follow-up 11/17

## 2021-12-16 LAB — CULTURE, URINE COMPREHENSIVE

## 2021-12-18 ENCOUNTER — Encounter
Admission: RE | Admit: 2021-12-18 | Discharge: 2021-12-18 | Disposition: A | Discharge status: Home / Self Care | Payer: Medicare Other | Source: Ambulatory Visit | Attending: Urology | Admitting: Urology

## 2021-12-18 ENCOUNTER — Telehealth: Payer: Self-pay

## 2021-12-18 VITALS — Ht 74.0 in | Wt 238.0 lb

## 2021-12-18 DIAGNOSIS — I10 Essential (primary) hypertension: Secondary | ICD-10-CM

## 2021-12-18 DIAGNOSIS — I482 Chronic atrial fibrillation, unspecified: Secondary | ICD-10-CM

## 2021-12-18 HISTORY — DX: Cardiac arrhythmia, unspecified: I49.9

## 2021-12-18 MED ORDER — CEFAZOLIN SODIUM-DEXTROSE 2-4 GM/100ML-% IV SOLN
2.0000 g | INTRAVENOUS | Status: AC
  Administered 2021-12-19: 2 g via INTRAVENOUS

## 2021-12-18 MED ORDER — ORAL CARE MOUTH RINSE: 15.0000 mL | Freq: Once | OROMUCOSAL | Status: AC

## 2021-12-18 MED ORDER — LACTATED RINGERS IV SOLN: INTRAVENOUS | Status: DC

## 2021-12-18 MED ORDER — CHLORHEXIDINE GLUCONATE 0.12 % MT SOLN: 15.0000 mL | Freq: Once | OROMUCOSAL | Status: AC

## 2021-12-18 NOTE — Patient Instructions (Addendum)
Your procedure is scheduled on: Tuesday December 19, 2021. Report to Day Surgery inside [facility] 2nd floor, stop by registration desk before getting on elevator. To find out your arrival time please call [phone] between 1PM - 3PM on Monday December 18, 2021.  Remember: Instructions that are not followed completely may result in serious medical risk,  up to and including death, or upon the discretion of your surgeon and anesthesiologist your  surgery may need to be rescheduled.     _X__ 1. Do not eat food or drink fluids after midnight the night before your procedure.                 No chewing gum or hard candies.   __X__2.  On the morning of surgery brush your teeth with toothpaste and water, you                may rinse your mouth with mouthwash if you wish.  Do not swallow any toothpaste or mouthwash.     _X__ 3.  No Alcohol for 24 hours before or after surgery.   _X__ 4.  Do Not Smoke or use e-cigarettes For 24 Hours Prior to Your Surgery.                 Do not use any chewable tobacco products for at least 6 hours prior to                 Surgery.  _X__  5.  Do not use any recreational drugs (marijuana, cocaine, heroin, ecstasy, MDMA or other)                For at least one week prior to your surgery.  Combination of these drugs with anesthesia                May have life threatening results.  ____  6.  Bring all medications with you on the day of surgery if instructed.   __X__  7.  Notify your doctor if there is any change in your medical condition      (cold, fever, infections).     Do not wear jewelry, make-up, hairpins, clips or nail polish. Do not wear lotions, powders, or perfumes. You may wear deodorant. Do not shave 48 hours prior to surgery. Men may shave face and neck. Do not bring valuables to the hospital.    [facility] is not responsible for any belongings or valuables.  Contacts, dentures or bridgework may not be worn into  surgery. Leave your suitcase in the car. After surgery it may be brought to your room. For patients admitted to the hospital, discharge time is determined by your treatment team.   Patients discharged the day of surgery will not be allowed to drive home.   Make arrangements for someone to be with you for the first 24 hours of your Same Day Discharge.   __X__ Take these medicines the morning of surgery with A SIP OF WATER:    1. allopurinol (ZYLOPRIM) 300 MG   2. amLODipine (NORVASC) 2.5 MG   3. atenolol (TENORMIN) 50 MG   4. pantoprazole (PROTONIX) 40 MG  5.   6.  ____ Fleet Enema (as directed)   ____ Use CHG Soap (or wipes) as directed  ____ Use Benzoyl Peroxide Gel as instructed  ____ Use inhalers on the day of surgery  ____ Stop metformin 2 days prior to surgery    ____ Take 1/2 of usual  insulin dose the night before surgery. No insulin the morning          of surgery.   __X__ Stop ELIQUIS 5 MG 2 days prior to your surgery as instructed by your doctor.    __X__ One Week prior to surgery- Stop Anti-inflammatories such as Ibuprofen, Aleve, Advil, Motrin, meloxicam (MOBIC), diclofenac, etodolac, ketorolac, Toradol, Daypro, piroxicam, Goody's or BC powders. OK TO USE TYLENOL IF NEEDED   __X__ Stop supplements until after surgery.    ____ Bring C-Pap to the hospital.    If you have any questions regarding your pre-procedure instructions,  Please call Pre-admit Testing at [phone]

## 2021-12-18 NOTE — Telephone Encounter (Signed)
Patient called in stating that he would like to have his pre admit info sent to him via mychart, I have done so.

## 2021-12-19 ENCOUNTER — Ambulatory Visit: Payer: Medicare Other

## 2021-12-19 ENCOUNTER — Ambulatory Visit: Payer: Medicare Other | Admitting: Urgent Care

## 2021-12-19 ENCOUNTER — Ambulatory Visit
Admission: RE | Admit: 2021-12-19 | Discharge: 2021-12-19 | Disposition: A | Discharge status: Home / Self Care | Payer: Medicare Other | Source: Ambulatory Visit | Attending: Urology | Admitting: Urology

## 2021-12-19 ENCOUNTER — Encounter: Payer: Self-pay | Admitting: Urology

## 2021-12-19 ENCOUNTER — Encounter
Admission: RE | Disposition: A | Discharge status: Home / Self Care | Payer: Self-pay | Source: Ambulatory Visit | Attending: Urology

## 2021-12-19 ENCOUNTER — Ambulatory Visit: Payer: Medicare Other | Admitting: Anesthesiology

## 2021-12-19 ENCOUNTER — Other Ambulatory Visit: Payer: Self-pay

## 2021-12-19 DIAGNOSIS — N202 Calculus of kidney with calculus of ureter: Secondary | ICD-10-CM | POA: Insufficient documentation

## 2021-12-19 DIAGNOSIS — I4891 Unspecified atrial fibrillation: Secondary | ICD-10-CM | POA: Insufficient documentation

## 2021-12-19 DIAGNOSIS — I10 Essential (primary) hypertension: Secondary | ICD-10-CM | POA: Diagnosis not present

## 2021-12-19 DIAGNOSIS — N3289 Other specified disorders of bladder: Secondary | ICD-10-CM | POA: Insufficient documentation

## 2021-12-19 DIAGNOSIS — N132 Hydronephrosis with renal and ureteral calculous obstruction: Secondary | ICD-10-CM

## 2021-12-19 DIAGNOSIS — K219 Gastro-esophageal reflux disease without esophagitis: Secondary | ICD-10-CM | POA: Diagnosis not present

## 2021-12-19 DIAGNOSIS — N201 Calculus of ureter: Secondary | ICD-10-CM

## 2021-12-19 DIAGNOSIS — K449 Diaphragmatic hernia without obstruction or gangrene: Secondary | ICD-10-CM | POA: Diagnosis not present

## 2021-12-19 DIAGNOSIS — Z7901 Long term (current) use of anticoagulants: Secondary | ICD-10-CM | POA: Insufficient documentation

## 2021-12-19 DIAGNOSIS — I482 Chronic atrial fibrillation, unspecified: Secondary | ICD-10-CM

## 2021-12-19 HISTORY — PX: CYSTOSCOPY/RETROGRADE/URETEROSCOPY/STONE EXTRACTION WITH BASKET: SHX5317

## 2021-12-19 HISTORY — PX: CYSTOSCOPY/URETEROSCOPY/HOLMIUM LASER/STENT PLACEMENT: SHX6546

## 2021-12-19 LAB — CBC
HCT: 43.5 % (ref 39.0–52.0)
Hemoglobin: 14.3 g/dL (ref 13.0–17.0)
MCH: 29.4 pg (ref 26.0–34.0)
MCHC: 32.9 g/dL (ref 30.0–36.0)
MCV: 89.3 fL (ref 80.0–100.0)
Platelets: 184 10*3/uL (ref 150–400)
RBC: 4.87 MIL/uL (ref 4.22–5.81)
RDW: 13.7 % (ref 11.5–15.5)
WBC: 4.9 10*3/uL (ref 4.0–10.5)
nRBC: 0 % (ref 0.0–0.2)

## 2021-12-19 SURGERY — CYSTOSCOPY/URETEROSCOPY/HOLMIUM LASER/STENT PLACEMENT
Anesthesia: General | Laterality: Left

## 2021-12-19 MED ORDER — LIDOCAINE HCL (PF) 2 % IJ SOLN
INTRAMUSCULAR | Status: AC
  Filled 2021-12-19: qty 5

## 2021-12-19 MED ORDER — EPHEDRINE SULFATE (PRESSORS) 50 MG/ML IJ SOLN
INTRAMUSCULAR | Status: DC | PRN
  Administered 2021-12-19: 10 mg via INTRAVENOUS
  Administered 2021-12-19: 5 mg via INTRAVENOUS

## 2021-12-19 MED ORDER — ROCURONIUM BROMIDE 100 MG/10ML IV SOLN
INTRAVENOUS | Status: DC | PRN
  Administered 2021-12-19: 10 mg via INTRAVENOUS
  Administered 2021-12-19: 50 mg via INTRAVENOUS
  Administered 2021-12-19: 10 mg via INTRAVENOUS

## 2021-12-19 MED ORDER — SUGAMMADEX SODIUM 200 MG/2ML IV SOLN
INTRAVENOUS | Status: DC | PRN
  Administered 2021-12-19: 200 mg via INTRAVENOUS

## 2021-12-19 MED ORDER — CEFAZOLIN SODIUM-DEXTROSE 2-4 GM/100ML-% IV SOLN
INTRAVENOUS | Status: AC
  Filled 2021-12-19: qty 100

## 2021-12-19 MED ORDER — OXYBUTYNIN CHLORIDE 5 MG PO TABS: ORAL_TABLET | ORAL | 0 refills | Status: DC

## 2021-12-19 MED ORDER — FENTANYL CITRATE (PF) 100 MCG/2ML IJ SOLN: 25.0000 ug | INTRAMUSCULAR | Status: DC | PRN

## 2021-12-19 MED ORDER — OXYCODONE-ACETAMINOPHEN 5-325 MG PO TABS: 1.0000 | ORAL_TABLET | Freq: Four times a day (QID) | ORAL | 0 refills | Status: DC | PRN

## 2021-12-19 MED ORDER — CHLORHEXIDINE GLUCONATE 0.12 % MT SOLN
OROMUCOSAL | Status: AC
  Administered 2021-12-19: 15 mL via OROMUCOSAL
  Filled 2021-12-19: qty 15

## 2021-12-19 MED ORDER — SUGAMMADEX SODIUM 200 MG/2ML IV SOLN: INTRAVENOUS | Status: DC | PRN

## 2021-12-19 MED ORDER — PROPOFOL 10 MG/ML IV BOLUS
INTRAVENOUS | Status: DC | PRN
  Administered 2021-12-19: 200 mg via INTRAVENOUS

## 2021-12-19 MED ORDER — FENTANYL CITRATE (PF) 100 MCG/2ML IJ SOLN
INTRAMUSCULAR | Status: DC | PRN
  Administered 2021-12-19 (×4): 50 ug via INTRAVENOUS

## 2021-12-19 MED ORDER — FENTANYL CITRATE (PF) 100 MCG/2ML IJ SOLN
INTRAMUSCULAR | Status: AC
  Filled 2021-12-19: qty 2

## 2021-12-19 MED ORDER — CHLORHEXIDINE GLUCONATE 0.12 % MT SOLN
OROMUCOSAL | Status: AC
  Filled 2021-12-19: qty 15

## 2021-12-19 MED ORDER — DEXMEDETOMIDINE HCL IN NACL 80 MCG/20ML IV SOLN
INTRAVENOUS | Status: DC | PRN
  Administered 2021-12-19: 4 ug via BUCCAL

## 2021-12-19 MED ORDER — PROPOFOL 10 MG/ML IV BOLUS
INTRAVENOUS | Status: AC
  Filled 2021-12-19: qty 20

## 2021-12-19 MED ORDER — IOHEXOL 180 MG/ML  SOLN
INTRAMUSCULAR | Status: DC | PRN
  Administered 2021-12-19: 10 mL

## 2021-12-19 MED ORDER — SODIUM CHLORIDE 0.9 % IR SOLN
Status: DC | PRN
  Administered 2021-12-19: 3000 mL

## 2021-12-19 MED ORDER — ONDANSETRON HCL 4 MG/2ML IJ SOLN
INTRAMUSCULAR | Status: DC | PRN
  Administered 2021-12-19: 4 mg via INTRAVENOUS

## 2021-12-19 MED ORDER — LIDOCAINE HCL (CARDIAC) PF 100 MG/5ML IV SOSY
PREFILLED_SYRINGE | INTRAVENOUS | Status: DC | PRN
  Administered 2021-12-19: 100 mg via INTRAVENOUS

## 2021-12-19 MED ORDER — DEXAMETHASONE SODIUM PHOSPHATE 10 MG/ML IJ SOLN
INTRAMUSCULAR | Status: DC | PRN
  Administered 2021-12-19: 10 mg via INTRAVENOUS

## 2021-12-19 SURGICAL SUPPLY — 30 items
BAG DRAIN SIEMENS DORNER NS (MISCELLANEOUS) ×1 IMPLANT
BAG DRN NS LF (MISCELLANEOUS) ×1
BASKET ZERO TIP 1.9FR (BASKET) IMPLANT
BRUSH SCRUB EZ 1% IODOPHOR (MISCELLANEOUS) ×1 IMPLANT
BSKT STON RTRVL ZERO TP 1.9FR (BASKET) ×1
CATH URET FLEX-TIP 2 LUMEN 10F (CATHETERS) IMPLANT
CATH URETL OPEN END 6X70 (CATHETERS) IMPLANT
CNTNR SPEC 2.5X3XGRAD LEK (MISCELLANEOUS)
CONT SPEC 4OZ STER OR WHT (MISCELLANEOUS)
CONT SPEC 4OZ STRL OR WHT (MISCELLANEOUS)
CONTAINER SPEC 2.5X3XGRAD LEK (MISCELLANEOUS) IMPLANT
DRAPE UTILITY 15X26 TOWEL STRL (DRAPES) ×1 IMPLANT
FIBER LASER MOSES 200 DFL (Laser) IMPLANT
GLOVE SURG UNDER POLY LF SZ7.5 (GLOVE) ×1 IMPLANT
GOWN STRL REUS W/ TWL LRG LVL3 (GOWN DISPOSABLE) ×1 IMPLANT
GOWN STRL REUS W/ TWL XL LVL3 (GOWN DISPOSABLE) ×1 IMPLANT
GOWN STRL REUS W/TWL LRG LVL3 (GOWN DISPOSABLE) ×1
GOWN STRL REUS W/TWL XL LVL3 (GOWN DISPOSABLE) ×1
GUIDEWIRE STR DUAL SENSOR (WIRE) ×1 IMPLANT
IV NS IRRIG 3000ML ARTHROMATIC (IV SOLUTION) ×1 IMPLANT
KIT TURNOVER CYSTO (KITS) ×1 IMPLANT
PACK CYSTO AR (MISCELLANEOUS) ×1 IMPLANT
SET CYSTO W/LG BORE CLAMP LF (SET/KITS/TRAYS/PACK) ×1 IMPLANT
SHEATH NAVIGATOR HD 12/14X36 (SHEATH) IMPLANT
STENT URET 6FRX24 CONTOUR (STENTS) IMPLANT
STENT URET 6FRX26 CONTOUR (STENTS) IMPLANT
SURGILUBE 2OZ TUBE FLIPTOP (MISCELLANEOUS) ×1 IMPLANT
TRAP FLUID SMOKE EVACUATOR (MISCELLANEOUS) ×1 IMPLANT
VALVE UROSEAL ADJ ENDO (VALVE) IMPLANT
WATER STERILE IRR 500ML POUR (IV SOLUTION) ×1 IMPLANT

## 2021-12-19 NOTE — Interval H&P Note (Signed)
History and Physical Interval Note:  CV: RRR Lungs: Clear  All questions were answered and he desires to proceed.  Will attempt to clear his left renal calculi in addition to his ureteral stone and he is in agreement.  12/19/2021 3:14 PM  James Santiago  has presented today for surgery, with the diagnosis of Left Ureteral Stone.  The various methods of treatment have been discussed with the patient and family. After consideration of risks, benefits and other options for treatment, the patient has consented to  Procedure(s): CYSTOSCOPY/URETEROSCOPY/HOLMIUM LASER/STENT PLACEMENT (Left) as a surgical intervention.  The patient's history has been reviewed, patient examined, no change in status, stable for surgery.  I have reviewed the patient's chart and labs.  Questions were answered to the patient's satisfaction.     [place]

## 2021-12-19 NOTE — Anesthesia Preprocedure Evaluation (Signed)
Anesthesia Evaluation  Patient identified by MRN, date of birth, ID band Patient awake    Reviewed: Allergy & Precautions, H&P , NPO status , Patient's Chart, lab work & pertinent test results  History of Anesthesia Complications Negative for: history of anesthetic complications  Airway Mallampati: II  TM Distance: >3 FB Neck ROM: Full    Dental no notable dental hx. (+) Edentulous Upper, Edentulous Lower   Pulmonary neg pulmonary ROS, neg sleep apnea, neg COPD, Patient abstained from smoking.Not current smoker, former smoker CXR: large hiatal hernia   Pulmonary exam normal breath sounds clear to auscultation       Cardiovascular Exercise Tolerance: Good METShypertension, Pt. on medications (-) angina (-) CAD, (-) Past MI and (-) DOE Normal cardiovascular exam+ dysrhythmias Atrial Fibrillation (-) Valvular Problems/Murmurs Rhythm:Irregular Rate:Normal - Systolic murmurs ECG 04-20-63-7: NSR   Neuro/Psych  Neuromuscular disease  negative psych ROS   GI/Hepatic Neg liver ROS, hiatal hernia,GERD  Medicated and Controlled,,  Endo/Other  negative endocrine ROSneg diabetes    Renal/GU negative Renal ROS  negative genitourinary   Musculoskeletal negative musculoskeletal ROS (+)    Abdominal   Peds negative pediatric ROS (+)  Hematology negative hematology ROS (+)   Anesthesia Other Findings Past Medical History: No date: A-fib [facility])     Comment:  a. Dx 01/2012, CHADS2 = 1.-[place] in past. 06/2003: Adenomatous colon polyp No date: Arthritis     Comment:  "fingers" (02/01/2012) No date: Atrial fibrillation ([place]) No date: Biliary dyskinesia No date: Cholelithiasis     Comment:  "still got 2 small stones in there" (02/01/2012) No date: Esophageal stricture No date: GERD (gastroesophageal reflux disease) No date: Gout No date: Hemorrhoids No date: Hiatal hernia No date: History of colon polyps No date:  Hypercholesteremia No date: Hypertension No date: Kidney stone No date: Nephrolithiasis     Comment:  "was told kidney function at 65%"- hx. kidney stones  Reproductive/Obstetrics negative OB ROS                             Anesthesia Physical Anesthesia Plan  ASA: 3  Anesthesia Plan: General   Post-op Pain Management:    Induction: Intravenous  PONV Risk Score and Plan: 3 and Ondansetron and Dexamethasone  Airway Management Planned: Oral ETT and LMA  Additional Equipment: None  Intra-op Plan:   Post-operative Plan: Extubation in OR  Informed Consent: I have reviewed the patients History and Physical, chart, labs and discussed the procedure including the risks, benefits and alternatives for the proposed anesthesia with the patient or authorized representative who has indicated his/her understanding and acceptance.     Dental advisory given  Plan Discussed with: CRNA  Anesthesia Plan Comments: (Discussed risks of anesthesia with patient, including PONV, sore throat, lip/dental damage. Rare risks discussed as well, such as cardiorespiratory and neurological sequelae. Patient understands.  Last vomited yesterday. No nausea today.)        Anesthesia Quick Evaluation

## 2021-12-19 NOTE — Transfer of Care (Signed)
Immediate Anesthesia Transfer of Care Note  Patient: James Santiago  Procedure(s) Performed: CYSTOSCOPY/URETEROSCOPY/HOLMIUM LASER/STENT PLACEMENT (Left) CYSTOSCOPY/RETROGRADE/URETEROSCOPY/STONE EXTRACTION WITH BASKET (Left)  Patient Location: PACU  Anesthesia Type:General  Level of Consciousness: sedated  Airway & Oxygen Therapy: Patient Spontanous Breathing and Patient connected to nasal cannula oxygen  Post-op Assessment: Report given to RN and Post -op Vital signs reviewed and stable  Post vital signs: Reviewed and stable  Last Vitals:  Vitals Value Taken Time  BP 149/100   Temp    Pulse 70   Resp 20   SpO2 100     Last Pain:  Vitals:   12/19/21 1205  TempSrc: Temporal  PainSc: 0-No pain         Complications: No notable events documented.

## 2021-12-19 NOTE — Anesthesia Procedure Notes (Signed)
Procedure Name: Intubation Date/Time: 12/19/2021 3:42 PM  Performed by: Donnie Mesa, RNPre-anesthesia Checklist: Patient identified, Patient being monitored, Timeout performed, Emergency Drugs available and Suction available Patient Re-evaluated:Patient Re-evaluated prior to induction Oxygen Delivery Method: Circle system utilized Preoxygenation: Pre-oxygenation with 100% oxygen Induction Type: IV induction Ventilation: Mask ventilation without difficulty Laryngoscope Size: Mac and 4 Grade View: Grade I Tube type: Oral Tube size: 7.0 mm Number of attempts: 1 Airway Equipment and Method: Stylet Placement Confirmation: ETT inserted through vocal cords under direct vision, positive ETCO2 and breath sounds checked- equal and bilateral Secured at: 23 cm Tube secured with: Tape Dental Injury: Teeth and Oropharynx as per pre-operative assessment

## 2021-12-19 NOTE — Discharge Instructions (Addendum)
AMBULATORY SURGERY  DISCHARGE INSTRUCTIONS   The drugs that you were given will stay in your system until tomorrow so for the next 24 hours you should not:  Drive an automobile Make any legal decisions Drink any alcoholic beverage   You may resume regular meals tomorrow.  Today it is better to start with liquids and gradually work up to solid foods.  You may eat anything you prefer, but it is better to start with liquids, then soup and crackers, and gradually work up to solid foods.   Please notify your doctor immediately if you have any unusual bleeding, trouble breathing, redness and pain at the surgery site, drainage, fever, or pain not relieved by medication.    Additional Instructions:  DISCHARGE INSTRUCTIONS FOR KIDNEY STONE/URETERAL STENT   MEDICATIONS:  1. Resume all your other meds from home.  2.  AZO (over-the-counter) can help with the burning/stinging when you urinate. 3.  Oxycodone is for moderate/severe pain, Rx was sent to your pharmacy. 4.  Rx oxybutynin for stent irritation was sent to your pharmacy  ACTIVITY:  1. May resume regular activities in 24 hours. 2. No driving while on narcotic pain medications  3. Drink plenty of water  4. Continue to walk at home - you can still get blood clots when you are at home, so keep active, but don't over do it.  5. May return to work/school tomorrow or when you feel ready    SIGNS/SYMPTOMS TO CALL:  Common postoperative symptoms include urinary frequency, urgency, bladder spasm and blood in the urine  Please call us if you have a fever greater than 101.5, uncontrolled nausea/vomiting, uncontrolled pain, dizziness, unable to urinate, excessively bloody urine, chest pain, shortness of breath, leg swelling, leg pain, or any other concerns or questions.   You can reach [place] at [phone].   FOLLOW-UP:  1. You have a follow-up appointment scheduled 12/29/2021 for stent removal     Please contact your physician with  any problems or Same Day Surgery at [phone], Monday through Friday 6 am to 4 pm, or [place] at [facility] number at [phone].

## 2021-12-19 NOTE — Anesthesia Postprocedure Evaluation (Signed)
Anesthesia Post Note  Patient: James Santiago  Procedure(s) Performed: CYSTOSCOPY/URETEROSCOPY/HOLMIUM LASER/STENT PLACEMENT (Left) CYSTOSCOPY/RETROGRADE/URETEROSCOPY/STONE EXTRACTION WITH BASKET (Left)  Patient location during evaluation: PACU Anesthesia Type: General Level of consciousness: awake and alert Pain management: pain level controlled Vital Signs Assessment: post-procedure vital signs reviewed and stable Respiratory status: spontaneous breathing, nonlabored ventilation, respiratory function stable and patient connected to nasal cannula oxygen Cardiovascular status: blood pressure returned to baseline and stable Postop Assessment: no apparent nausea or vomiting Anesthetic complications: no   No notable events documented.   Last Vitals:  Vitals:   12/19/21 1205 12/19/21 1721  BP: (!) 151/98 (!) 145/94  Pulse: 72 80  Resp: 18 10  Temp: (!) 36.3 C 36.6 C  SpO2: 100% 95%    Last Pain:  Vitals:   12/19/21 1721  TempSrc:   PainSc: 0-No pain                 Ilene Qua

## 2021-12-19 NOTE — Op Note (Signed)
Preoperative diagnosis:  Left ureteral calculus Left nephrolithiasis  Postoperative diagnosis:  Same  Procedure:  Cystoscopy Left ureteroscopy and stone removal Ureteroscopic laser lithotripsy Left ureteral stent placement (57F/26 cm) Left retrograde pyelography with interpretation Intraoperative fluoroscopy < 30 minutes  Surgeon: Nicki Reaper C. Azaiah Licciardi, M.D.  Anesthesia: General  Complications: None  Intraoperative findings:  Cystoscopy-urethra normal in caliber without stricture; prostate with mild lateral lobe enlargement and bladder neck elevation.  Bladder mucosa without erythema, solid or papillary lesions.  Moderate trabeculation present. Ureteropyeloscopy-1 cm mid ureteral calculus identified.  Ureter proximal to calculus was dilated and no abnormalities were identified.  Pyeloscopy showed no mucosal abnormalities.  Calculi were identified and dusted at a setting of 0.3J/80 Hz with a 200 m Moses holmium laser fiber: Lower calyceal calculi measuring 10 mm, 8 mm, 8 mm Mid calyceal calculus x2 measuring 5 mm Upper calyceal calculi x2 measuring 5 mm Left retrograde pyelography post procedure showed no filling defects, stone fragments or contrast extravasation  EBL: Minimal  Specimens: None   Indication: James Santiago is a 71 y.o. male seen last week with left lower quadrant abdominal pain.  KUB showed a 7 x 12 mm left mid ureteral calculus and multiple left renal calculi.  After reviewing the management options for treatment, the patient elected to proceed with the above surgical procedure(s). We have discussed the potential benefits and risks of the procedure, side effects of the proposed treatment, the likelihood of the patient achieving the goals of the procedure, and any potential problems that might occur during the procedure or recuperation. Informed consent has been obtained.  Description of procedure:  The patient was taken to the operating room and general anesthesia  was induced.  The patient was placed in the dorsal lithotomy position, prepped and draped in the usual sterile fashion, and preoperative antibiotics were administered. A preoperative time-out was performed.   A 21 French cystoscope was lubricated, inserted per urethra and advanced proximally under direct vision with findings as described above.   Attention was directed to the left ureteral orifice and a 0.038 Sensor wire was then advanced up the ureter into the renal pelvis under fluoroscopic guidance.  A 4.5 Fr semirigid ureteroscope was then advanced into the ureter next to the guidewire and the calculus was identified as described above.  The stone was then dusted with a 200 m Moses holmium laser fiber on a setting of 0.3J/80 Hz  Due to stone hardness several larger fragments chipped off the main stone during dusting and were then removed from the ureter with a zero tip nitinol basket.  Reinspection of the ureter revealed no remaining visible stones or fragments.   The semirigid ureteroscope was removed and an 8 [place] single channel digital flexible ureteroscope was passed per urethra.  The scope was easily inserted into the left UO alongside the guidewire and advanced proximally into the renal pelvis with findings as described above.  All calculi identified were treated as described above.  Retrograde pyelogram was then performed through the ureteroscope and all calyces were examined under fluoroscopic guidance.  Additional noncontact laser lithotripsy was performed in several calyces at a setting of 0.6J/40 Hz until there were no fragments identified larger than the typical laser fiber.  Moderate left hydronephrosis was noted.  The ureteroscope was removed under direct vision and no ureteral fragments were identified.  A 6 F/26 cm Contour ureteral stent was placed under fluoroscopic guidance.  The wire was then removed with an adequate stent curl noted in  the and of all calyx as well as in  the bladder.  The bladder was then emptied and the procedure ended.  The patient appeared to tolerate the procedure well and without complications.  After anesthetic reversal the patient was transported to the PACU in stable condition.   Plan: Scheduled for stent removal in office 12/29/2021.   John Giovanni, MD

## 2021-12-20 ENCOUNTER — Other Ambulatory Visit: Payer: Self-pay | Admitting: *Deleted

## 2021-12-20 ENCOUNTER — Encounter: Payer: Self-pay | Admitting: Urology

## 2021-12-20 DIAGNOSIS — N201 Calculus of ureter: Secondary | ICD-10-CM

## 2021-12-26 ENCOUNTER — Other Ambulatory Visit: Payer: Self-pay | Admitting: Family Medicine

## 2021-12-26 NOTE — Telephone Encounter (Signed)
Requested Prescriptions  Pending Prescriptions Disp Refills   simvastatin (ZOCOR) 10 MG tablet [Pharmacy Med Name: Simvastatin 10 MG Oral Tablet] 90 tablet 0    Sig: TAKE 1 TABLET BY MOUTH AT  BEDTIME     Cardiovascular:  Antilipid - Statins Failed - 12/26/2021  5:52 AM      Failed - Lipid Panel in normal range within the last 12 months    Cholesterol  Date Value Ref Range Status  05/20/2020 101 <200 mg/dL Final   LDL Cholesterol (Calc)  Date Value Ref Range Status  05/20/2020 48 mg/dL (calc) Final    Comment:    Reference range: <100 . Desirable range <100 mg/dL for primary prevention;   <70 mg/dL for patients with CHD or diabetic patients  with > or = 2 CHD risk factors. Marland Kitchen LDL-C is now calculated using the Martin-Hopkins  calculation, which is a validated novel method providing  better accuracy than the Friedewald equation in the  estimation of LDL-C.  Cresenciano Genre et al. Annamaria Helling. 0093;818(29): 2061-2068  (http://education.[facility].com/faq/FAQ164)    HDL  Date Value Ref Range Status  05/20/2020 39 (L) > OR = 40 mg/dL Final   Triglycerides  Date Value Ref Range Status  05/20/2020 56 <150 mg/dL Final         Passed - Patient is not pregnant      Passed - Valid encounter within last 12 months    Recent Outpatient Visits           9 months ago Mucopurulent chronic bronchitis ([place])   [place] Pickard, Cammie Mcgee, MD   1 year ago [place] Pickard, Cammie Mcgee, MD   1 year ago [place] Susy Frizzle, MD   1 year ago Stage 3 chronic kidney disease, unspecified whether stage 3a or 3b CKD ([place])   [place] Pickard, Cammie Mcgee, MD   1 year ago History of gout   [place] Pickard, Cammie Mcgee, MD       Future Appointments             In 1 month Stoioff, Ronda Fairly, MD [place]             allopurinol (ZYLOPRIM)  300 MG tablet [Pharmacy Med Name: Allopurinol 300 MG Oral Tablet] 90 tablet 0    Sig: TAKE 1 TABLET BY MOUTH DAILY     Endocrinology:  Gout Agents - allopurinol Failed - 12/26/2021  5:52 AM      Failed - Uric Acid in normal range and within 360 days    Uric Acid, Serum  Date Value Ref Range Status  07/12/2020 4.0 4.0 - 8.0 mg/dL Final    Comment:    Therapeutic target for gout patients: <6.0 mg/dL .          Failed - Cr in normal range and within 360 days    Creat  Date Value Ref Range Status  07/12/2020 1.49 (H) 0.70 - 1.25 mg/dL Final    Comment:    For patients >53 years of age, the reference limit for Creatinine is approximately 13% higher for people identified as African-American. .    Creatinine, Ser  Date Value Ref Range Status  10/31/2020 1.50 (H) 0.61 - 1.24 mg/dL Final         Failed - CBC within normal limits and completed in the last 12 months  WBC  Date Value Ref Range Status  12/19/2021 4.9 4.0 - 10.5 K/uL Final   RBC  Date Value Ref Range Status  12/19/2021 4.87 4.22 - 5.81 MIL/uL Final   Hemoglobin  Date Value Ref Range Status  12/19/2021 14.3 13.0 - 17.0 g/dL Final   HCT  Date Value Ref Range Status  12/19/2021 43.5 39.0 - 52.0 % Final   MCHC  Date Value Ref Range Status  12/19/2021 32.9 30.0 - 36.0 g/dL Final   [place]  Date Value Ref Range Status  12/19/2021 29.4 26.0 - 34.0 pg Final   MCV  Date Value Ref Range Status  12/19/2021 89.3 80.0 - 100.0 fL Final   No results found for: "PLTCOUNTKUC", "LABPLAT", "POCPLA" RDW  Date Value Ref Range Status  12/19/2021 13.7 11.5 - 15.5 % Final         Passed - Valid encounter within last 12 months    Recent Outpatient Visits           9 months ago Mucopurulent chronic bronchitis ([place])   [place] [facility] Pickard, Cammie Mcgee, MD   1 year ago [place] Susy Frizzle, MD   1 year ago [place] Susy Frizzle, MD   1 year ago Stage 3 chronic kidney disease, unspecified whether stage 3a or 3b CKD ([place])   [place] Pickard, Cammie Mcgee, MD   1 year ago History of gout   [place] Pickard, Cammie Mcgee, MD       Future Appointments             In 1 month Stoioff, Ronda Fairly, MD [place]

## 2021-12-29 ENCOUNTER — Ambulatory Visit
Admission: RE | Admit: 2021-12-29 | Discharge: 2021-12-29 | Disposition: A | Discharge status: Home / Self Care | Payer: Medicare Other | Attending: Urology | Admitting: Urology

## 2021-12-29 ENCOUNTER — Encounter: Payer: Self-pay | Admitting: Urology

## 2021-12-29 ENCOUNTER — Ambulatory Visit (INDEPENDENT_AMBULATORY_CARE_PROVIDER_SITE_OTHER): Payer: Medicare Other | Admitting: Urology

## 2021-12-29 ENCOUNTER — Ambulatory Visit
Admission: RE | Admit: 2021-12-29 | Discharge: 2021-12-29 | Disposition: A | Discharge status: Home / Self Care | Payer: Medicare Other | Source: Ambulatory Visit | Attending: Urology | Admitting: Urology

## 2021-12-29 VITALS — BP 126/86 | HR 88 | Ht 74.0 in | Wt 238.0 lb

## 2021-12-29 DIAGNOSIS — Z466 Encounter for fitting and adjustment of urinary device: Secondary | ICD-10-CM

## 2021-12-29 DIAGNOSIS — N201 Calculus of ureter: Secondary | ICD-10-CM

## 2021-12-29 LAB — URINALYSIS, COMPLETE

## 2021-12-29 LAB — MICROSCOPIC EXAMINATION: RBC, Urine: >30 /hpf — AB (ref 0–2)

## 2021-12-29 MED ORDER — CIPROFLOXACIN HCL 500 MG PO TABS
500.0000 mg | ORAL_TABLET | Freq: Once | ORAL | Status: AC
  Administered 2021-12-29: 500 mg via ORAL

## 2021-12-29 NOTE — Progress Notes (Signed)
Indications: Patient is 71 y.o., who is s/p ureteroscopic removal of a 10 mm left mid ureteral calculus and multiple left renal calculi 12/19/2021.  He had no postoperative problems.  The patient is presenting today for stent removal.  Procedure:  Flexible Cystoscopy with stent removal (79390)  Timeout was performed and the correct patient, procedure and participants were identified.    Description:  The patient was prepped and draped in the usual sterile fashion. Flexible cystosopy was performed.  Stent visualization was difficult secondary to murky urine.  Urine was aspirated from the bladder with a syringe.  The stent was then visualized, grasped with endoscopic forceps and removed the patient tolerated the procedure well.  A single dose of oral antibiotics was given.  Complications:  None  Plan:  Instructed to call for fever/flank pain post stent removal Recommend metabolic evaluation He has a follow-up appointment December 2023 and would like to discuss metabolic evaluation further at that visit   John Giovanni, MD

## 2022-01-24 ENCOUNTER — Ambulatory Visit
Admission: RE | Admit: 2022-01-24 | Discharge: 2022-01-24 | Disposition: A | Discharge status: Home / Self Care | Payer: Medicare Other | Source: Ambulatory Visit | Attending: Urology | Admitting: Urology

## 2022-01-24 ENCOUNTER — Ambulatory Visit
Admission: RE | Admit: 2022-01-24 | Discharge: 2022-01-24 | Disposition: A | Discharge status: Home / Self Care | Payer: Medicare Other | Attending: Urology | Admitting: Urology

## 2022-01-24 ENCOUNTER — Other Ambulatory Visit: Payer: Self-pay | Admitting: *Deleted

## 2022-01-24 DIAGNOSIS — N201 Calculus of ureter: Secondary | ICD-10-CM

## 2022-01-25 ENCOUNTER — Ambulatory Visit: Payer: Medicare Other | Admitting: Urology

## 2022-01-25 ENCOUNTER — Encounter: Payer: Self-pay | Admitting: Urology

## 2022-01-25 VITALS — BP 128/82 | HR 80 | Ht 74.0 in | Wt 238.0 lb

## 2022-01-25 DIAGNOSIS — N200 Calculus of kidney: Secondary | ICD-10-CM

## 2022-01-25 DIAGNOSIS — Z87442 Personal history of urinary calculi: Secondary | ICD-10-CM | POA: Diagnosis not present

## 2022-01-25 NOTE — Patient Instructions (Signed)

## 2022-01-25 NOTE — Progress Notes (Signed)
01/25/2022 2:09 PM   James Santiago 1951/01/21 [id number]  Referring provider: Susy Frizzle, MD [address] [place],  [place] [address]  Chief Complaint  Patient presents with   Nephrolithiasis    Urologic history:  1.  Recurrent nephrolithiasis Prior SWL and ureteroscopy in the remote past Ureteroscopic removal of a 10 mm right proximal ureteral calculus and multiple right renal calculi 07/19/2020 Stone analysis CaOxMono/CaOxDi 85/88 Previous metabolic evaluation performed but results unknown; declined repeat Multiple nonobstructing left renal calculi Left ureteroscopy laser lithotripsy of a 1 cm left mid ureteral calculus and multiple left renal calculi 12/19/2021; stent removed 12/29/2021 Stone analysis CaOxMono/CaOxDi 90/10   HPI: 71 y.o. male presents for 68-monthfollow-up.  No problems since stent removal No bothersome LUTS Denies dysuria, gross hematuria Denies flank, abdominal or pelvic pain KUB today personally reviewed and interpreted.  No calcifications suspicious for urinary tract stones identified over the renal outlines or expected course of the ureters  PMH: Past Medical History:  Diagnosis Date   A-fib ([facility]    a. Dx 01/2012, CHADS2 = 1.-[facility] in past.   Adenomatous colon polyp 06/2003   Arthritis    "fingers" (02/01/2012)   Atrial fibrillation ([facility]    Biliary dyskinesia    Cholelithiasis    "still got 2 small stones in there" (02/01/2012)   Dysrhythmia    Esophageal stricture    GERD (gastroesophageal reflux disease)    Gout    Hemorrhoids    Hiatal hernia    History of colon polyps    History of kidney stones    Hypercholesteremia    Hypertension    Kidney stone    Nephrolithiasis    "was told kidney function at 65%"- hx. kidney stones    Surgical History: Past Surgical History:  Procedure Laterality Date   CARDIOVERSION  03/06/2012   Procedure: CARDIOVERSION;  Surgeon: DLarey Dresser MD;  [place] [facility]   Service: Cardiovascular;  Laterality: N/A;   CHOLECYSTECTOMY N/A 11/13/2012   Procedure: LAPAROSCOPIC CHOLECYSTECTOMY;  Surgeon: MRolm Bookbinder MD;  Location[facility];  Service: General;  Laterality: N/A;   COLONOSCOPY     CYSTOSCOPY W/ URETEROSCOPY W/ LITHOTRIPSY  1980's   CYSTOSCOPY WITH RETROGRADE PYELOGRAM, URETEROSCOPY AND STENT PLACEMENT  1980's   CYSTOSCOPY/RETROGRADE/URETEROSCOPY/STONE EXTRACTION WITH BASKET Left 12/19/2021   Procedure: CYSTOSCOPY/RETROGRADE/URETEROSCOPY/STONE EXTRACTION WITH BASKET;  Surgeon: SAbbie Sons MD;  Location: [facility];  Service: Urology;  Laterality: Left;   CYSTOSCOPY/URETEROSCOPY/HOLMIUM LASER/STENT PLACEMENT Right 07/06/2020   Procedure: CYSTOSCOPY/URETEROSCOPY/STENT PLACEMENT;  Surgeon: SAbbie Sons MD;  Location: [facility];  Service: Urology;  Laterality: Right;   CYSTOSCOPY/URETEROSCOPY/HOLMIUM LASER/STENT PLACEMENT Right 07/19/2020   Procedure: CYSTOSCOPY/URETEROSCOPY/HOLMIUM LASER/STENT EXCHANGE;  Surgeon: SAbbie Sons MD;  Location: [facility];  Service: Urology;  Laterality: Right;   CYSTOSCOPY/URETEROSCOPY/HOLMIUM LASER/STENT PLACEMENT Left 12/19/2021   Procedure: CYSTOSCOPY/URETEROSCOPY/HOLMIUM LASER/STENT PLACEMENT;  Surgeon: SAbbie Sons MD;  Location: [facility];  Service: Urology;  Laterality: Left;   ESOPHAGEAL DILATION     INGUINAL HERNIA REPAIR  ? 2008   "right" (02/01/2012)   POLYPECTOMY     VASECTOMY      Home Medications:  Allergies as of 01/25/2022       Reactions   Amoxicillin Diarrhea   Augmentin [amoxicillin-pot Clavulanate]    Diarrhea and sick        Medication List        Accurate as of January 25, 2022  2:09 PM. If you have any questions, ask your  nurse or doctor.          allopurinol 300 MG tablet Commonly known as: ZYLOPRIM TAKE 1 TABLET BY MOUTH DAILY   amLODipine 2.5 MG tablet Commonly known as: NORVASC TAKE 1 TABLET BY MOUTH  DAILY   atenolol 50 MG tablet Commonly known as:  TENORMIN TAKE 1 TABLET BY MOUTH DAILY   calcium carbonate 750 MG chewable tablet Commonly known as: TUMS EX Chew 1 tablet by mouth daily as needed for indigestion or heartburn.   dexlansoprazole 60 MG capsule Commonly known as: DEXILANT Take 1 capsule by mouth daily.   doxazosin 4 MG tablet Commonly known as: CARDURA TAKE 1 TABLET BY MOUTH  DAILY   Eliquis 5 MG Tabs tablet Generic drug: apixaban TAKE 1 TABLET BY MOUTH  TWICE DAILY   famotidine 20 MG tablet Commonly known as: PEPCID Take 20 mg by mouth daily as needed for heartburn or indigestion.   fluticasone 50 MCG/ACT nasal spray Commonly known as: FLONASE SPRAY 2 SPRAYS INTO EACH NOSTRIL EVERY DAY   lidocaine-prilocaine cream Commonly known as: EMLA Apply 1 application topically as needed.   ondansetron 4 MG disintegrating tablet Commonly known as: ZOFRAN-ODT Take 1 tablet (4 mg total) by mouth every 8 (eight) hours as needed for nausea or vomiting.   oxybutynin 5 MG tablet Commonly known as: DITROPAN 1 tab tid prn frequency,urgency, bladder spasm   oxyCODONE-acetaminophen 5-325 MG tablet Commonly known as: PERCOCET/ROXICET Take 1 tablet by mouth every 6 (six) hours as needed for severe pain.   pantoprazole 40 MG tablet Commonly known as: PROTONIX Take 1 tablet (40 mg total) by mouth daily.   simethicone 125 MG chewable tablet Commonly known as: MYLICON Chew 174 mg by mouth every 6 (six) hours as needed for flatulence.   simvastatin 10 MG tablet Commonly known as: ZOCOR TAKE 1 TABLET BY MOUTH AT  BEDTIME   telmisartan 80 MG tablet Commonly known as: MICARDIS TAKE 1 TABLET BY MOUTH IN  THE MORNING        Allergies:  Allergies  Allergen Reactions   Amoxicillin Diarrhea   Augmentin [Amoxicillin-Pot Clavulanate]     Diarrhea and sick    Family History: Family History  Problem Relation Age of Onset   Lung cancer Father        died @ 6.  Also had PPM   Heart disease Father    Kidney disease  Mother        died @ 44. Also had h/o CVA, breat cancer, diabetes, and PPM   Breast cancer Mother    Diabetes Mother    Heart disease Mother    Cancer Brother        esophagus   Esophageal cancer Brother    Colon cancer Neg Hx    Rectal cancer Neg Hx    Stomach cancer Neg Hx     Social History:  reports that he has quit smoking. His smoking use included cigarettes. He has a 6.00 pack-year smoking history. His smokeless tobacco use includes chew. He reports that he does not currently use alcohol. He reports that he does not use drugs.   Physical Exam: BP 128/82   Pulse 80   Ht '6\' 2"'$  (1.88 m)   Wt 238 lb (108 kg)   BMI 30.56 kg/m   Constitutional:  Alert and oriented, No acute distress. HEENT: [place] AT, moist mucus membranes.  Trachea midline, no masses. Cardiovascular: No clubbing, cyanosis, or edema. Respiratory: Normal respiratory effort, no increased work of  breathing. Psychiatric: Normal mood and affect.   Pertinent Imaging: KUB images reviewed and interpreted on today's x-ray.     Assessment & Plan:    1.  Recurrent nephrolithiasis No definite calculi seen on today's KUB We discussed a metabolic evaluation at his last visit and he desires to pursue.  Will contact with the results once they return 1 year follow-up with [place], MD  [facility] [address], [place], [place] [address] [phone]

## 2022-02-02 ENCOUNTER — Other Ambulatory Visit: Payer: Self-pay | Admitting: Family Medicine

## 2022-02-02 ENCOUNTER — Other Ambulatory Visit: Payer: Self-pay

## 2022-02-02 DIAGNOSIS — I10 Essential (primary) hypertension: Secondary | ICD-10-CM

## 2022-02-02 MED ORDER — AMLODIPINE BESYLATE 2.5 MG PO TABS: 2.5000 mg | ORAL_TABLET | Freq: Every day | ORAL | 3 refills | Status: DC

## 2022-02-21 ENCOUNTER — Encounter: Payer: Self-pay | Admitting: Family Medicine

## 2022-02-23 ENCOUNTER — Other Ambulatory Visit: Payer: Self-pay | Admitting: Family Medicine

## 2022-02-23 MED ORDER — LIDOCAINE-PRILOCAINE 2.5-2.5 % EX CREA: 1.0000 | TOPICAL_CREAM | CUTANEOUS | 0 refills | Status: DC | PRN

## 2022-02-26 ENCOUNTER — Other Ambulatory Visit: Payer: Self-pay | Admitting: Family Medicine

## 2022-02-26 DIAGNOSIS — I10 Essential (primary) hypertension: Secondary | ICD-10-CM

## 2022-02-26 NOTE — Telephone Encounter (Signed)
Patient called and advised he's due for a CPE appointment. He says he will call back once he checks his schedule. I advised that he will need to schedule so that he can continue to receive the refills. He asks will they not be refilled, advised a courtesy 30 day could be given, but not a 90 day 1 year supply as requested by the pharmacy until he's been seen by Dr. Dennard Schaumann. Patient verbalized understanding and says he will call back.

## 2022-02-26 NOTE — Telephone Encounter (Signed)
Requested medication (s) are due for refill today: Yes  Requested medication (s) are on the active medication list: Yes  Last refill:  08/16/21  Future visit scheduled: No  Notes to clinic:  Unable to refill per protocol, appointment needed, no updated labs.     Requested Prescriptions  Pending Prescriptions Disp Refills   doxazosin (CARDURA) 4 MG tablet [Pharmacy Med Name: Doxazosin Mesylate 4 MG Oral Tablet] 90 tablet 3    Sig: TAKE 1 TABLET BY MOUTH  DAILY     Cardiovascular:  Alpha Blockers Failed - 02/26/2022  5:15 AM      Failed - Valid encounter within last 6 months    Recent Outpatient Visits           11 months ago Mucopurulent chronic bronchitis ([place])   [place] Pickard, Cammie Mcgee, MD   1 year ago Rhinosinusitis   [place] Pickard, Cammie Mcgee, MD   1 year ago [place] Susy Frizzle, MD   1 year ago Stage 3 chronic kidney disease, unspecified whether stage 3a or 3b CKD ([place])   [place] Pickard, Cammie Mcgee, MD   1 year ago History of gout   [place] Pickard, Cammie Mcgee, MD       Future Appointments             In 11 months Stoioff, Ronda Fairly, MD [place] BP in normal range    BP Readings from Last 1 Encounters:  01/25/22 128/82          atenolol (TENORMIN) 50 MG tablet [Pharmacy Med Name: Atenolol 50 MG Oral Tablet] 90 tablet 3    Sig: TAKE 1 TABLET BY MOUTH DAILY     Cardiovascular: Beta Blockers 2 Failed - 02/26/2022  5:15 AM      Failed - Cr in normal range and within 360 days    Creat  Date Value Ref Range Status  07/12/2020 1.49 (H) 0.70 - 1.25 mg/dL Final    Comment:    For patients >28 years of age, the reference limit for Creatinine is approximately 13% higher for people identified as African-American. .    Creatinine, Ser  Date Value Ref Range Status  10/31/2020 1.50 (H) 0.61 -  1.24 mg/dL Final         Failed - Valid encounter within last 6 months    Recent Outpatient Visits           11 months ago Mucopurulent chronic bronchitis ([place])   [facility] Pickard, Cammie Mcgee, MD   1 year ago [place] Pickard, Cammie Mcgee, MD   1 year ago [place] Susy Frizzle, MD   1 year ago Stage 3 chronic kidney disease, unspecified whether stage 3a or 3b CKD ([place])   [place] Pickard, Cammie Mcgee, MD   1 year ago History of gout   [place], Cammie Mcgee, MD       Future Appointments             In 11 months Stoioff, Ronda Fairly, MD [place] BP in normal range    BP Readings from Last  1 Encounters:  01/25/22 128/82         Passed - Last Heart Rate in normal range    Pulse Readings from Last 1 Encounters:  01/25/22 80

## 2022-02-27 ENCOUNTER — Other Ambulatory Visit: Payer: Self-pay

## 2022-02-27 DIAGNOSIS — I10 Essential (primary) hypertension: Secondary | ICD-10-CM

## 2022-02-27 MED ORDER — DOXAZOSIN MESYLATE 4 MG PO TABS: 4.0000 mg | ORAL_TABLET | Freq: Every day | ORAL | 0 refills | Status: DC

## 2022-02-27 MED ORDER — ATENOLOL 50 MG PO TABS: 50.0000 mg | ORAL_TABLET | Freq: Every day | ORAL | 0 refills | Status: DC

## 2022-03-07 ENCOUNTER — Other Ambulatory Visit: Payer: Self-pay | Admitting: Family Medicine

## 2022-03-26 ENCOUNTER — Encounter: Payer: Self-pay | Admitting: Family Medicine

## 2022-03-26 ENCOUNTER — Ambulatory Visit: Payer: Medicare Other | Admitting: Family Medicine

## 2022-03-26 VITALS — BP 138/86 | HR 99 | Temp 98.2°F | Ht 74.0 in | Wt 243.2 lb

## 2022-03-26 DIAGNOSIS — J329 Chronic sinusitis, unspecified: Secondary | ICD-10-CM | POA: Diagnosis not present

## 2022-03-26 MED ORDER — CEFDINIR 300 MG PO CAPS: 300.0000 mg | ORAL_CAPSULE | Freq: Two times a day (BID) | ORAL | 0 refills | Status: DC

## 2022-03-26 MED ORDER — LIDOCAINE-PRILOCAINE 2.5-2.5 % EX CREA: 1.0000 | TOPICAL_CREAM | CUTANEOUS | 0 refills | Status: DC | PRN

## 2022-03-26 NOTE — Progress Notes (Signed)
Subjective:    Patient ID: James Santiago, male    DOB: 1950-07-09, 72 y.o.   MRN: [id number] Patient states that he has been battling his sinuses for more than 3 weeks.  He was diagnosed with COVID 3 weeks ago.  He initially improved however approximately 2 and half weeks ago he developed pain and pressure in both frontal sinuses.  He reports postnasal drainage, copious bloody rhinorrhea, headaches, postnasal drip, and cough productive of yellow and green mucus. Past Medical History:  Diagnosis Date  . A-fib ([place])    a. Dx 01/2012, CHADS2 = 1.-[facility] in past.  . Adenomatous colon polyp 06/2003  . Arthritis    "fingers" (02/01/2012)  . Atrial fibrillation ([place])   . Biliary dyskinesia   . Cholelithiasis    "still got 2 small stones in there" (02/01/2012)  . Dysrhythmia   . Esophageal stricture   . GERD (gastroesophageal reflux disease)   . Gout   . Hemorrhoids   . Hiatal hernia   . History of colon polyps   . History of kidney stones   . Hypercholesteremia   . Hypertension   . Kidney stone   . Nephrolithiasis    "was told kidney function at 65%"- hx. kidney stones    Current Outpatient Medications on File Prior to Visit  Medication Sig Dispense Refill  . allopurinol (ZYLOPRIM) 300 MG tablet TAKE 1 TABLET BY MOUTH DAILY 90 tablet 3  . amLODipine (NORVASC) 2.5 MG tablet Take 1 tablet (2.5 mg total) by mouth daily. 90 tablet 3  . atenolol (TENORMIN) 50 MG tablet Take 1 tablet (50 mg total) by mouth daily. 90 tablet 0  . calcium carbonate (TUMS EX) 750 MG chewable tablet Chew 1 tablet by mouth daily as needed for indigestion or heartburn.    . dexlansoprazole (DEXILANT) 60 MG capsule TAKE 1 CAPSULE BY MOUTH EVERY DAY 90 capsule 3  . doxazosin (CARDURA) 4 MG tablet Take 1 tablet (4 mg total) by mouth daily. 90 tablet 0  . ELIQUIS 5 MG TABS tablet TAKE 1 TABLET BY MOUTH  TWICE DAILY 180 tablet 3  . famotidine (PEPCID) 20 MG tablet Take 20 mg by mouth daily as needed for heartburn  or indigestion.    . pantoprazole (PROTONIX) 40 MG tablet Take 1 tablet (40 mg total) by mouth daily. 90 tablet 3  . simvastatin (ZOCOR) 10 MG tablet TAKE 1 TABLET BY MOUTH AT  BEDTIME 90 tablet 3  . telmisartan (MICARDIS) 80 MG tablet TAKE 1 TABLET BY MOUTH IN  THE MORNING 90 tablet 3   No current facility-administered medications on file prior to visit.   Allergies  Allergen Reactions  . Amoxicillin Diarrhea  . Augmentin [Amoxicillin-Pot Clavulanate]     Diarrhea and sick   Social History   Socioeconomic History  . Marital status: Married    Spouse name: [name]  . Number of children: 1  . Years of education: Not on file  . Highest education level: Not on file  Occupational History    Employer: UNEMPLOYED  Tobacco Use  . Smoking status: Former    Packs/day: 0.50    Years: 12.00    Total pack years: 6.00    Types: Cigarettes  . Smokeless tobacco: Current    Types: Chew  . Tobacco comments:    02/01/2012 smoked cigarettes "probably 30 yr ago."  Currently uses 1/2 pouch of chewing tobacco daily; offered cessation materials; pt declines.  Vaping Use  . Vaping Use: Never used  Substance and Sexual Activity  . Alcohol use: Not Currently    Alcohol/week: 0.0 standard drinks of alcohol    Comment: occ. beer  . Drug use: No  . Sexual activity: Not Currently  Other Topics Concern  . Not on file  Social History Narrative   Lives in [place] with wife.  They have one grown daughter, 2 grandsons and 1 granddaughter.  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   Social Determinants of Health   Financial Resource Strain: Low Risk  (06/01/2021)   Overall Financial Resource Strain (CARDIA)   . Difficulty of Paying Living Expenses: Not hard at all  Food Insecurity: No Food Insecurity (06/01/2021)   Hunger Vital Sign   . Worried About Charity fundraiser in the Last Year: Never true   . Ran Out of Food in the Last Year: Never true  Transportation Needs: No  Transportation Needs (06/01/2021)   PRAPARE - Transportation   . Lack of Transportation (Medical): No   . Lack of Transportation (Non-Medical): No  Physical Activity: Sufficiently Active (06/01/2021)   Exercise Vital Sign   . Days of Exercise per Week: 5 days   . Minutes of Exercise per Session: 30 min  Stress: No Stress Concern Present (06/01/2021)   [place]   . Feeling of Stress : Not at all  Social Connections: Moderately Isolated (06/01/2021)   Social Connection and Isolation Panel [NHANES]   . Frequency of Communication with Friends and Family: More than three times a week   . Frequency of Social Gatherings with Friends and Family: More than three times a week   . Attends Religious Services: Never   . Active Member of Clubs or Organizations: No   . Attends Archivist Meetings: Never   . Marital Status: Married  Human resources officer Violence: Not At Risk (06/01/2021)   Humiliation, Afraid, Rape, and Kick questionnaire   . Fear of Current or Ex-Partner: No   . Emotionally Abused: No   . Physically Abused: No   . Sexually Abused: No   Family History  Problem Relation Age of Onset  . Lung cancer Father        died @ 12.  Also had PPM  . Heart disease Father   . Kidney disease Mother        died @ 60. Also had h/o CVA, breat cancer, diabetes, and PPM  . Breast cancer Mother   . Diabetes Mother   . Heart disease Mother   . Cancer Brother        esophagus  . Esophageal cancer Brother   . Colon cancer Neg Hx   . Rectal cancer Neg Hx   . Stomach cancer Neg Hx       Review of Systems     Objective:   Physical Exam Vitals reviewed.  Constitutional:      Appearance: Normal appearance. He is normal weight.  HENT:     Right Ear: Tympanic membrane and ear canal normal.     Left Ear: Tympanic membrane and ear canal normal.     Nose: Congestion and rhinorrhea present.     Right Turbinates: Swollen.      Left Turbinates: Swollen.     Right Sinus: Maxillary sinus tenderness and frontal sinus tenderness present.     Left Sinus: Maxillary sinus tenderness and frontal sinus tenderness present.     Mouth/Throat:     Mouth:  Mucous membranes are moist.     Pharynx: Oropharynx is clear. No oropharyngeal exudate or posterior oropharyngeal erythema.  Eyes:     Extraocular Movements: Extraocular movements intact.     Conjunctiva/sclera: Conjunctivae normal.     Pupils: Pupils are equal, round, and reactive to light.  Cardiovascular:     Rate and Rhythm: Normal rate.     Heart sounds: Normal heart sounds. No murmur heard.    No friction rub. No gallop.  Pulmonary:     Effort: Pulmonary effort is normal. No respiratory distress.     Breath sounds: No decreased air movement. No decreased breath sounds, wheezing, rhonchi or rales.  Neurological:     Mental Status: He is alert.          Assessment & Plan:  Rhinosinusitis I believe the patient has developed a secondary sinus infection.  Treat that with Omnicef 300 mg twice daily for 10 days.  Continue use Robitussin DM for cough.

## 2022-04-12 ENCOUNTER — Encounter: Payer: Self-pay | Admitting: Family Medicine

## 2022-04-12 ENCOUNTER — Ambulatory Visit (INDEPENDENT_AMBULATORY_CARE_PROVIDER_SITE_OTHER): Payer: Medicare Other | Admitting: Family Medicine

## 2022-04-12 VITALS — BP 124/72 | HR 82 | Temp 98.4°F | Ht 74.0 in | Wt 245.2 lb

## 2022-04-12 DIAGNOSIS — G629 Polyneuropathy, unspecified: Secondary | ICD-10-CM

## 2022-04-12 DIAGNOSIS — Z8739 Personal history of other diseases of the musculoskeletal system and connective tissue: Secondary | ICD-10-CM

## 2022-04-12 DIAGNOSIS — Z23 Encounter for immunization: Secondary | ICD-10-CM | POA: Diagnosis not present

## 2022-04-12 DIAGNOSIS — Z0001 Encounter for general adult medical examination with abnormal findings: Secondary | ICD-10-CM

## 2022-04-12 DIAGNOSIS — N183 Chronic kidney disease, stage 3 unspecified: Secondary | ICD-10-CM | POA: Diagnosis not present

## 2022-04-12 DIAGNOSIS — Z0000 Encounter for general adult medical examination without abnormal findings: Secondary | ICD-10-CM

## 2022-04-12 DIAGNOSIS — I10 Essential (primary) hypertension: Secondary | ICD-10-CM | POA: Diagnosis not present

## 2022-04-12 DIAGNOSIS — E7800 Pure hypercholesterolemia, unspecified: Secondary | ICD-10-CM

## 2022-04-12 DIAGNOSIS — I4891 Unspecified atrial fibrillation: Secondary | ICD-10-CM

## 2022-04-12 DIAGNOSIS — Z125 Encounter for screening for malignant neoplasm of prostate: Secondary | ICD-10-CM

## 2022-04-12 MED ORDER — GABAPENTIN 300 MG PO CAPS: 300.0000 mg | ORAL_CAPSULE | Freq: Three times a day (TID) | ORAL | 3 refills | Status: DC | PRN

## 2022-04-12 NOTE — Progress Notes (Signed)
Subjective:    Patient ID: James Santiago, male    DOB: 04/16/1950, 72 y.o.   MRN: [id number]  HPI  Patient is a very pleasant 72 year old Caucasian male here today for complete physical exam.  Past medical history is significant for chronic kidney disease stage III, HTN, atrial fibrillation, HLD, as well as history of gout.  Patient reports neuropathy.  He reports burning stinging pain in both feet occasionally.  He is due for Prevnar 20.  He had Pneumovax 23.  He is due for a flu shot, COVID shot, and an RSV shot.  He declines these.  Shingrix is up-to-date.  He is due for a tetanus shot.  Colonoscopy is due again in 2028. Immunization History  Administered Date(s) Administered   Influenza,inj,Quad PF,6+ Mos 03/07/2015   PFIZER Comirnaty(Gray Top)Covid-19 Tri-Sucrose Vaccine 04/12/2020   PFIZER(Purple Top)SARS-COV-2 Vaccination 06/24/2019, 07/21/2019   Pneumococcal Polysaccharide-23 05/26/2020   Tdap 02/15/2011   Zoster, Live 09/15/2013   Past Medical History:  Diagnosis Date   A-fib ([place])    a. Dx 01/2012, CHADS2 = 1.-[place] in past.   Adenomatous colon polyp 06/2003   Arthritis    "fingers" (02/01/2012)   Atrial fibrillation ([place])    Biliary dyskinesia    Cholelithiasis    "still got 2 small stones in there" (02/01/2012)   Dysrhythmia    Esophageal stricture    GERD (gastroesophageal reflux disease)    Gout    Hemorrhoids    Hiatal hernia    History of colon polyps    History of kidney stones    Hypercholesteremia    Hypertension    Kidney stone    Nephrolithiasis    "was told kidney function at 65%"- hx. kidney stones   Current Outpatient Medications on File Prior to Visit  Medication Sig Dispense Refill   allopurinol (ZYLOPRIM) 300 MG tablet TAKE 1 TABLET BY MOUTH DAILY 90 tablet 3   amLODipine (NORVASC) 2.5 MG tablet Take 1 tablet (2.5 mg total) by mouth daily. 90 tablet 3   atenolol (TENORMIN) 50 MG tablet Take 1 tablet (50 mg total) by mouth daily. 90 tablet  0   calcium carbonate (TUMS EX) 750 MG chewable tablet Chew 1 tablet by mouth daily as needed for indigestion or heartburn.     cefdinir (OMNICEF) 300 MG capsule Take 1 capsule (300 mg total) by mouth 2 (two) times daily. 20 capsule 0   dexlansoprazole (DEXILANT) 60 MG capsule TAKE 1 CAPSULE BY MOUTH EVERY DAY 90 capsule 3   doxazosin (CARDURA) 4 MG tablet Take 1 tablet (4 mg total) by mouth daily. 90 tablet 0   ELIQUIS 5 MG TABS tablet TAKE 1 TABLET BY MOUTH  TWICE DAILY 180 tablet 3   famotidine (PEPCID) 20 MG tablet Take 20 mg by mouth daily as needed for heartburn or indigestion.     lidocaine-prilocaine (EMLA) cream Apply 1 Application topically as needed. 30 g 0   pantoprazole (PROTONIX) 40 MG tablet Take 1 tablet (40 mg total) by mouth daily. 90 tablet 3   simvastatin (ZOCOR) 10 MG tablet TAKE 1 TABLET BY MOUTH AT  BEDTIME 90 tablet 3   telmisartan (MICARDIS) 80 MG tablet TAKE 1 TABLET BY MOUTH IN  THE MORNING 90 tablet 3   No current facility-administered medications on file prior to visit.   Allergies  Allergen Reactions   Amoxicillin Diarrhea   Augmentin [Amoxicillin-Pot Clavulanate]     Diarrhea and sick   Social History   Socioeconomic History  Marital status: Married    Spouse name: [name]   Number of children: 1   Years of education: Not on file   Highest education level: Not on file  Occupational History    Employer: UNEMPLOYED  Tobacco Use   Smoking status: Former    Packs/day: 0.50    Years: 12.00    Total pack years: 6.00    Types: Cigarettes   Smokeless tobacco: Current    Types: Chew   Tobacco comments:    02/01/2012 smoked cigarettes "probably 30 yr ago."  Currently uses 1/2 pouch of chewing tobacco daily; offered cessation materials; pt declines.  Vaping Use   Vaping Use: Never used  Substance and Sexual Activity   Alcohol use: Not Currently    Alcohol/week: 0.0 standard drinks of alcohol    Comment: occ. beer   Drug use: No   Sexual activity: Not  Currently  Other Topics Concern   Not on file  Social History Narrative   Lives in [place] with wife.  They have one grown daughter, 2 grandsons and 1 granddaughter.  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   Social Determinants of Health   Financial Resource Strain: Low Risk  (06/01/2021)   Overall Financial Resource Strain (CARDIA)    Difficulty of Paying Living Expenses: Not hard at all  Food Insecurity: No Food Insecurity (06/01/2021)   Hunger Vital Sign    Worried About Running Out of Food in the Last Year: Never true    Ran Out of Food in the Last Year: Never true  Transportation Needs: No Transportation Needs (06/01/2021)   PRAPARE - Hydrologist (Medical): No    Lack of Transportation (Non-Medical): No  Physical Activity: Sufficiently Active (06/01/2021)   Exercise Vital Sign    Days of Exercise per Week: 5 days    Minutes of Exercise per Session: 30 min  Stress: No Stress Concern Present (06/01/2021)   [place]    Feeling of Stress : Not at all  Social Connections: Moderately Isolated (06/01/2021)   Social Connection and Isolation Panel [NHANES]    Frequency of Communication with Friends and Family: More than three times a week    Frequency of Social Gatherings with Friends and Family: More than three times a week    Attends Religious Services: Never    Marine scientist or Organizations: No    Attends Archivist Meetings: Never    Marital Status: Married  Human resources officer Violence: Not At Risk (06/01/2021)   Humiliation, Afraid, Rape, and Kick questionnaire    Fear of Current or Ex-Partner: No    Emotionally Abused: No    Physically Abused: No    Sexually Abused: No   Family History  Problem Relation Age of Onset   Lung cancer Father        died @ 12.  Also had PPM   Heart disease Father    Kidney disease Mother        died @ 4.  Also had h/o CVA, breat cancer, diabetes, and PPM   Breast cancer Mother    Diabetes Mother    Heart disease Mother    Cancer Brother        esophagus   Esophageal cancer Brother    Colon cancer Neg Hx    Rectal cancer Neg Hx    Stomach cancer Neg Hx  Review of Systems  All other systems reviewed and are negative.      Objective:   Physical Exam Vitals reviewed.  Constitutional:      General: He is not in acute distress.    Appearance: He is well-developed. He is not diaphoretic.  HENT:     Head: Normocephalic and atraumatic.     Right Ear: External ear normal.     Left Ear: External ear normal.     Nose: Nose normal.     Mouth/Throat:     Pharynx: No oropharyngeal exudate.  Eyes:     General: No scleral icterus.       Right eye: No discharge.        Left eye: No discharge.     Conjunctiva/sclera: Conjunctivae normal.     Pupils: Pupils are equal, round, and reactive to light.  Neck:     Thyroid: No thyromegaly.     Vascular: No JVD.     Trachea: No tracheal deviation.  Cardiovascular:     Rate and Rhythm: Normal rate and regular rhythm.     Heart sounds: Normal heart sounds. No murmur heard.    No friction rub. No gallop.  Pulmonary:     Effort: Pulmonary effort is normal. No respiratory distress.     Breath sounds: Normal breath sounds. No stridor. No wheezing or rales.  Chest:     Chest wall: No tenderness.  Abdominal:     General: Bowel sounds are normal. There is no distension.     Palpations: Abdomen is soft. There is no mass.     Tenderness: There is no abdominal tenderness. There is no guarding or rebound.  Musculoskeletal:        General: No tenderness. Normal range of motion.     Cervical back: Normal range of motion and neck supple.     Right lower leg: No edema.     Left lower leg: No edema.  Lymphadenopathy:     Cervical: No cervical adenopathy.  Skin:    General: Skin is warm.     Coloration: Skin is not jaundiced or pale.      Findings: No bruising, erythema, lesion or rash.  Neurological:     Mental Status: He is alert and oriented to person, place, and time.     Cranial Nerves: No cranial nerve deficit.     Sensory: No sensory deficit.     Motor: No weakness or abnormal muscle tone.     Coordination: Coordination normal.     Gait: Gait normal.     Deep Tendon Reflexes: Reflexes are normal and symmetric. Reflexes normal.  Psychiatric:        Behavior: Behavior normal.        Thought Content: Thought content normal.        Judgment: Judgment normal.           Assessment & Plan:  Encounter for Medicare annual wellness exam - Plan: CBC with Differential/Platelet, Lipid panel, COMPLETE METABOLIC PANEL WITH GFR, PSA, Uric acid  Stage 3 chronic kidney disease, unspecified whether stage 3a or 3b CKD ([place]) - Plan: CBC with Differential/Platelet, Lipid panel, COMPLETE METABOLIC PANEL WITH GFR, Uric acid, Protein / Creatinine Ratio, Urine  Benign essential HTN - Plan: CBC with Differential/Platelet, Lipid panel, COMPLETE METABOLIC PANEL WITH GFR  Atrial fibrillation, unspecified type ([place])  Pure hypercholesterolemia - Plan: CBC with Differential/Platelet, Lipid panel, COMPLETE METABOLIC PANEL WITH GFR  History of gout - Plan: COMPLETE METABOLIC PANEL WITH  GFR, Uric acid  Prostate cancer screening - Plan: PSA  Neuropathy - Plan: Vitamin B12 Blood pressure today is excellent at 124/72.  Patient received Prevnar 20 today.  We discussed the flu shot, but, RSV, and tetanus.  Colonoscopy is up-to-date.  Check a PSA to screen for prostate cancer.  Patient has quit smoking more than 15 years ago and therefore does not require lung cancer screening.  Check a CBC a CMP and a lipid panel.  Goal LDL cholesterol is less than 100.  Given his history of gout check a uric acid level.  Goal uric acid level is less than 6.  Given his neuropathy check a B12 level.  Try gabapentin 300 mg p.o. 3 times daily as needed nerve pain

## 2022-04-13 LAB — COMPLETE METABOLIC PANEL WITH GFR
AG Ratio: 1.3 (calc) (ref 1.0–2.5)
ALT: 9 U/L (ref 9–46)
AST: 13 U/L (ref 10–35)
Albumin: 3.9 g/dL (ref 3.6–5.1)
Alkaline phosphatase (APISO): 129 U/L (ref 35–144)
BUN/Creatinine Ratio: 8 (calc) (ref 6–22)
BUN: 13 mg/dL (ref 7–25)
CO2: 27 mmol/L (ref 20–32)
Calcium: 9.4 mg/dL (ref 8.6–10.3)
Chloride: 105 mmol/L (ref 98–110)
Creat: 1.53 mg/dL — ABNORMAL HIGH (ref 0.70–1.28)
Globulin: 2.9 g/dL (calc) (ref 1.9–3.7)
Glucose, Bld: 104 mg/dL — ABNORMAL HIGH (ref 65–99)
Potassium: 4.4 mmol/L (ref 3.5–5.3)
Sodium: 140 mmol/L (ref 135–146)
Total Bilirubin: 1.7 mg/dL — ABNORMAL HIGH (ref 0.2–1.2)
Total Protein: 6.8 g/dL (ref 6.1–8.1)
eGFR: 49 mL/min/{1.73_m2} — ABNORMAL LOW (ref >=60)

## 2022-04-13 LAB — CBC WITH DIFFERENTIAL/PLATELET
Absolute Monocytes: 444 cells/uL (ref 200–950)
Basophils Absolute: 62 cells/uL (ref 0–200)
Basophils Relative: 1.4 %
Eosinophils Absolute: 220 cells/uL (ref 15–500)
Eosinophils Relative: 5 %
HCT: 43.2 % (ref 38.5–50.0)
Hemoglobin: 14 g/dL (ref 13.2–17.1)
Lymphs Abs: 1267 cells/uL (ref 850–3900)
MCH: 28.7 pg (ref 27.0–33.0)
MCHC: 32.4 g/dL (ref 32.0–36.0)
MCV: 88.7 fL (ref 80.0–100.0)
MPV: 10.9 fL (ref 7.5–12.5)
Monocytes Relative: 10.1 %
Neutro Abs: 2407 cells/uL (ref 1500–7800)
Neutrophils Relative %: 54.7 %
Platelets: 227 10*3/uL (ref 140–400)
RBC: 4.87 10*6/uL (ref 4.20–5.80)
RDW: 14.2 % (ref 11.0–15.0)
Total Lymphocyte: 28.8 %
WBC: 4.4 10*3/uL (ref 3.8–10.8)

## 2022-04-13 LAB — PSA: PSA: 0.73 ng/mL (ref <=4.00)

## 2022-04-13 LAB — LIPID PANEL
Cholesterol: 133 mg/dL (ref <200)
HDL: 39 mg/dL — ABNORMAL LOW (ref >=40)
LDL Cholesterol (Calc): 75 mg/dL (calc)
Non-HDL Cholesterol (Calc): 94 mg/dL (calc) (ref <130)
Total CHOL/HDL Ratio: 3.4 (calc) (ref <5.0)
Triglycerides: 107 mg/dL (ref <150)

## 2022-04-13 LAB — VITAMIN B12: Vitamin B-12: 380 pg/mL (ref 200–1100)

## 2022-04-13 LAB — PROTEIN / CREATININE RATIO, URINE
Creatinine, Urine: 245 mg/dL (ref 20–320)
Protein/Creat Ratio: 102 mg/g creat (ref 25–148)
Protein/Creatinine Ratio: 0.102 mg/mg creat (ref 0.025–0.148)
Total Protein, Urine: 25 mg/dL (ref 5–25)

## 2022-04-13 LAB — URIC ACID: Uric Acid, Serum: 3.7 mg/dL — ABNORMAL LOW (ref 4.0–8.0)

## 2022-05-28 ENCOUNTER — Other Ambulatory Visit: Payer: Self-pay | Admitting: Family Medicine

## 2022-05-28 DIAGNOSIS — I10 Essential (primary) hypertension: Secondary | ICD-10-CM

## 2022-05-29 ENCOUNTER — Encounter: Payer: Self-pay | Admitting: Family Medicine

## 2022-05-29 ENCOUNTER — Other Ambulatory Visit: Payer: Self-pay

## 2022-05-29 DIAGNOSIS — I10 Essential (primary) hypertension: Secondary | ICD-10-CM

## 2022-05-29 MED ORDER — ATENOLOL 50 MG PO TABS: 50.0000 mg | ORAL_TABLET | Freq: Every day | ORAL | 1 refills | Status: DC

## 2022-05-29 MED ORDER — DOXAZOSIN MESYLATE 4 MG PO TABS: 4.0000 mg | ORAL_TABLET | Freq: Every day | ORAL | 1 refills | Status: DC

## 2022-05-29 NOTE — Telephone Encounter (Signed)
Already ordered today, will refuse this request.   Requested Prescriptions  Pending Prescriptions Disp Refills   atenolol (TENORMIN) 50 MG tablet [Pharmacy Med Name: Atenolol 50 MG Oral Tablet] 90 tablet 3    Sig: TAKE 1 TABLET BY MOUTH DAILY     Cardiovascular: Beta Blockers 2 Failed - 05/28/2022  6:10 AM      Failed - Cr in normal range and within 360 days    Creat  Date Value Ref Range Status  04/12/2022 1.53 (H) 0.70 - 1.28 mg/dL Final   Creatinine, Urine  Date Value Ref Range Status  04/12/2022 245 20 - 320 mg/dL Final         Failed - Valid encounter within last 6 months    Recent Outpatient Visits           1 year ago Mucopurulent chronic bronchitis (HCC)   [facility] Pickard, Priscille Heidelberg, MD   1 year ago Rhinosinusitis   [facility] Pickard, Priscille Heidelberg, MD   1 year ago Rhinosinusitis   [facility] Donita Brooks, MD   1 year ago Stage 3 chronic kidney disease, unspecified whether stage 3a or 3b CKD (HCC)   [facility] Pickard, Priscille Heidelberg, MD   2 years ago History of gout   [facility] Pickard, Priscille Heidelberg, MD       Future Appointments             In 8 months Stoioff, Verna Czech, MD [facility] [place]            Passed - Last BP in normal range    BP Readings from Last 1 Encounters:  04/12/22 124/72         Passed - Last Heart Rate in normal range    Pulse Readings from Last 1 Encounters:  04/12/22 82          doxazosin (CARDURA) 4 MG tablet [Pharmacy Med Name: Doxazosin Mesylate 4 MG Oral Tablet] 90 tablet 3    Sig: TAKE 1 TABLET BY MOUTH DAILY     Cardiovascular:  Alpha Blockers Failed - 05/28/2022  6:10 AM      Failed - Valid encounter within last 6 months    Recent Outpatient Visits           1 year ago Mucopurulent chronic bronchitis (HCC)   [facility] Pickard, Priscille Heidelberg, MD   1 year ago Rhinosinusitis   [facility]  Pickard, Priscille Heidelberg, MD   1 year ago Rhinosinusitis   [facility] Donita Brooks, MD   1 year ago Stage 3 chronic kidney disease, unspecified whether stage 3a or 3b CKD (HCC)   [facility] Donita Brooks, MD   2 years ago History of gout   [facility] Medicine Pickard, Priscille Heidelberg, MD       Future Appointments             In 8 months Stoioff, Verna Czech, MD [facility] [place]            Passed - Last BP in normal range    BP Readings from Last 1 Encounters:  04/12/22 124/72

## 2022-06-07 IMAGING — CT CT RENAL STONE PROTOCOL
2 of 4 series · 16 of 46 positions shown, 18 images · non-contrast
Comparison: 04/24/2017

CLINICAL DATA: Bilateral flank pain gross hematuria for 2 weeks.
Nephrolithiasis.

EXAM:
CT ABDOMEN AND PELVIS WITHOUT CONTRAST
TECHNIQUE: Multidetector CT imaging of the abdomen and pelvis was performed
following the standard protocol without IV contrast.

[Series 2: renal stone 5.00 · axial · 0.82mm/px · z∈[-1600,-1145]mm · 13 of 99 slices shown, 15 images]
[im 4/99  soft-tissue]
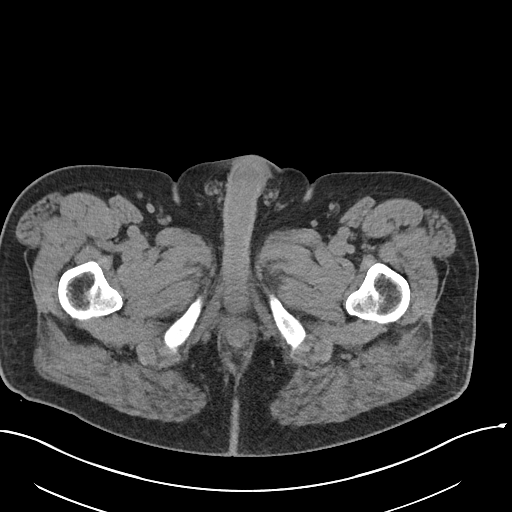
[im 4/99  bone]
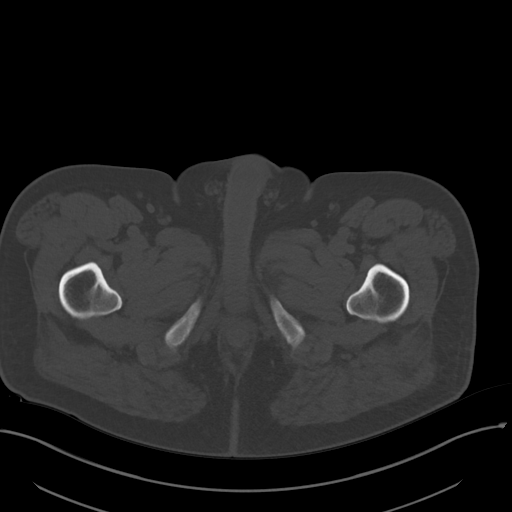
[im 12/99  soft-tissue]
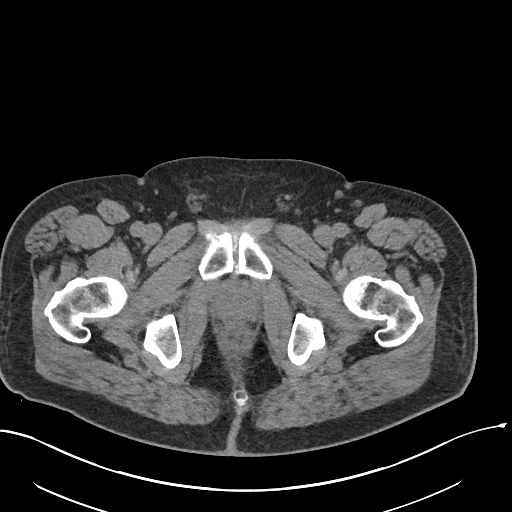
[im 20/99  soft-tissue]
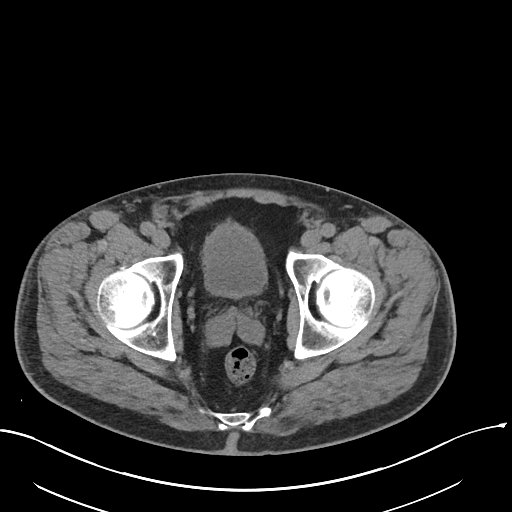
[im 28/99  soft-tissue]
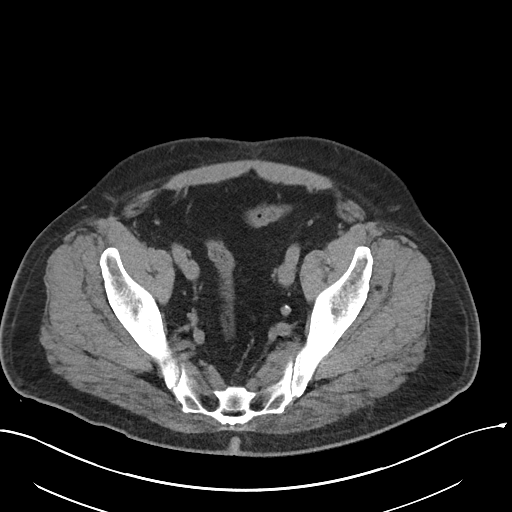
[im 36/99  soft-tissue]
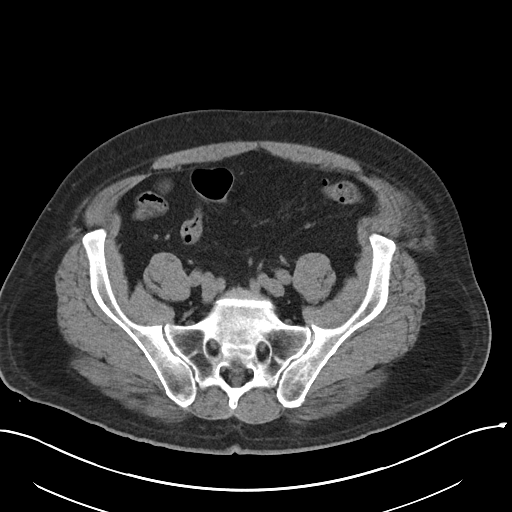
[im 44/99  soft-tissue]
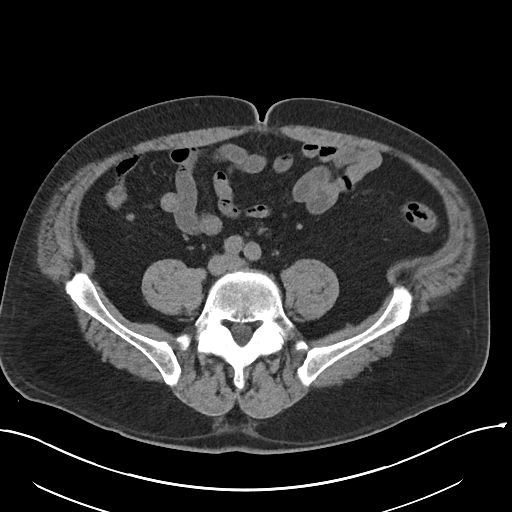
[im 51/99  soft-tissue]
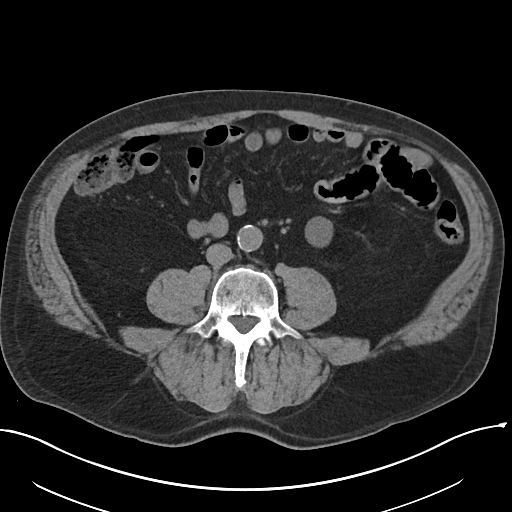
[im 55/99  soft-tissue]
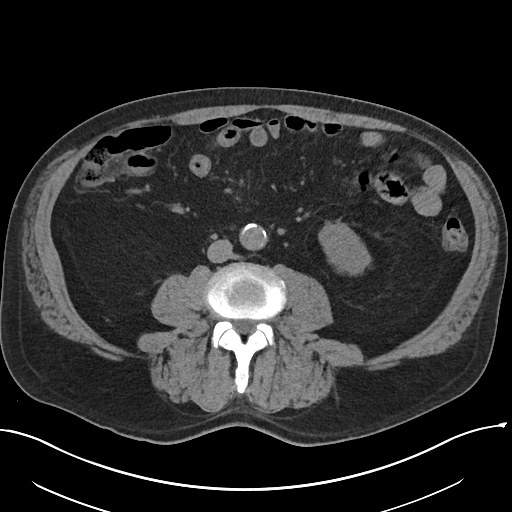
[im 63/99  soft-tissue]
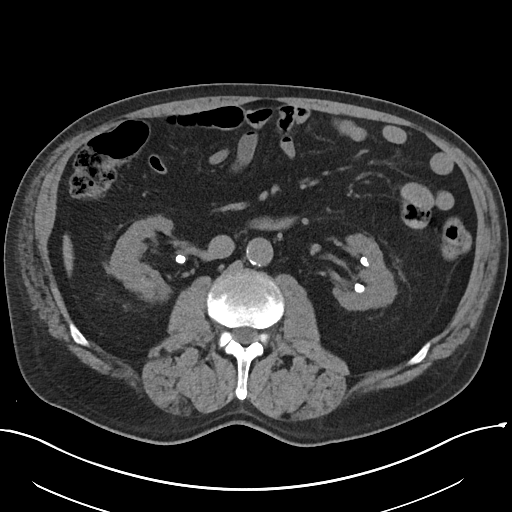
[im 63/99  bone]
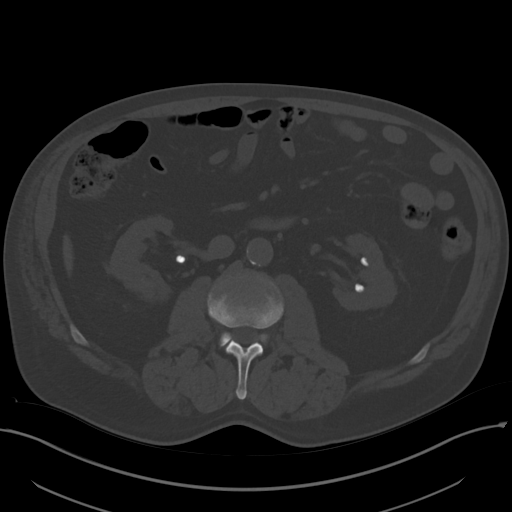
[im 71/99  soft-tissue]
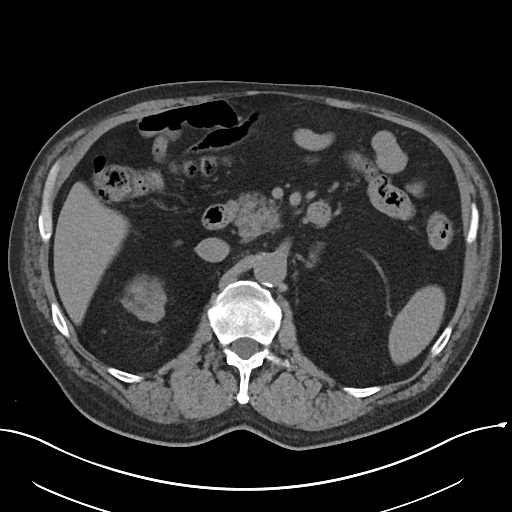
[im 79/99  soft-tissue]
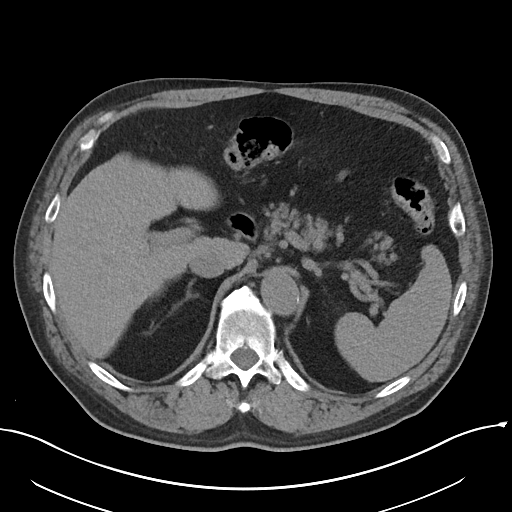
[im 87/99  soft-tissue]
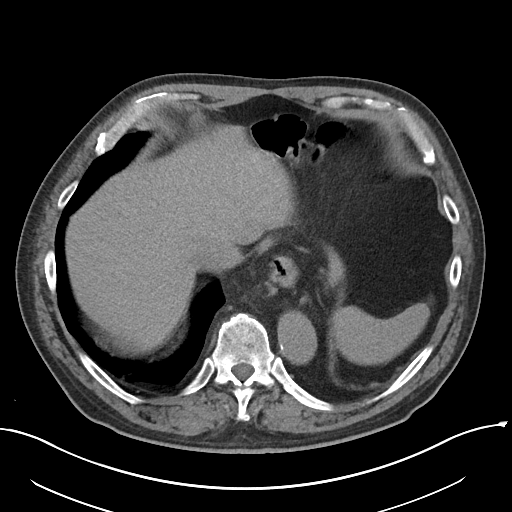
[im 95/99  soft-tissue]
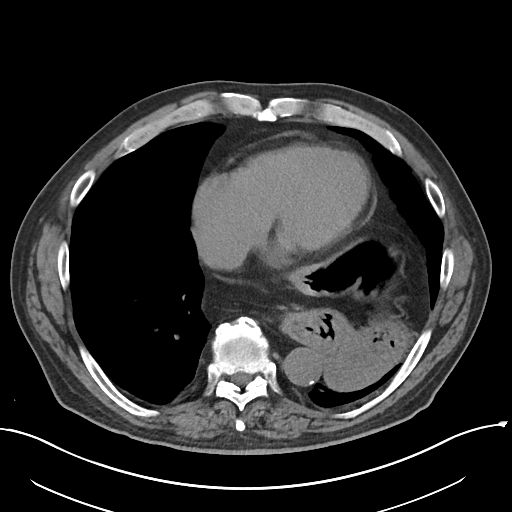

[Series 4: renal stone 2.00 cor · coronal · 0.82mm/px · 3 of 161 slices shown]
[im 54/161  soft-tissue]
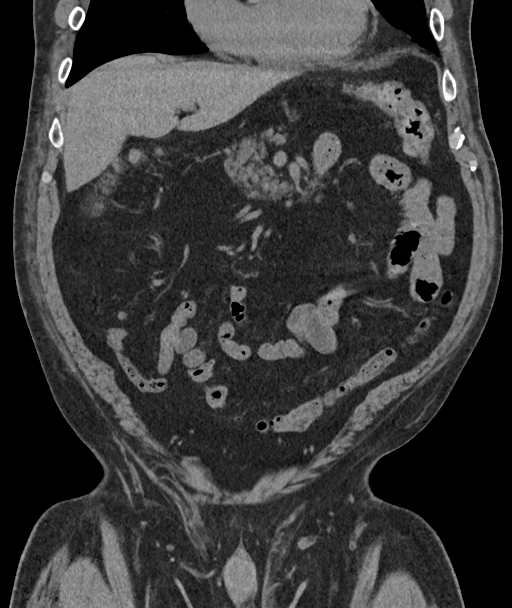
[im 72/161  soft-tissue]
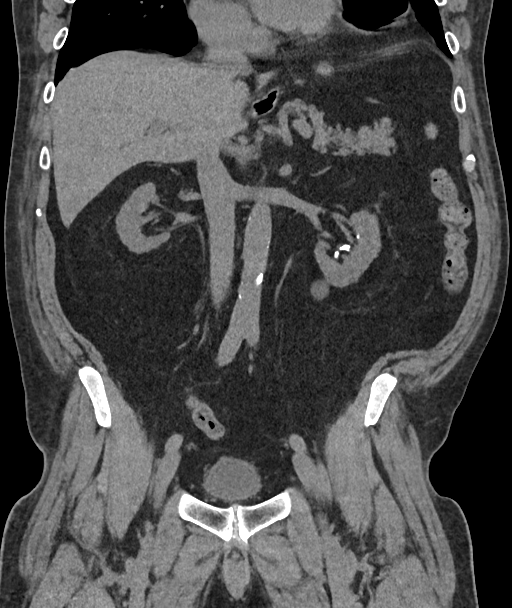
[im 89/161  soft-tissue]
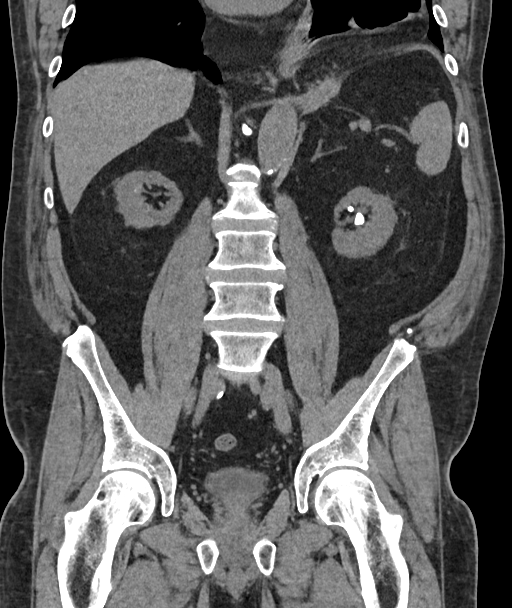

[16 of 46 positions shown; findings below may reference images not displayed]

FINDINGS: Lower chest: No acute findings.

Hepatobiliary: No mass visualized on this unenhanced exam. Prior
cholecystectomy. No evidence of biliary obstruction.

Pancreas: No mass or inflammatory process visualized on this
unenhanced exam.

Spleen:  Within normal limits in size.

Adrenals/Urinary tract: Multiple small less than 1 cm renal calculi
are again seen bilaterally. A 7 mm calculus is now seen in the right
renal pelvis, however there is no evidence of hydronephrosis. No
evidence of ureteral calculi or dilatation.

Stomach/Bowel: A large paraesophageal hiatal hernia is seen which
contains nearly the entire stomach. No evidence of obstruction,
inflammatory process, or abnormal fluid collections. Normal appendix
visualized.

Vascular/Lymphatic: No pathologically enlarged lymph nodes
identified. No evidence of abdominal aortic aneurysm. Aortic
atherosclerotic calcification noted.

Reproductive:  No mass or other significant abnormality.

Other:  None.

Musculoskeletal:  No suspicious bone lesions identified.
IMPRESSION: 7 mm calculus in right renal pelvis, without evidence of ureteral
calculi or hydronephrosis.

Bilateral nephrolithiasis.

Large paraesophageal hiatal hernia containing nearly the entire
stomach.

## 2022-07-20 ENCOUNTER — Other Ambulatory Visit: Payer: Self-pay | Admitting: Family Medicine

## 2022-07-20 NOTE — Telephone Encounter (Signed)
Requested Prescriptions  Pending Prescriptions Disp Refills   ELIQUIS 5 MG TABS tablet [Pharmacy Med Name: Eliquis 5 MG Oral Tablet] 180 tablet 2    Sig: TAKE 1 TABLET BY MOUTH TWICE  DAILY     Hematology:  Anticoagulants - apixaban Failed - 07/20/2022  9:13 AM      Failed - Cr in normal range and within 360 days    Creat  Date Value Ref Range Status  04/12/2022 1.53 (H) 0.70 - 1.28 mg/dL Final   Creatinine, Urine  Date Value Ref Range Status  04/12/2022 245 20 - 320 mg/dL Final         Failed - Valid encounter within last 12 months    Recent Outpatient Visits           1 year ago Mucopurulent chronic bronchitis (HCC)   [facility] Pickard, Priscille Heidelberg, MD   1 year ago Rhinosinusitis   [facility] Pickard, Priscille Heidelberg, MD   1 year ago Rhinosinusitis   [facility] Donita Brooks, MD   2 years ago Stage 3 chronic kidney disease, unspecified whether stage 3a or 3b CKD (HCC)   [facility] Donita Brooks, MD   2 years ago History of gout   [facility] Pickard, Priscille Heidelberg, MD       Future Appointments             In 6 months Stoioff, Verna Czech, MD [facility]             Passed - PLT in normal range and within 360 days    Platelets  Date Value Ref Range Status  04/12/2022 227 140 - 400 Thousand/uL Final         Passed - HGB in normal range and within 360 days    Hemoglobin  Date Value Ref Range Status  04/12/2022 14.0 13.2 - 17.1 g/dL Final         Passed - HCT in normal range and within 360 days    HCT  Date Value Ref Range Status  04/12/2022 43.2 38.5 - 50.0 % Final         Passed - AST in normal range and within 360 days    AST  Date Value Ref Range Status  04/12/2022 13 10 - 35 U/L Final         Passed - ALT in normal range and within 360 days    ALT  Date Value Ref Range Status  04/12/2022 9 9 - 46 U/L Final

## 2022-07-23 ENCOUNTER — Other Ambulatory Visit: Payer: Self-pay

## 2022-07-23 ENCOUNTER — Encounter: Payer: Self-pay | Admitting: Family Medicine

## 2022-07-23 DIAGNOSIS — I482 Chronic atrial fibrillation, unspecified: Secondary | ICD-10-CM

## 2022-07-23 MED ORDER — APIXABAN 5 MG PO TABS: 5.0000 mg | ORAL_TABLET | Freq: Two times a day (BID) | ORAL | 5 refills | Status: DC

## 2022-10-01 ENCOUNTER — Encounter: Payer: Self-pay | Admitting: Family Medicine

## 2022-10-01 ENCOUNTER — Other Ambulatory Visit: Payer: Self-pay | Admitting: Family Medicine

## 2022-10-01 MED ORDER — PRADAXA 150 MG PO PACK: 150.0000 mg | PACK | Freq: Two times a day (BID) | ORAL | 5 refills | Status: DC

## 2022-10-04 ENCOUNTER — Other Ambulatory Visit: Payer: Self-pay

## 2022-10-04 DIAGNOSIS — I482 Chronic atrial fibrillation, unspecified: Secondary | ICD-10-CM

## 2022-10-04 MED ORDER — PRADAXA 150 MG PO PACK
150.0000 mg | PACK | Freq: Two times a day (BID) | ORAL | 5 refills | Status: DC
Start: 2022-10-04 — End: 2023-02-26

## 2022-10-11 ENCOUNTER — Telehealth: Payer: Self-pay

## 2022-10-11 NOTE — Telephone Encounter (Signed)
PA FOR PRADAXA   Otho Ket (Key: [id number])   Your information has been sent to [facility].

## 2022-10-16 ENCOUNTER — Other Ambulatory Visit: Payer: Self-pay | Admitting: Family Medicine

## 2022-10-28 ENCOUNTER — Other Ambulatory Visit: Payer: Self-pay | Admitting: Family Medicine

## 2022-10-30 NOTE — Telephone Encounter (Signed)
Requested Prescriptions  Pending Prescriptions Disp Refills   telmisartan (MICARDIS) 80 MG tablet [Pharmacy Med Name: Telmisartan 80 MG Oral Tablet] 90 tablet 0    Sig: TAKE 1 TABLET BY MOUTH IN THE  MORNING     Cardiovascular:  Angiotensin Receptor Blockers Failed - 10/28/2022 10:43 PM      Failed - Cr in normal range and within 180 days    Creat  Date Value Ref Range Status  04/12/2022 1.53 (H) 0.70 - 1.28 mg/dL Final   Creatinine, Urine  Date Value Ref Range Status  04/12/2022 245 20 - 320 mg/dL Final         Failed - K in normal range and within 180 days    Potassium  Date Value Ref Range Status  04/12/2022 4.4 3.5 - 5.3 mmol/L Final         Failed - Valid encounter within last 6 months    Recent Outpatient Visits           1 year ago Mucopurulent chronic bronchitis (HCC)   [facility] Pickard, Priscille Heidelberg, MD   2 years ago Rhinosinusitis   [facility] Donita Brooks, MD   2 years ago Rhinosinusitis   [facility] Donita Brooks, MD   2 years ago Stage 3 chronic kidney disease, unspecified whether stage 3a or 3b CKD (HCC)   [facility] Medicine Pickard, Priscille Heidelberg, MD   2 years ago History of gout   [facility] Pickard, Priscille Heidelberg, MD       Future Appointments             In 2 months Stoioff, Verna Czech, MD [facility] - Patient is not pregnant      Passed - Last BP in normal range    BP Readings from Last 1 Encounters:  04/12/22 124/72

## 2022-10-31 ENCOUNTER — Telehealth: Payer: Self-pay

## 2022-10-31 NOTE — Telephone Encounter (Signed)
Outcome Approved on August 29 by [facility] Medicare 2017 NCPDP Request Reference Number: [id number]. PRADAXA PAK 150MG  is approved through 02/12/2023. Your patient may now fill this prescription and it will be covered. Authorization Expiration Date: 02/12/2023

## 2022-11-07 ENCOUNTER — Other Ambulatory Visit: Payer: Self-pay | Admitting: Family Medicine

## 2022-11-07 DIAGNOSIS — I10 Essential (primary) hypertension: Secondary | ICD-10-CM

## 2022-11-08 NOTE — Telephone Encounter (Signed)
Requested Prescriptions  Pending Prescriptions Disp Refills   amLODipine (NORVASC) 2.5 MG tablet [Pharmacy Med Name: amLODIPine Besylate 2.5 MG Oral Tablet] 90 tablet 3    Sig: TAKE 1 TABLET BY MOUTH DAILY     Cardiovascular: Calcium Channel Blockers 2 Failed - 11/07/2022 11:09 PM      Failed - Valid encounter within last 6 months    Recent Outpatient Visits           1 year ago Mucopurulent chronic bronchitis (HCC)   [facility] Pickard, Priscille Heidelberg, MD   2 years ago Rhinosinusitis   [facility] Medicine Tanya Nones, Priscille Heidelberg, MD   2 years ago Rhinosinusitis   [facility] Donita Brooks, MD   2 years ago Stage 3 chronic kidney disease, unspecified whether stage 3a or 3b CKD (HCC)   [facility] Pickard, Priscille Heidelberg, MD   2 years ago History of gout   [facility] Pickard, Priscille Heidelberg, MD       Future Appointments             In 2 months Stoioff, Verna Czech, MD [facility] [place]            Passed - Last BP in normal range    BP Readings from Last 1 Encounters:  04/12/22 124/72         Passed - Last Heart Rate in normal range    Pulse Readings from Last 1 Encounters:  04/12/22 82

## 2022-11-12 ENCOUNTER — Encounter: Payer: Self-pay | Admitting: Family Medicine

## 2022-11-12 ENCOUNTER — Other Ambulatory Visit: Payer: Self-pay | Admitting: Family Medicine

## 2022-11-12 ENCOUNTER — Other Ambulatory Visit: Payer: Self-pay

## 2022-11-12 DIAGNOSIS — I10 Essential (primary) hypertension: Secondary | ICD-10-CM

## 2022-11-12 MED ORDER — ATENOLOL 50 MG PO TABS: 50.0000 mg | ORAL_TABLET | Freq: Every day | ORAL | 1 refills | Status: DC

## 2022-11-13 NOTE — Telephone Encounter (Signed)
Requested Prescriptions  Refused Prescriptions Disp Refills   telmisartan (MICARDIS) 80 MG tablet [Pharmacy Med Name: Telmisartan 80 MG Oral Tablet] 90 tablet 3    Sig: TAKE 1 TABLET BY MOUTH IN THE  MORNING     Cardiovascular:  Angiotensin Receptor Blockers Failed - 11/12/2022 10:38 AM      Failed - Cr in normal range and within 180 days    Creat  Date Value Ref Range Status  04/12/2022 1.53 (H) 0.70 - 1.28 mg/dL Final   Creatinine, Urine  Date Value Ref Range Status  04/12/2022 245 20 - 320 mg/dL Final         Failed - K in normal range and within 180 days    Potassium  Date Value Ref Range Status  04/12/2022 4.4 3.5 - 5.3 mmol/L Final         Failed - Valid encounter within last 6 months    Recent Outpatient Visits           1 year ago Mucopurulent chronic bronchitis (HCC)   [facility] Medicine Pickard, Priscille Heidelberg, MD   2 years ago Rhinosinusitis   [facility] Tanya Nones, Priscille Heidelberg, MD   2 years ago Rhinosinusitis   [facility] Medicine Donita Brooks, MD   2 years ago Stage 3 chronic kidney disease, unspecified whether stage 3a or 3b CKD (HCC)   [facility] Medicine Donita Brooks, MD   2 years ago History of gout   [facility] Family Medicine Pickard, Priscille Heidelberg, MD       Future Appointments             In 2 months Stoioff, Verna Czech, MD [facility]rology [facility] - Patient is not pregnant      Passed - Last BP in normal range    BP Readings from Last 1 Encounters:  04/12/22 124/72

## 2022-12-20 ENCOUNTER — Other Ambulatory Visit: Payer: Self-pay | Admitting: Family Medicine

## 2022-12-20 DIAGNOSIS — I10 Essential (primary) hypertension: Secondary | ICD-10-CM

## 2022-12-20 NOTE — Telephone Encounter (Signed)
Requested Prescriptions  Pending Prescriptions Disp Refills   allopurinol (ZYLOPRIM) 300 MG tablet [Pharmacy Med Name: Allopurinol 300 MG Oral Tablet] 90 tablet 1    Sig: TAKE 1 TABLET BY MOUTH DAILY     Endocrinology:  Gout Agents - allopurinol Failed - 12/20/2022  5:44 AM      Failed - Uric Acid in normal range and within 360 days    Uric Acid, Serum  Date Value Ref Range Status  04/12/2022 3.7 (L) 4.0 - 8.0 mg/dL Final    Comment:    Therapeutic target for gout patients: <6.0 mg/dL .          Failed - Cr in normal range and within 360 days    Creat  Date Value Ref Range Status  04/12/2022 1.53 (H) 0.70 - 1.28 mg/dL Final   Creatinine, Urine  Date Value Ref Range Status  04/12/2022 245 20 - 320 mg/dL Final         Failed - Valid encounter within last 12 months    Recent Outpatient Visits           1 year ago Mucopurulent chronic bronchitis (HCC)   [facility] Pickard, Priscille Heidelberg, MD   2 years ago Rhinosinusitis   [facility] Donita Brooks, MD   2 years ago Rhinosinusitis   [facility] Donita Brooks, MD   2 years ago Stage 3 chronic kidney disease, unspecified whether stage 3a or 3b CKD (HCC)   [facility] Donita Brooks, MD   2 years ago History of gout   [facility] Pickard, Priscille Heidelberg, MD       Future Appointments             In 1 month Santiago, James Czech, MD [facility] [place]            Passed - CBC within normal limits and completed in the last 12 months    WBC  Date Value Ref Range Status  04/12/2022 4.4 3.8 - 10.8 Thousand/uL Final   RBC  Date Value Ref Range Status  04/12/2022 4.87 4.20 - 5.80 Million/uL Final   Hemoglobin  Date Value Ref Range Status  04/12/2022 14.0 13.2 - 17.1 g/dL Final   HCT  Date Value Ref Range Status  04/12/2022 43.2 38.5 - 50.0 % Final   MCHC  Date Value Ref Range Status  04/12/2022 32.4 32.0 - 36.0 g/dL Final    [facility]  Date Value Ref Range Status  04/12/2022 28.7 27.0 - 33.0 pg Final   MCV  Date Value Ref Range Status  04/12/2022 88.7 80.0 - 100.0 fL Final   No results found for: "PLTCOUNTKUC", "LABPLAT", "POCPLA" RDW  Date Value Ref Range Status  04/12/2022 14.2 11.0 - 15.0 % Final          simvastatin (ZOCOR) 10 MG tablet [Pharmacy Med Name: Simvastatin 10 MG Oral Tablet] 90 tablet 1    Sig: TAKE 1 TABLET BY MOUTH AT  BEDTIME     Cardiovascular:  Antilipid - Statins Failed - 12/20/2022  5:44 AM      Failed - Valid encounter within last 12 months    Recent Outpatient Visits           1 year ago Mucopurulent chronic bronchitis (HCC)   [facility] Pickard, Priscille Heidelberg, MD   2 years ago Rhinosinusitis   [facility] Pickard, Priscille Heidelberg, MD  2 years ago Rhinosinusitis   [facility] Donita Brooks, MD   2 years ago Stage 3 chronic kidney disease, unspecified whether stage 3a or 3b CKD (HCC)   [facility] Pickard, Priscille Heidelberg, MD   2 years ago History of gout   [facility] Pickard, Priscille Heidelberg, MD       Future Appointments             In 1 month Santiago, James Czech, MD [facility]rology [place]            Failed - Lipid Panel in normal range within the last 12 months    Cholesterol  Date Value Ref Range Status  04/12/2022 133 <200 mg/dL Final   LDL Cholesterol (Calc)  Date Value Ref Range Status  04/12/2022 75 mg/dL (calc) Final    Comment:    Reference range: <100 . Desirable range <100 mg/dL for primary prevention;   <70 mg/dL for patients with CHD or diabetic patients  with > or = 2 CHD risk factors. Marland Kitchen LDL-C is now calculated using the Martin-Hopkins  calculation, which is a validated novel method providing  better accuracy than the Friedewald equation in the  estimation of LDL-C.  Horald Pollen et al. Lenox Ahr. 8295;621(30): 2061-2068  (http://education.[employer].com/faq/FAQ164)     HDL  Date Value Ref Range Status  04/12/2022 39 (L) > OR = 40 mg/dL Final   Triglycerides  Date Value Ref Range Status  04/12/2022 107 <150 mg/dL Final         Passed - Patient is not pregnant       atenolol (TENORMIN) 50 MG tablet [Pharmacy Med Name: Atenolol 50 MG Oral Tablet] 90 tablet 1    Sig: TAKE 1 TABLET BY MOUTH DAILY     Cardiovascular: Beta Blockers 2 Failed - 12/20/2022  5:44 AM      Failed - Cr in normal range and within 360 days    Creat  Date Value Ref Range Status  04/12/2022 1.53 (H) 0.70 - 1.28 mg/dL Final   Creatinine, Urine  Date Value Ref Range Status  04/12/2022 245 20 - 320 mg/dL Final         Failed - Valid encounter within last 6 months    Recent Outpatient Visits           1 year ago Mucopurulent chronic bronchitis (HCC)   [facility] Pickard, Priscille Heidelberg, MD   2 years ago Rhinosinusitis   [facility] Tanya Nones, Priscille Heidelberg, MD   2 years ago Rhinosinusitis   [facility] Donita Brooks, MD   2 years ago Stage 3 chronic kidney disease, unspecified whether stage 3a or 3b CKD (HCC)   [facility] Pickard, Priscille Heidelberg, MD   2 years ago History of gout   [facility] Pickard, Priscille Heidelberg, MD       Future Appointments             In 1 month Santiago, James Czech, MD [facility]rology [place]            Passed - Last BP in normal range    BP Readings from Last 1 Encounters:  04/12/22 124/72         Passed - Last Heart Rate in normal range    Pulse Readings from Last 1 Encounters:  04/12/22 82          telmisartan (MICARDIS)  80 MG tablet [Pharmacy Med Name: Telmisartan 80 MG Oral Tablet] 90 tablet 1    Sig: TAKE 1 TABLET BY MOUTH IN THE  MORNING     Cardiovascular:  Angiotensin Receptor Blockers Failed - 12/20/2022  5:44 AM      Failed - Cr in normal range and within 180 days    Creat  Date Value Ref Range Status  04/12/2022 1.53 (H) 0.70 - 1.28 mg/dL  Final   Creatinine, Urine  Date Value Ref Range Status  04/12/2022 245 20 - 320 mg/dL Final         Failed - K in normal range and within 180 days    Potassium  Date Value Ref Range Status  04/12/2022 4.4 3.5 - 5.3 mmol/L Final         Failed - Valid encounter within last 6 months    Recent Outpatient Visits           1 year ago Mucopurulent chronic bronchitis (HCC)   [facility] Pickard, Priscille Heidelberg, MD   2 years ago Rhinosinusitis   [facility] Tanya Nones, Priscille Heidelberg, MD   2 years ago Rhinosinusitis   [facility] Donita Brooks, MD   2 years ago Stage 3 chronic kidney disease, unspecified whether stage 3a or 3b CKD (HCC)   [facility] Pickard, Priscille Heidelberg, MD   2 years ago History of gout   [facility] Pickard, Priscille Heidelberg, MD       Future Appointments             In 1 month Santiago, James Czech, MD [facility] - Patient is not pregnant      Passed - Last BP in normal range    BP Readings from Last 1 Encounters:  04/12/22 124/72          doxazosin (CARDURA) 4 MG tablet [Pharmacy Med Name: Doxazosin Mesylate 4 MG Oral Tablet] 90 tablet 1    Sig: TAKE 1 TABLET BY MOUTH DAILY     Cardiovascular:  Alpha Blockers Failed - 12/20/2022  5:44 AM      Failed - Valid encounter within last 6 months    Recent Outpatient Visits           1 year ago Mucopurulent chronic bronchitis (HCC)   [facility] Pickard, Priscille Heidelberg, MD   2 years ago Rhinosinusitis   [facility] Tanya Nones, Priscille Heidelberg, MD   2 years ago Rhinosinusitis   [facility] Donita Brooks, MD   2 years ago Stage 3 chronic kidney disease, unspecified whether stage 3a or 3b CKD (HCC)   [facility] Pickard, Priscille Heidelberg, MD   2 years ago History of gout   [facility] Pickard, Priscille Heidelberg, MD       Future Appointments              In 1 month Santiago, James Czech, MD [facility] [place]            Passed - Last BP in normal range    BP Readings from Last 1 Encounters:  04/12/22 124/72

## 2022-12-21 ENCOUNTER — Other Ambulatory Visit: Payer: Self-pay

## 2022-12-21 NOTE — Telephone Encounter (Signed)
Prescription Request  12/21/2022  LOV: 04/12/22  What is the name of the medication or equipment? dexlansoprazole (DEXILANT) 60 MG capsule [[id number]]  Have you contacted your pharmacy to request a refill? Yes   Which pharmacy would you like this sent to?  [facility]) [place], [place] - [address] [facility] [address] [place] [address] [place] [address] Phone: [phone] Fax: [phone]    Patient notified that their request is being sent to the clinical staff for review and that they should receive a response within 2 business days.   Please advise at [facility] [phone]   Prescription Request  12/21/2022  LOV: 04/12/22  What is the name of the medication or equipment? gabapentin (NEURONTIN) 300 MG capsule [[id number]]  Have you contacted your pharmacy to request a refill? Yes   Which pharmacy would you like this sent to?  [facility]) [place], [place] - [address] [facility] [address] [place] [address] [place] [address] Phone: [phone] Fax: [phone]    Patient notified that their request is being sent to the clinical staff for review and that they should receive a response within 2 business days.   Please advise at [facility] [phone]

## 2022-12-21 NOTE — Telephone Encounter (Signed)
Rx 02/02/22 #90 [id number] Requested Prescriptions  Pending Prescriptions Disp Refills   dexlansoprazole (DEXILANT) 60 MG capsule 90 capsule 3    Sig: Take 1 capsule (60 mg total) by mouth daily.     Gastroenterology: Proton Pump Inhibitors Failed - 12/21/2022  9:55 AM      Failed - Valid encounter within last 12 months    Recent Outpatient Visits           1 year ago Mucopurulent chronic bronchitis (HCC)   [facility] Pickard, Priscille Heidelberg, MD   2 years ago Rhinosinusitis   [facility] Tanya Nones, Priscille Heidelberg, MD   2 years ago Rhinosinusitis   [facility] Donita Brooks, MD   2 years ago Stage 3 chronic kidney disease, unspecified whether stage 3a or 3b CKD (HCC)   [facility] Donita Brooks, MD   2 years ago History of gout   [facility] Pickard, Priscille Heidelberg, MD       Future Appointments             In 1 month Stoioff, Verna Czech, MD [facility]

## 2022-12-25 ENCOUNTER — Other Ambulatory Visit: Payer: Self-pay

## 2022-12-25 ENCOUNTER — Encounter: Payer: Self-pay | Admitting: Family Medicine

## 2022-12-25 DIAGNOSIS — K219 Gastro-esophageal reflux disease without esophagitis: Secondary | ICD-10-CM

## 2022-12-25 DIAGNOSIS — G629 Polyneuropathy, unspecified: Secondary | ICD-10-CM

## 2022-12-25 DIAGNOSIS — I10 Essential (primary) hypertension: Secondary | ICD-10-CM

## 2022-12-25 DIAGNOSIS — I482 Chronic atrial fibrillation, unspecified: Secondary | ICD-10-CM

## 2022-12-25 MED ORDER — GABAPENTIN 300 MG PO CAPS: 300.0000 mg | ORAL_CAPSULE | Freq: Three times a day (TID) | ORAL | 1 refills | Status: DC | PRN

## 2022-12-25 MED ORDER — DEXLANSOPRAZOLE 60 MG PO CPDR: 60.0000 mg | DELAYED_RELEASE_CAPSULE | Freq: Every day | ORAL | 1 refills | Status: DC

## 2023-01-23 ENCOUNTER — Other Ambulatory Visit: Payer: Self-pay | Admitting: *Deleted

## 2023-01-23 DIAGNOSIS — N200 Calculus of kidney: Secondary | ICD-10-CM

## 2023-01-25 ENCOUNTER — Ambulatory Visit
Admission: RE | Admit: 2023-01-25 | Discharge: 2023-01-25 | Disposition: A | Discharge status: Home / Self Care | Payer: Medicare Other | Source: Ambulatory Visit | Attending: Urology | Admitting: Urology

## 2023-01-25 ENCOUNTER — Ambulatory Visit: Payer: Medicare Other | Admitting: Urology

## 2023-01-25 ENCOUNTER — Encounter: Payer: Self-pay | Admitting: Urology

## 2023-01-25 VITALS — BP 113/79 | HR 72 | Ht 74.0 in | Wt 238.0 lb

## 2023-01-25 DIAGNOSIS — N200 Calculus of kidney: Secondary | ICD-10-CM

## 2023-01-25 NOTE — Progress Notes (Signed)
Marcelle Overlie Plume,acting as a scribe for Riki Altes, MD.,have documented all relevant documentation on the behalf of Riki Altes, MD,as directed by  Riki Altes, MD while in the presence of Riki Altes, MD.  01/25/23 12:30 PM   James Santiago April 28, 1950 [id number]  Referring provider: Donita Brooks, MD [address] [place],  [place] [address]  Chief Complaint  Patient presents with   Nephrolithiasis    Urologic history:  1.  Recurrent nephrolithiasis Prior SWL and ureteroscopy in the remote past Ureteroscopic removal of a 10 mm right proximal ureteral calculus and multiple right renal calculi 07/19/2020 Stone analysis CaOxMono/CaOxDi 90/10 Previous metabolic evaluation performed but results unknown; declined repeat Multiple nonobstructing left renal calculi Left ureteroscopy laser lithotripsy of a 1 cm left mid ureteral calculus and multiple left renal calculi 12/19/2021; stent removed 12/29/2021 Stone analysis CaOxMono/CaOxDi 90/10   HPI: 72 y.o. male presents for 25-month follow-up.  No problems since last year's visit No bothersome LUTS Denies dysuria, gross hematuria Denies flank, abdominal or pelvic pain KUB today personally reviewed and interpreted.  No calcifications suspicious for urinary tract stones identified over the renal outlines or expected course of the ureters  PMH: Past Medical History:  Diagnosis Date   A-fib (HCC)    a. Dx 01/2012, CHADS2 = 1.-[place] in past.   Adenomatous colon polyp 06/2003   Arthritis    "fingers" (02/01/2012)   Atrial fibrillation (HCC)    Biliary dyskinesia    Cholelithiasis    "still got 2 small stones in there" (02/01/2012)   Dysrhythmia    Esophageal stricture    GERD (gastroesophageal reflux disease)    Gout    Hemorrhoids    Hiatal hernia    History of colon polyps    History of kidney stones    Hypercholesteremia    Hypertension    Kidney stone    Nephrolithiasis    "was told  kidney function at 65%"- hx. kidney stones    Surgical History: Past Surgical History:  Procedure Laterality Date   CARDIOVERSION  03/06/2012   Procedure: CARDIOVERSION;  Surgeon: Laurey Morale, MD;  [facility] ENDOSCOPY;  Service: Cardiovascular;  Laterality: N/A;   CHOLECYSTECTOMY N/A 11/13/2012   Procedure: LAPAROSCOPIC CHOLECYSTECTOMY;  Surgeon: Emelia Loron, MD;  Location: [facility] ORS;  Service: General;  Laterality: N/A;   COLONOSCOPY     CYSTOSCOPY W/ URETEROSCOPY W/ LITHOTRIPSY  1980's   CYSTOSCOPY WITH RETROGRADE PYELOGRAM, URETEROSCOPY AND STENT PLACEMENT  1980's   CYSTOSCOPY/RETROGRADE/URETEROSCOPY/STONE EXTRACTION WITH BASKET Left 12/19/2021   Procedure: CYSTOSCOPY/RETROGRADE/URETEROSCOPY/STONE EXTRACTION WITH BASKET;  Surgeon: Riki Altes, MD;  Location: [facility] ORS;  Service: Urology;  Laterality: Left;   CYSTOSCOPY/URETEROSCOPY/HOLMIUM LASER/STENT PLACEMENT Right 07/06/2020   Procedure: CYSTOSCOPY/URETEROSCOPY/STENT PLACEMENT;  Surgeon: Riki Altes, MD;  Location: [facility] ORS;  Service: Urology;  Laterality: Right;   CYSTOSCOPY/URETEROSCOPY/HOLMIUM LASER/STENT PLACEMENT Right 07/19/2020   Procedure: CYSTOSCOPY/URETEROSCOPY/HOLMIUM LASER/STENT EXCHANGE;  Surgeon: Riki Altes, MD;  Location: [facility] ORS;  Service: Urology;  Laterality: Right;   CYSTOSCOPY/URETEROSCOPY/HOLMIUM LASER/STENT PLACEMENT Left 12/19/2021   Procedure: CYSTOSCOPY/URETEROSCOPY/HOLMIUM LASER/STENT PLACEMENT;  Surgeon: Riki Altes, MD;  Location: [facility] ORS;  Service: Urology;  Laterality: Left;   ESOPHAGEAL DILATION     INGUINAL HERNIA REPAIR  ? 2008   "right" (02/01/2012)   POLYPECTOMY     VASECTOMY      Home Medications:  Allergies as of 01/25/2023       Reactions   Amoxicillin Diarrhea  Augmentin [amoxicillin-pot Clavulanate]    Diarrhea and sick        Medication List        Accurate as of January 25, 2023 12:30 PM. If you have any questions, ask your nurse or doctor.           allopurinol 300 MG tablet Commonly known as: ZYLOPRIM TAKE 1 TABLET BY MOUTH DAILY   amLODipine 2.5 MG tablet Commonly known as: NORVASC Take 1 tablet (2.5 mg total) by mouth daily.   atenolol 50 MG tablet Commonly known as: TENORMIN TAKE 1 TABLET BY MOUTH DAILY   calcium carbonate 750 MG chewable tablet Commonly known as: TUMS EX Chew 1 tablet by mouth daily as needed for indigestion or heartburn.   cefdinir 300 MG capsule Commonly known as: OMNICEF Take 1 capsule (300 mg total) by mouth 2 (two) times daily.   dexlansoprazole 60 MG capsule Commonly known as: DEXILANT Take 1 capsule (60 mg total) by mouth daily.   doxazosin 4 MG tablet Commonly known as: CARDURA TAKE 1 TABLET BY MOUTH DAILY   famotidine 20 MG tablet Commonly known as: PEPCID Take 20 mg by mouth daily as needed for heartburn or indigestion.   gabapentin 300 MG capsule Commonly known as: NEURONTIN Take 1 capsule (300 mg total) by mouth 3 (three) times daily as needed (neuropathy).   lidocaine-prilocaine cream Commonly known as: EMLA Apply 1 Application topically as needed.   pantoprazole 40 MG tablet Commonly known as: PROTONIX Take 1 tablet (40 mg total) by mouth daily.   Pradaxa 150 MG Pack Generic drug: Dabigatran Etexilate Mesylate Take 150 mg by mouth in the morning and at bedtime.   simvastatin 10 MG tablet Commonly known as: ZOCOR TAKE 1 TABLET BY MOUTH AT  BEDTIME   telmisartan 80 MG tablet Commonly known as: MICARDIS TAKE 1 TABLET BY MOUTH IN THE  MORNING        Allergies:  Allergies  Allergen Reactions   Amoxicillin Diarrhea   Augmentin [Amoxicillin-Pot Clavulanate]     Diarrhea and sick    Family History: Family History  Problem Relation Age of Onset   Lung cancer Father        died @ 44.  Also had PPM   Heart disease Father    Kidney disease Mother        died @ 44. Also had h/o CVA, breat cancer, diabetes, and PPM   Breast cancer Mother    Diabetes  Mother    Heart disease Mother    Cancer Brother        esophagus   Esophageal cancer Brother    Colon cancer Neg Hx    Rectal cancer Neg Hx    Stomach cancer Neg Hx     Social History:  reports that he has quit smoking. His smoking use included cigarettes. He has a 6 pack-year smoking history. His smokeless tobacco use includes chew. He reports that he does not currently use alcohol. He reports that he does not use drugs.   Physical Exam: BP 113/79   Pulse 72   Ht 6\' 2"  (1.88 m)   Wt 238 lb (108 kg)   BMI 30.56 kg/m   Constitutional:  Alert and oriented, No acute distress. HEENT: [place] AT, moist mucus membranes.  Trachea midline, no masses. Cardiovascular: No clubbing, cyanosis, or edema. Respiratory: Normal respiratory effort, no increased work of breathing. Psychiatric: Normal mood and affect.   Pertinent Imaging: KUB performed earlier today and was personally reviewed  and interpreted. It appears that there are punctate calcifications identified overlying the renal outlines bilaterally. The films have not been interpreted by radiology.      Assessment & Plan:    1.  Recurrent nephrolithiasis Small, bilateral punctate calculi seen on today's KUB We will move to every other year follow up with KUB and he is expected to call earlier for renal colic or recurrent stone symptoms   [facility] [address] [place], [place] [address] [phone]

## 2023-02-07 ENCOUNTER — Other Ambulatory Visit: Payer: Self-pay | Admitting: Family Medicine

## 2023-02-07 DIAGNOSIS — G629 Polyneuropathy, unspecified: Secondary | ICD-10-CM

## 2023-02-25 ENCOUNTER — Other Ambulatory Visit: Payer: Self-pay | Admitting: Family Medicine

## 2023-02-25 DIAGNOSIS — I482 Chronic atrial fibrillation, unspecified: Secondary | ICD-10-CM

## 2023-02-26 NOTE — Telephone Encounter (Signed)
 Requested Prescriptions  Pending Prescriptions Disp Refills   dabigatran  (PRADAXA ) 150 MG CAPS capsule [Pharmacy Med Name: DABIGATRAN   150MG   CAP] 180 capsule 0    Sig: TAKE 1 CAPSULE BY MOUTH IN THE  MORNING AND AT BEDTIME     Hematology:  Anticoagulants 2 Failed - 02/26/2023  1:22 PM      Failed - Cr in normal range and within 360 days    Creat  Date Value Ref Range Status  04/12/2022 1.53 (H) 0.70 - 1.28 mg/dL Final   Creatinine, Urine  Date Value Ref Range Status  04/12/2022 245 20 - 320 mg/dL Final         Failed - Valid encounter within last 12 months    Recent Outpatient Visits           1 year ago Mucopurulent chronic bronchitis (HCC)   [facility] Medicine Duanne Butler DASEN, MD   2 years ago Rhinosinusitis   [facility] Duanne Butler DASEN, MD   2 years ago Rhinosinusitis   [facility] Medicine Duanne Butler DASEN, MD   2 years ago Stage 3 chronic kidney disease, unspecified whether stage 3a or 3b CKD (HCC)   [facility] Duanne Butler DASEN, MD   2 years ago History of gout   [facility] Pickard, Butler DASEN, MD              Passed - PLT in normal range and within 360 days    Platelets  Date Value Ref Range Status  04/12/2022 227 140 - 400 Thousand/uL Final         Passed - HGB in normal range and within 360 days    Hemoglobin  Date Value Ref Range Status  04/12/2022 14.0 13.2 - 17.1 g/dL Final         Passed - HCT in normal range and within 360 days    HCT  Date Value Ref Range Status  04/12/2022 43.2 38.5 - 50.0 % Final         Passed - AST in normal range and within 360 days    AST  Date Value Ref Range Status  04/12/2022 13 10 - 35 U/L Final         Passed - ALT in normal range and within 360 days    ALT  Date Value Ref Range Status  04/12/2022 9 9 - 46 U/L Final         Passed - eGFR is 10 or above and within 360 days    GFR, Est African American  Date Value Ref Range Status   07/12/2020 55 (L) > OR = 60 mL/min/1.53m2 Final   GFR, Est Non African American  Date Value Ref Range Status  07/12/2020 48 (L) > OR = 60 mL/min/1.58m2 Final   eGFR  Date Value Ref Range Status  04/12/2022 49 (L) > OR = 60 mL/min/1.51m2 Final         Passed - Patient is not pregnant       Patient will need an office visit for further refills.

## 2023-02-28 ENCOUNTER — Encounter: Payer: Self-pay | Admitting: Family Medicine

## 2023-03-01 ENCOUNTER — Other Ambulatory Visit: Payer: Self-pay

## 2023-03-01 ENCOUNTER — Ambulatory Visit: Payer: Self-pay | Admitting: Family Medicine

## 2023-03-01 DIAGNOSIS — G629 Polyneuropathy, unspecified: Secondary | ICD-10-CM

## 2023-03-01 DIAGNOSIS — I10 Essential (primary) hypertension: Secondary | ICD-10-CM

## 2023-03-01 DIAGNOSIS — M1A9XX0 Chronic gout, unspecified, without tophus (tophi): Secondary | ICD-10-CM

## 2023-03-01 DIAGNOSIS — E7800 Pure hypercholesterolemia, unspecified: Secondary | ICD-10-CM

## 2023-03-01 DIAGNOSIS — I482 Chronic atrial fibrillation, unspecified: Secondary | ICD-10-CM

## 2023-03-01 DIAGNOSIS — K219 Gastro-esophageal reflux disease without esophagitis: Secondary | ICD-10-CM

## 2023-03-01 MED ORDER — ALLOPURINOL 300 MG PO TABS: 300.0000 mg | ORAL_TABLET | Freq: Every day | ORAL | 1 refills | Status: DC

## 2023-03-01 MED ORDER — DOXAZOSIN MESYLATE 4 MG PO TABS: 4.0000 mg | ORAL_TABLET | Freq: Every day | ORAL | 1 refills | Status: DC

## 2023-03-01 MED ORDER — GABAPENTIN 300 MG PO CAPS: 300.0000 mg | ORAL_CAPSULE | Freq: Three times a day (TID) | ORAL | 5 refills | Status: DC

## 2023-03-01 MED ORDER — TELMISARTAN 80 MG PO TABS: 80.0000 mg | ORAL_TABLET | Freq: Every morning | ORAL | 1 refills | Status: DC

## 2023-03-01 MED ORDER — SIMVASTATIN 10 MG PO TABS: 10.0000 mg | ORAL_TABLET | Freq: Every day | ORAL | 1 refills | Status: DC

## 2023-03-01 MED ORDER — APIXABAN 5 MG PO TABS: 5.0000 mg | ORAL_TABLET | Freq: Two times a day (BID) | ORAL | 1 refills | Status: DC

## 2023-03-01 MED ORDER — TELMISARTAN 80 MG PO TABS
80.0000 mg | ORAL_TABLET | Freq: Every morning | ORAL | 1 refills | Status: DC
Start: 2023-03-01 — End: 2023-08-15

## 2023-03-01 MED ORDER — GABAPENTIN 300 MG PO CAPS: 300.0000 mg | ORAL_CAPSULE | Freq: Three times a day (TID) | ORAL | 5 refills | Status: AC

## 2023-03-01 MED ORDER — OMEPRAZOLE 20 MG PO CPDR: 20.0000 mg | DELAYED_RELEASE_CAPSULE | Freq: Every day | ORAL | 1 refills | Status: DC

## 2023-03-01 MED ORDER — AMLODIPINE BESYLATE 2.5 MG PO TABS: 2.5000 mg | ORAL_TABLET | Freq: Every day | ORAL | 1 refills | Status: DC

## 2023-03-01 NOTE — Telephone Encounter (Signed)
eason for CRM: Prescription needs to be changed from [facility] to   For Daily prescription:  [facility]  [email or web address] 24/7 - [phone]   Local Pharmacy: [facility]  [address] [place],  [place], [place] [address] [phone]  Pt is requesting these changes take affect so Insurance can cover cost of medication.

## 2023-03-01 NOTE — Telephone Encounter (Unsigned)
Copied from CRM [id number]. Topic: Clinical - Prescription Issue >> Mar 01, 2023  1:21 PM Ivette P wrote: Reason for CRM: Prescription needs to be changed from [facility] to   For Daily prescription:  [facility]  [email or web address] 24/7 - [phone]   Local Pharmacy: [facility]  [address] [place],  [place], [place] [address] [phone]  Pt is requesting these changes take affect so Insurance can cover cost of medication.

## 2023-03-04 ENCOUNTER — Other Ambulatory Visit: Payer: Self-pay

## 2023-03-04 DIAGNOSIS — I10 Essential (primary) hypertension: Secondary | ICD-10-CM

## 2023-03-04 DIAGNOSIS — K219 Gastro-esophageal reflux disease without esophagitis: Secondary | ICD-10-CM

## 2023-03-04 MED ORDER — ATENOLOL 50 MG PO TABS: 50.0000 mg | ORAL_TABLET | Freq: Every day | ORAL | 3 refills | Status: DC

## 2023-03-04 MED ORDER — OMEPRAZOLE 20 MG PO CPDR: 20.0000 mg | DELAYED_RELEASE_CAPSULE | Freq: Every day | ORAL | 3 refills | Status: DC

## 2023-03-12 ENCOUNTER — Other Ambulatory Visit: Payer: Self-pay

## 2023-03-12 DIAGNOSIS — K219 Gastro-esophageal reflux disease without esophagitis: Secondary | ICD-10-CM

## 2023-03-12 MED ORDER — OMEPRAZOLE 20 MG PO CPDR: 20.0000 mg | DELAYED_RELEASE_CAPSULE | Freq: Two times a day (BID) | ORAL | 2 refills | Status: DC

## 2023-04-25 ENCOUNTER — Telehealth: Admitting: Physician Assistant

## 2023-04-25 DIAGNOSIS — B9689 Other specified bacterial agents as the cause of diseases classified elsewhere: Secondary | ICD-10-CM | POA: Diagnosis not present

## 2023-04-25 DIAGNOSIS — J019 Acute sinusitis, unspecified: Secondary | ICD-10-CM | POA: Diagnosis not present

## 2023-04-25 MED ORDER — DOXYCYCLINE HYCLATE 100 MG PO TABS: 100.0000 mg | ORAL_TABLET | Freq: Two times a day (BID) | ORAL | 0 refills | Status: DC

## 2023-04-25 NOTE — Progress Notes (Signed)

## 2023-04-25 NOTE — Progress Notes (Signed)
 I have spent 5 minutes in review of e-visit questionnaire, review and updating patient chart, medical decision making and response to patient.   Piedad Climes, PA-C

## 2023-05-13 ENCOUNTER — Ambulatory Visit (INDEPENDENT_AMBULATORY_CARE_PROVIDER_SITE_OTHER): Admitting: Family Medicine

## 2023-05-13 ENCOUNTER — Encounter: Payer: Self-pay | Admitting: Family Medicine

## 2023-05-13 VITALS — BP 132/70 | HR 85 | Temp 98.0°F | Ht 74.0 in | Wt 241.0 lb

## 2023-05-13 DIAGNOSIS — R058 Other specified cough: Secondary | ICD-10-CM | POA: Diagnosis not present

## 2023-05-13 MED ORDER — HYDROCOD POLI-CHLORPHE POLI ER 10-8 MG/5ML PO SUER: 5.0000 mL | Freq: Two times a day (BID) | ORAL | 0 refills | Status: AC | PRN

## 2023-05-13 MED ORDER — PREDNISONE 20 MG PO TABS: ORAL_TABLET | ORAL | 0 refills | Status: DC

## 2023-05-13 NOTE — Progress Notes (Signed)
 Subjective:    Patient ID: James Santiago, male    DOB: Jun 01, 1950, 73 y.o.   MRN: [id number] Patient states he has been coughing for 6 weeks.  It started this an upper respiratory infection however it has lingered now for almost a month and a half.  The cough is primarily dry.  He reports a tickle like sensation in his throat.  He is also having more acid reflux despite taking omeprazole twice daily and famotidine occasionally.  He denies any severe pain in his sinuses.  He denies any allergies.  He denies any wheezing.  He denies any chest pain or pleurisy or hemoptysis.  He denies any fever or chills. Past Medical History:  Diagnosis Date   A-fib [facility])    a. Dx 01/2012, CHADS2 = 1.-[facility] in past.   Adenomatous colon polyp 06/2003   Arthritis    "fingers" (02/01/2012)   Atrial fibrillation (HCC)    Biliary dyskinesia    Cholelithiasis    "still got 2 small stones in there" (02/01/2012)   Dysrhythmia    Esophageal stricture    GERD (gastroesophageal reflux disease)    Gout    Hemorrhoids    Hiatal hernia    History of colon polyps    History of kidney stones    Hypercholesteremia    Hypertension    Kidney stone    Nephrolithiasis    "was told kidney function at 65%"- hx. kidney stones    Current Outpatient Medications on File Prior to Visit  Medication Sig Dispense Refill   allopurinol (ZYLOPRIM) 300 MG tablet Take 1 tablet (300 mg total) by mouth daily. 90 tablet 1   amLODipine (NORVASC) 2.5 MG tablet Take 1 tablet (2.5 mg total) by mouth daily. 90 tablet 1   apixaban (ELIQUIS) 5 MG TABS tablet Take 1 tablet (5 mg total) by mouth 2 (two) times daily. 180 tablet 1   atenolol (TENORMIN) 50 MG tablet Take 1 tablet (50 mg total) by mouth daily. 90 tablet 3   calcium carbonate (TUMS EX) 750 MG chewable tablet Chew 1 tablet by mouth daily as needed for indigestion or heartburn.     doxazosin (CARDURA) 4 MG tablet Take 1 tablet (4 mg total) by mouth daily. 90 tablet 1    famotidine (PEPCID) 20 MG tablet Take 20 mg by mouth daily as needed for heartburn or indigestion.     gabapentin (NEURONTIN) 300 MG capsule Take 1 capsule (300 mg total) by mouth 3 (three) times daily. As needed. 180 capsule 5   omeprazole (PRILOSEC) 20 MG capsule Take 1 capsule (20 mg total) by mouth 2 (two) times daily. 180 capsule 2   simvastatin (ZOCOR) 10 MG tablet Take 1 tablet (10 mg total) by mouth at bedtime. 90 tablet 1   telmisartan (MICARDIS) 80 MG tablet Take 1 tablet (80 mg total) by mouth every morning. 90 tablet 1   No current facility-administered medications on file prior to visit.   Allergies  Allergen Reactions   Amoxicillin Diarrhea   Augmentin [Amoxicillin-Pot Clavulanate]     Diarrhea and sick   Social History   Socioeconomic History   Marital status: Married    Spouse name: [name]   Number of children: 1   Years of education: Not on file   Highest education level: Not on file  Occupational History    Employer: UNEMPLOYED  Tobacco Use   Smoking status: Former    Current packs/day: 0.50    Average packs/day: 0.5 packs/day for  12.0 years (6.0 ttl pk-yrs)    Types: Cigarettes   Smokeless tobacco: Current    Types: Chew   Tobacco comments:    02/01/2012 smoked cigarettes "probably 30 yr ago."  Currently uses 1/2 pouch of chewing tobacco daily; offered cessation materials; pt declines.  Vaping Use   Vaping status: Never Used  Substance and Sexual Activity   Alcohol use: Not Currently    Alcohol/week: 0.0 standard drinks of alcohol    Comment: occ. beer   Drug use: No   Sexual activity: Not Currently  Other Topics Concern   Not on file  Social History Narrative   Lives in [place] with wife.  They have one grown daughter, 2 grandsons and 1 granddaughter.  He works for the city of [employer], doing maintenance in Estate manager/land agent.   Social Drivers of Corporate investment banker Strain: Low Risk  (06/01/2021)   Overall Financial Resource Strain (CARDIA)     Difficulty of Paying Living Expenses: Not hard at all  Food Insecurity: No Food Insecurity (06/01/2021)   Hunger Vital Sign    Worried About Running Out of Food in the Last Year: Never true    Ran Out of Food in the Last Year: Never true  Transportation Needs: No Transportation Needs (06/01/2021)   PRAPARE - Administrator, Civil Service (Medical): No    Lack of Transportation (Non-Medical): No  Physical Activity: Sufficiently Active (06/01/2021)   Exercise Vital Sign    Days of Exercise per Week: 5 days    Minutes of Exercise per Session: 30 min  Stress: No Stress Concern Present (06/01/2021)   [employer] of Occupational Health - Occupational Stress Questionnaire    Feeling of Stress : Not at all  Social Connections: Moderately Isolated (06/01/2021)   Social Connection and Isolation Panel [NHANES]    Frequency of Communication with Friends and Family: More than three times a week    Frequency of Social Gatherings with Friends and Family: More than three times a week    Attends Religious Services: Never    Database administrator or Organizations: No    Attends Banker Meetings: Never    Marital Status: Married  Catering manager Violence: Not At Risk (06/01/2021)   Humiliation, Afraid, Rape, and Kick questionnaire    Fear of Current or Ex-Partner: No    Emotionally Abused: No    Physically Abused: No    Sexually Abused: No   Family History  Problem Relation Age of Onset   Lung cancer Father        died @ 42.  Also had PPM   Heart disease Father    Kidney disease Mother        died @ 42. Also had h/o CVA, breat cancer, diabetes, and PPM   Breast cancer Mother    Diabetes Mother    Heart disease Mother    Cancer Brother        esophagus   Esophageal cancer Brother    Colon cancer Neg Hx    Rectal cancer Neg Hx    Stomach cancer Neg Hx       Review of Systems     Objective:   Physical Exam Vitals reviewed.  Constitutional:       Appearance: Normal appearance. He is normal weight.  HENT:     Right Ear: Tympanic membrane and ear canal normal.     Left Ear: Tympanic membrane and ear canal normal.  Nose: No congestion or rhinorrhea.     Right Turbinates: Not swollen.     Left Turbinates: Not swollen.     Right Sinus: No maxillary sinus tenderness or frontal sinus tenderness.     Left Sinus: No maxillary sinus tenderness or frontal sinus tenderness.     Mouth/Throat:     Mouth: Mucous membranes are moist.     Pharynx: Oropharynx is clear. No oropharyngeal exudate or posterior oropharyngeal erythema.  Eyes:     Extraocular Movements: Extraocular movements intact.     Conjunctiva/sclera: Conjunctivae normal.     Pupils: Pupils are equal, round, and reactive to light.  Cardiovascular:     Rate and Rhythm: Normal rate.     Heart sounds: Normal heart sounds. No murmur heard.    No friction rub. No gallop.  Pulmonary:     Effort: Pulmonary effort is normal. No respiratory distress.     Breath sounds: No decreased air movement. No decreased breath sounds, wheezing, rhonchi or rales.  Neurological:     Mental Status: He is alert.           Assessment & Plan:  Upper airway cough syndrome I believe the patient has upper airway cough syndrome.  Begin Tussionex 1 teaspoon every 12 hours to try to break the cycle of coughing and calm down nerve irritation of the throat.  Use a prednisone taper pack for any element of allergies or reactive airway disease.  I see no role for antibiotics at the present time.  He is already on a PPI twice daily.  Therefore I will ask him to elevate the head of his bed 2 to 3 inches to try to help suppress the acid reflux at night

## 2023-08-12 ENCOUNTER — Encounter: Payer: Self-pay | Admitting: Family Medicine

## 2023-08-13 ENCOUNTER — Other Ambulatory Visit: Payer: Self-pay | Admitting: Family Medicine

## 2023-08-13 ENCOUNTER — Other Ambulatory Visit: Payer: Self-pay

## 2023-08-13 DIAGNOSIS — I482 Chronic atrial fibrillation, unspecified: Secondary | ICD-10-CM

## 2023-08-13 DIAGNOSIS — R140 Abdominal distension (gaseous): Secondary | ICD-10-CM

## 2023-08-15 ENCOUNTER — Other Ambulatory Visit: Payer: Self-pay | Admitting: Family Medicine

## 2023-08-15 DIAGNOSIS — I10 Essential (primary) hypertension: Secondary | ICD-10-CM

## 2023-08-15 DIAGNOSIS — I482 Chronic atrial fibrillation, unspecified: Secondary | ICD-10-CM

## 2023-08-15 DIAGNOSIS — M1A9XX0 Chronic gout, unspecified, without tophus (tophi): Secondary | ICD-10-CM

## 2023-08-15 DIAGNOSIS — E7800 Pure hypercholesterolemia, unspecified: Secondary | ICD-10-CM

## 2023-08-27 DIAGNOSIS — B0229 Other postherpetic nervous system involvement: Secondary | ICD-10-CM | POA: Diagnosis not present

## 2023-08-27 DIAGNOSIS — I781 Nevus, non-neoplastic: Secondary | ICD-10-CM | POA: Diagnosis not present

## 2024-01-24 ENCOUNTER — Other Ambulatory Visit: Payer: Self-pay | Admitting: Family Medicine

## 2024-01-24 DIAGNOSIS — K219 Gastro-esophageal reflux disease without esophagitis: Secondary | ICD-10-CM

## 2024-01-26 ENCOUNTER — Encounter: Payer: Self-pay | Admitting: Family Medicine

## 2024-01-27 ENCOUNTER — Other Ambulatory Visit: Payer: Self-pay

## 2024-01-27 DIAGNOSIS — K219 Gastro-esophageal reflux disease without esophagitis: Secondary | ICD-10-CM

## 2024-01-27 DIAGNOSIS — I10 Essential (primary) hypertension: Secondary | ICD-10-CM

## 2024-01-27 MED ORDER — ATENOLOL 50 MG PO TABS: 50.0000 mg | ORAL_TABLET | Freq: Every day | ORAL | 3 refills | Status: AC

## 2024-01-27 MED ORDER — OMEPRAZOLE 20 MG PO CPDR: 20.0000 mg | DELAYED_RELEASE_CAPSULE | Freq: Two times a day (BID) | ORAL | 2 refills | Status: AC

## 2024-02-25 ENCOUNTER — Encounter
Admission: RE | Disposition: A | Discharge status: Home / Self Care | Payer: Self-pay | Source: Home / Self Care | Attending: Internal Medicine

## 2024-02-25 ENCOUNTER — Encounter: Payer: Self-pay | Admitting: Internal Medicine

## 2024-02-25 ENCOUNTER — Ambulatory Visit
Admission: RE | Admit: 2024-02-25 | Discharge: 2024-02-25 | Disposition: A | Discharge status: Home / Self Care | Attending: Internal Medicine | Admitting: Internal Medicine

## 2024-02-25 ENCOUNTER — Other Ambulatory Visit: Payer: Self-pay

## 2024-02-25 ENCOUNTER — Ambulatory Visit: Payer: Self-pay

## 2024-02-25 DIAGNOSIS — Z87891 Personal history of nicotine dependence: Secondary | ICD-10-CM | POA: Insufficient documentation

## 2024-02-25 DIAGNOSIS — I4891 Unspecified atrial fibrillation: Secondary | ICD-10-CM | POA: Diagnosis not present

## 2024-02-25 DIAGNOSIS — I10 Essential (primary) hypertension: Secondary | ICD-10-CM | POA: Insufficient documentation

## 2024-02-25 DIAGNOSIS — Z7901 Long term (current) use of anticoagulants: Secondary | ICD-10-CM | POA: Insufficient documentation

## 2024-02-25 DIAGNOSIS — R1310 Dysphagia, unspecified: Secondary | ICD-10-CM | POA: Insufficient documentation

## 2024-02-25 DIAGNOSIS — K449 Diaphragmatic hernia without obstruction or gangrene: Secondary | ICD-10-CM | POA: Insufficient documentation

## 2024-02-25 DIAGNOSIS — K317 Polyp of stomach and duodenum: Secondary | ICD-10-CM | POA: Insufficient documentation

## 2024-02-25 DIAGNOSIS — K219 Gastro-esophageal reflux disease without esophagitis: Secondary | ICD-10-CM | POA: Insufficient documentation

## 2024-02-25 HISTORY — PX: ESOPHAGOGASTRODUODENOSCOPY: SHX5428

## 2024-02-25 HISTORY — PX: BIOPSY OF SKIN SUBCUTANEOUS TISSUE AND/OR MUCOUS MEMBRANE: SHX6741

## 2024-02-25 MED ORDER — DEXMEDETOMIDINE HCL IN NACL 80 MCG/20ML IV SOLN
INTRAVENOUS | Status: AC
  Filled 2024-02-25: qty 20

## 2024-02-25 MED ORDER — PROPOFOL 500 MG/50ML IV EMUL
INTRAVENOUS | Status: DC | PRN
  Administered 2024-02-25: 75 ug/kg/min via INTRAVENOUS

## 2024-02-25 MED ORDER — ONDANSETRON HCL 4 MG/2ML IJ SOLN
INTRAMUSCULAR | Status: AC
  Filled 2024-02-25: qty 2

## 2024-02-25 MED ORDER — LIDOCAINE HCL (CARDIAC) PF 100 MG/5ML IV SOSY
PREFILLED_SYRINGE | INTRAVENOUS | Status: DC | PRN
  Administered 2024-02-25: 60 mg via INTRAVENOUS

## 2024-02-25 MED ORDER — SODIUM CHLORIDE 0.9 % IV SOLN: INTRAVENOUS | Status: DC

## 2024-02-25 MED ORDER — GLYCOPYRROLATE 0.2 MG/ML IJ SOLN
INTRAMUSCULAR | Status: DC | PRN
  Administered 2024-02-25: .2 mg via INTRAVENOUS

## 2024-02-25 MED ORDER — DEXMEDETOMIDINE HCL IN NACL 80 MCG/20ML IV SOLN
INTRAVENOUS | Status: DC | PRN
  Administered 2024-02-25: 12 ug via INTRAVENOUS
  Administered 2024-02-25: 8 ug via INTRAVENOUS

## 2024-02-25 MED ORDER — PROPOFOL 1000 MG/100ML IV EMUL
INTRAVENOUS | Status: AC
  Filled 2024-02-25: qty 100

## 2024-02-25 MED ORDER — PROPOFOL 10 MG/ML IV BOLUS
INTRAVENOUS | Status: DC | PRN
  Administered 2024-02-25 (×2): 50 mg via INTRAVENOUS

## 2024-02-25 MED ORDER — LIDOCAINE HCL (PF) 2 % IJ SOLN
INTRAMUSCULAR | Status: AC
  Filled 2024-02-25: qty 5

## 2024-02-25 NOTE — Anesthesia Preprocedure Evaluation (Signed)
 "                                  Anesthesia Evaluation  Patient identified by MRN, date of birth, ID band Patient awake    Reviewed: Allergy & Precautions, NPO status , Patient's Chart, lab work & pertinent test results  History of Anesthesia Complications Negative for: history of anesthetic complications  Airway Mallampati: III  TM Distance: >3 FB Neck ROM: full    Dental no notable dental hx.    Pulmonary neg pulmonary ROS, Patient abstained from smoking., former smoker   Pulmonary exam normal        Cardiovascular hypertension, On Medications + dysrhythmias Atrial Fibrillation      Neuro/Psych  Neuromuscular disease  negative psych ROS   GI/Hepatic Neg liver ROS, hiatal hernia,GERD  ,,  Endo/Other  negative endocrine ROS    Renal/GU Renal disease  negative genitourinary   Musculoskeletal   Abdominal   Peds  Hematology negative hematology ROS (+)   Anesthesia Other Findings Past Medical History: No date: A-fib [facility])     Comment:  a. Dx 01/2012, CHADS2 = 1.-[facility] in past. 06/2003: Adenomatous colon polyp No date: Arthritis     Comment:  fingers (02/01/2012) No date: Atrial fibrillation (HCC) No date: Biliary dyskinesia No date: Cholelithiasis     Comment:  still got 2 small stones in there (02/01/2012) No date: Dysrhythmia No date: Esophageal stricture No date: GERD (gastroesophageal reflux disease) No date: Gout No date: Hemorrhoids No date: Hiatal hernia No date: History of colon polyps No date: History of kidney stones No date: Hypercholesteremia No date: Hypertension No date: Kidney stone No date: Nephrolithiasis     Comment:  was told kidney function at 65%- hx. kidney stones  Past Surgical History: 03/06/2012: CARDIOVERSION     Comment:  Procedure: CARDIOVERSION;  Surgeon: Ezra GORMAN Shuck, MD;              Location: [facility] ENDOSCOPY;  Service: Cardiovascular;                Laterality: N/A; 11/13/2012: CHOLECYSTECTOMY;  N/A     Comment:  Procedure: LAPAROSCOPIC CHOLECYSTECTOMY;  Surgeon:               Donnice Bury, MD;  Location: [facility] ORS;  Service:               General;  Laterality: N/A; No date: COLONOSCOPY 1980's: CYSTOSCOPY W/ URETEROSCOPY W/ LITHOTRIPSY 1980's: CYSTOSCOPY WITH RETROGRADE PYELOGRAM, URETEROSCOPY AND STENT  PLACEMENT 12/19/2021: CYSTOSCOPY/RETROGRADE/URETEROSCOPY/STONE EXTRACTION WITH  BASKET; Left     Comment:  Procedure: CYSTOSCOPY/RETROGRADE/URETEROSCOPY/STONE               EXTRACTION WITH BASKET;  Surgeon: Twylla Glendia BROCKS, MD;                Location: [facility] ORS;  Service: Urology;  Laterality: Left; 07/06/2020: CYSTOSCOPY/URETEROSCOPY/HOLMIUM LASER/STENT PLACEMENT;  Right     Comment:  Procedure: CYSTOSCOPY/URETEROSCOPY/STENT PLACEMENT;                Surgeon: Twylla Glendia BROCKS, MD;  Location: [facility] ORS;                Service: Urology;  Laterality: Right; 07/19/2020: CYSTOSCOPY/URETEROSCOPY/HOLMIUM LASER/STENT PLACEMENT; Right     Comment:  Procedure: CYSTOSCOPY/URETEROSCOPY/HOLMIUM LASER/STENT               EXCHANGE;  Surgeon: Twylla Glendia BROCKS, MD;  Location: [facility]              ORS;  Service: Urology;  Laterality: Right; 12/19/2021: CYSTOSCOPY/URETEROSCOPY/HOLMIUM LASER/STENT PLACEMENT; Left     Comment:  Procedure: CYSTOSCOPY/URETEROSCOPY/HOLMIUM LASER/STENT               PLACEMENT;  Surgeon: Twylla Glendia BROCKS, MD;  Location:               [facility] ORS;  Service: Urology;  Laterality: Left; No date: ESOPHAGEAL DILATION ? 2008: INGUINAL HERNIA REPAIR     Comment:  right (02/01/2012) No date: POLYPECTOMY No date: VASECTOMY     Reproductive/Obstetrics negative OB ROS                              Anesthesia Physical Anesthesia Plan  ASA: 3  Anesthesia Plan: General   Post-op Pain Management: Minimal or no pain anticipated   Induction: Intravenous  PONV Risk Score and Plan: 1 and Propofol  infusion and TIVA  Airway Management Planned: Natural  Airway and Nasal Cannula  Additional Equipment:   Intra-op Plan:   Post-operative Plan:   Informed Consent: I have reviewed the patients History and Physical, chart, labs and discussed the procedure including the risks, benefits and alternatives for the proposed anesthesia with the patient or authorized representative who has indicated his/her understanding and acceptance.     Dental Advisory Given  Plan Discussed with: Anesthesiologist, CRNA and Surgeon  Anesthesia Plan Comments: (Patient consented for risks of anesthesia including but not limited to:  - adverse reactions to medications - risk of airway placement if required - damage to eyes, teeth, lips or other oral mucosa - nerve damage due to positioning  - sore throat or hoarseness - Damage to heart, brain, nerves, lungs, other parts of body or loss of life  Patient voiced understanding and assent.)        Anesthesia Quick Evaluation  "

## 2024-02-25 NOTE — Interval H&P Note (Signed)
 History and Physical Interval Note:  02/25/2024 10:42 AM  James Santiago  has presented today for surgery, with the diagnosis of Esophageal dysphagia (R13.19) History of esophageal stricture (Z87.19) Gastroesophageal reflux disease with hiatal hernia (K21.9,K44.9) Abdominal bloating (R14.0) Dyspepsia (R10.13).  The various methods of treatment have been discussed with the patient and family. After consideration of risks, benefits and other options for treatment, the patient has consented to  Procedures: EGD (ESOPHAGOGASTRODUODENOSCOPY) (N/A) as a surgical intervention.  The patient's history has been reviewed, patient examined, no change in status, stable for surgery.  I have reviewed the patient's chart and labs.  Questions were answered to the patient's satisfaction.     [place], Damarri Rampy

## 2024-02-25 NOTE — Anesthesia Postprocedure Evaluation (Signed)
"   Anesthesia Post Note  Patient: DELMA VILLALVA  Procedure(s) Performed: EGD (ESOPHAGOGASTRODUODENOSCOPY)  Patient location during evaluation: Endoscopy Anesthesia Type: General Level of consciousness: awake and alert Pain management: pain level controlled Vital Signs Assessment: post-procedure vital signs reviewed and stable Respiratory status: spontaneous breathing, nonlabored ventilation, respiratory function stable and patient connected to nasal cannula oxygen Cardiovascular status: blood pressure returned to baseline and stable Postop Assessment: no apparent nausea or vomiting Anesthetic complications: no   No notable events documented.   Last Vitals:  Vitals:   02/25/24 1122 02/25/24 1132  BP: 118/81 (!) 117/94  Pulse: (!) 57   Resp: 18   Temp:    SpO2: 95%     Last Pain:  Vitals:   02/25/24 1132  TempSrc:   PainSc: 0-No pain                 Lendia LITTIE Mae      "

## 2024-02-25 NOTE — Transfer of Care (Signed)
 Immediate Anesthesia Transfer of Care Note  Patient: James Santiago  Procedure(s) Performed: EGD (ESOPHAGOGASTRODUODENOSCOPY)  Patient Location: PACU  Anesthesia Type:General  Level of Consciousness: sedated  Airway & Oxygen Therapy: Patient Spontanous Breathing  Post-op Assessment: Report given to RN and Post -op Vital signs reviewed and stable  Post vital signs: Reviewed and stable  Last Vitals:  Vitals Value Taken Time  BP    Temp    Pulse 62 02/25/24 11:11  Resp 17 02/25/24 11:11  SpO2 95 % 02/25/24 11:11  Vitals shown include unfiled device data.  Last Pain:  Vitals:   02/25/24 0954  TempSrc: Temporal  PainSc: 0-No pain         Complications: No notable events documented.

## 2024-02-25 NOTE — H&P (Signed)
 Outpatient short stay form Pre-procedure 02/25/2024 10:40 AM Alishea Beaudin K. Aundria, M.D.  Primary Physician: Butler Burr, M.D.  Reason for visit:  Esophageal dysphagia  History of present illness:  Mr. Stonebraker presents to the [facility] GI clinic at the request of Dr. Butler Burr at [facility] for chief complaint of esophageal dysphagia and dyspepsia symptoms. He presents to the clinic with his wife this morning. He reports 50-months ago he was having significant GI distress with postprandial abdominal bloating, gas, indigestion, and intermittent lower abdominal pain. He called to get an appointment but was told he had to wait 55-months for first opening. He reports while waiting for his appointment some of his GI symptoms have improved. He is no longer experiencing the abdominal bloating, gas, and lower abdominal pain. He has reduced his sugary and salty snack intake and feels like this has been the cause for his improvements. He has noticed a 3-year history of esophageal dysphagia symptoms to solids > liquids localized to mid-sternum. He has had issues with meats, breads, and ice cold beverages where he feels like something is hanging at this level and won't pass down. On several occasions he has had to regurgitate the food bolus for symptomatic relief. He reports a previous EGD in 2014 by Dr. Aneita for esophageal dysphagia revealed a distal esophageal stricture which was dilated at that time and commented on large hiatal hernia. He feels like he needs to have another EGD with stretching. He has been on PPI for 20+ years. He was on Dexilant  for years, but recently learned insurance is not going to pay for it any longer. He has been placed on omeprazole  40 mg twice daily. GERD symptoms seem to be well-controlled at this time. He denies any complaints of odynophagia, early satiety, hoarseness, or epigastric abdominal pain. He denies any unintentional weight loss. No changes in his  bowel habits.    Current Medications[1]  Medications Prior to Admission  Medication Sig Dispense Refill Last Dose/Taking   allopurinol  (ZYLOPRIM ) 300 MG tablet TAKE 1 TABLET DAILY 90 tablet 3 02/24/2024   amLODipine  (NORVASC ) 2.5 MG tablet TAKE 1 TABLET DAILY 90 tablet 3 02/25/2024 Morning   atenolol  (TENORMIN ) 50 MG tablet Take 1 tablet (50 mg total) by mouth daily. 90 tablet 3 02/25/2024 Morning   doxazosin  (CARDURA ) 4 MG tablet TAKE 1 TABLET DAILY 90 tablet 3 02/24/2024   famotidine  (PEPCID ) 20 MG tablet Take 20 mg by mouth daily as needed for heartburn or indigestion.   Past Week   gabapentin  (NEURONTIN ) 300 MG capsule Take 1 capsule (300 mg total) by mouth 3 (three) times daily. As needed. 180 capsule 5 02/24/2024   omeprazole  (PRILOSEC) 20 MG capsule Take 1 capsule (20 mg total) by mouth 2 (two) times daily. 180 capsule 2 02/25/2024 at  6:30 AM   simvastatin  (ZOCOR ) 10 MG tablet TAKE 1 TABLET AT BEDTIME 90 tablet 3 02/24/2024   telmisartan  (MICARDIS ) 80 MG tablet TAKE 1 TABLET EVERY MORNING 90 tablet 3 02/25/2024 at  6:30 AM   calcium  carbonate (TUMS EX) 750 MG chewable tablet Chew 1 tablet by mouth daily as needed for indigestion or heartburn.      chlorpheniramine-HYDROcodone  (TUSSIONEX) 10-8 MG/5ML Take 5 mLs by mouth every 12 (twelve) hours as needed. 120 mL 0    ELIQUIS  5 MG TABS tablet TAKE 1 TABLET TWICE A DAY 180 tablet 3 02/21/2024   predniSONE  (DELTASONE ) 20 MG tablet 3 tabs poqday 1-2, 2 tabs poqday 3-4, 1  tab poqday 5-6 (Patient not taking: Reported on 02/25/2024) 12 tablet 0 Completed Course     Allergies[2]   Past Medical History:  Diagnosis Date   A-fib (HCC)    a. Dx 01/2012, CHADS2 = 1.-[facility] in past.   Adenomatous colon polyp 06/2003   Arthritis    fingers (02/01/2012)   Atrial fibrillation (HCC)    Biliary dyskinesia    Cholelithiasis    still got 2 small stones in there (02/01/2012)   Dysrhythmia    Esophageal stricture    GERD (gastroesophageal reflux  disease)    Gout    Hemorrhoids    Hiatal hernia    History of colon polyps    History of kidney stones    Hypercholesteremia    Hypertension    Kidney stone    Nephrolithiasis    was told kidney function at 65%- hx. kidney stones    Review of systems:  Otherwise negative.    Physical Exam  Gen: Alert, oriented. Appears stated age.  HEENT: [place]/AT. PERRLA. Lungs: CTA, no wheezes. CV: RR nl S1, S2. Abd: soft, benign, no masses. BS+ Ext: No edema. Pulses 2+    Planned procedures: Proceed with EGD. The patient understands the nature of the planned procedure, indications, risks, alternatives and potential complications including but not limited to bleeding, infection, perforation, damage to internal organs and possible oversedation/side effects from anesthesia. The patient agrees and gives consent to proceed.  Please refer to procedure notes for findings, recommendations and patient disposition/instructions.     Addy Mcmannis K. Aundria, M.D. Gastroenterology 02/25/2024  10:40 AM          [1]  Current Facility-Administered Medications:    0.9 %  sodium chloride  infusion, , Intravenous, Continuous, [place], Hollace Michelli K, MD, Last Rate: 20 mL/hr at 02/25/24 1005, Continued from Pre-op at 02/25/24 1005 [2]  Allergies Allergen Reactions   Amoxicillin  Diarrhea   Augmentin  [Amoxicillin -Pot Clavulanate]     Diarrhea and sick

## 2024-02-25 NOTE — Op Note (Signed)
 [facility] Gastroenterology Patient Name: James Santiago Procedure Date: 02/25/2024 10:38 AM MRN: [id number] Account #: [id number] Date of Birth: 1950-10-13 Admit Type: Outpatient Age: 74 Room: [facility] ENDO ROOM 1 Gender: Male Note Status: Finalized Instrument Name: Upper GI Scope [id number] Procedure:             Upper GI endoscopy Indications:           Dysphagia, Suspected esophageal reflux, Suspected                         stricture of the esophagus Providers:             Kip Kautzman K. Aundria MD, MD Referring MD:          Butler DASEN. Pickard (Referring MD) Medicines:             Propofol  per Anesthesia Complications:         No immediate complications. Estimated blood loss:                         Minimal. Procedure:             Pre-Anesthesia Assessment:                        - The risks and benefits of the procedure and the                         sedation options and risks were discussed with the                         patient. All questions were answered and informed                         consent was obtained.                        - Patient identification and proposed procedure were                         verified prior to the procedure by the nurse. The                         procedure was verified in the procedure room.                        - ASA Grade Assessment: III - A patient with severe                         systemic disease.                        - After reviewing the risks and benefits, the patient                         was deemed in satisfactory condition to undergo the                         procedure.                        After obtaining informed  consent, the endoscope was                         passed under direct vision. Throughout the procedure,                         the patient's blood pressure, pulse, and oxygen                         saturations were monitored continuously. The Endoscope                         was  introduced through the mouth, and advanced to the                         third part of duodenum. The upper GI endoscopy was                         accomplished without difficulty. The patient tolerated                         the procedure well. Findings:      The examined esophagus was normal.      No endoscopic abnormality was evident in the esophagus to explain the       patient's complaint of dysphagia.      A large hiatal hernia was present.      Multiple medium pedunculated and sessile polyps with no bleeding and no       stigmata of recent bleeding were found in the gastric body. Biopsies       were taken with a cold forceps for histology. Estimated blood loss was       minimal.      The exam of the stomach was otherwise normal.      The examined duodenum was normal. Impression:            - Normal esophagus.                        - No endoscopic esophageal abnormality to explain                         patient's dysphagia.                        - Large hiatal hernia.                        - Multiple gastric polyps. Biopsied.                        - Normal examined duodenum. Recommendation:        - Patient has a contact number available for                         emergencies. The signs and symptoms of potential                         delayed complications were discussed with the patient.  Return to normal activities tomorrow. Written                         discharge instructions were provided to the patient.                        - Resume previous diet.                        - Continue present medications.                        - Do an upper GI series at appointment to be scheduled.                        - Follow up with Jonette Primmer, PA-C in the GI office.                         [phone]                        - Telephone GI office to schedule appointment at                         appointment to be scheduled.                         - The findings and recommendations were discussed with                         the patient. Procedure Code(s):     --- Professional ---                        [phone], Esophagogastroduodenoscopy, flexible,                         transoral; with biopsy, single or multiple Diagnosis Code(s):     --- Professional ---                        K31.7, Polyp of stomach and duodenum                        K44.9, Diaphragmatic hernia without obstruction or                         gangrene                        R13.10, Dysphagia, unspecified CPT copyright 2022 American Medical Association. All rights reserved. The codes documented in this report are preliminary and upon coder review may  be revised to meet current compliance requirements. Ladell MARLA Boss MD, MD 02/25/2024 11:17:32 AM This report has been signed electronically. Number of Addenda: 0 Note Initiated On: 02/25/2024 10:38 AM Estimated Blood Loss:  Estimated blood loss was minimal.      [facility] [place]

## 2024-02-26 LAB — SURGICAL PATHOLOGY

## 2024-02-28 ENCOUNTER — Ambulatory Visit: Admitting: Family Medicine

## 2024-02-28 ENCOUNTER — Encounter: Payer: Self-pay | Admitting: Family Medicine

## 2024-02-28 VITALS — BP 130/78 | HR 75 | Temp 97.8°F | Ht 74.0 in | Wt 248.0 lb

## 2024-02-28 DIAGNOSIS — I482 Chronic atrial fibrillation, unspecified: Secondary | ICD-10-CM

## 2024-02-28 DIAGNOSIS — Z125 Encounter for screening for malignant neoplasm of prostate: Secondary | ICD-10-CM | POA: Diagnosis not present

## 2024-02-28 DIAGNOSIS — N183 Chronic kidney disease, stage 3 unspecified: Secondary | ICD-10-CM

## 2024-02-28 DIAGNOSIS — E7800 Pure hypercholesterolemia, unspecified: Secondary | ICD-10-CM

## 2024-02-28 DIAGNOSIS — K219 Gastro-esophageal reflux disease without esophagitis: Secondary | ICD-10-CM | POA: Diagnosis not present

## 2024-02-28 DIAGNOSIS — M1A9XX0 Chronic gout, unspecified, without tophus (tophi): Secondary | ICD-10-CM

## 2024-02-28 DIAGNOSIS — I10 Essential (primary) hypertension: Secondary | ICD-10-CM

## 2024-02-28 DIAGNOSIS — Z8739 Personal history of other diseases of the musculoskeletal system and connective tissue: Secondary | ICD-10-CM | POA: Diagnosis not present

## 2024-02-28 MED ORDER — DOXAZOSIN MESYLATE 4 MG PO TABS: 4.0000 mg | ORAL_TABLET | Freq: Every day | ORAL | 3 refills | Status: AC

## 2024-02-28 MED ORDER — TELMISARTAN 80 MG PO TABS: 80.0000 mg | ORAL_TABLET | Freq: Every morning | ORAL | 3 refills | Status: AC

## 2024-02-28 MED ORDER — ALLOPURINOL 300 MG PO TABS: 300.0000 mg | ORAL_TABLET | Freq: Every day | ORAL | 3 refills | Status: AC

## 2024-02-28 NOTE — Progress Notes (Signed)
 " Subjective:    Patient ID: James Santiago, male    DOB: Jan 15, 1951, 74 y.o.   MRN: [id number]  Patient is a 74 year old Caucasian gentleman with a history of atrial fibrillation, stage III chronic kidney disease, hypertension, and history of gout.  He is overdue for fasting lab work.  His blood pressure today is well-controlled at 130/78.  He denies any chest pain or shortness of breath or dyspnea on exertion.  Patient wants to discontinue his blood thinner Eliquis .  His CHA2Ds score is 2.  I explained to the patient that his risk of stroke is likely 3 to 5%.  I recommended continuing the Eliquis .  He declines a flu shot.  He declines a tetanus shot.  Otherwise he is asymptomatic  Past Medical History:  Diagnosis Date   A-fib (HCC)    a. Dx 01/2012, CHADS2 = 1.-[place] in past.   Adenomatous colon polyp 06/2003   Arthritis    fingers (02/01/2012)   Atrial fibrillation (HCC)    Biliary dyskinesia    Cholelithiasis    still got 2 small stones in there (02/01/2012)   Dysrhythmia    Esophageal stricture    GERD (gastroesophageal reflux disease)    Gout    Hemorrhoids    Hiatal hernia    History of colon polyps    History of kidney stones    Hypercholesteremia    Hypertension    Kidney stone    Nephrolithiasis    was told kidney function at 66%- hx. kidney stones    Current Outpatient Medications on File Prior to Visit  Medication Sig Dispense Refill   allopurinol  (ZYLOPRIM ) 300 MG tablet TAKE 1 TABLET DAILY 90 tablet 3   amLODipine  (NORVASC ) 2.5 MG tablet TAKE 1 TABLET DAILY 90 tablet 3   atenolol  (TENORMIN ) 50 MG tablet Take 1 tablet (50 mg total) by mouth daily. 90 tablet 3   calcium  carbonate (TUMS EX) 750 MG chewable tablet Chew 1 tablet by mouth daily as needed for indigestion or heartburn.     chlorpheniramine-HYDROcodone  (TUSSIONEX) 10-8 MG/5ML Take 5 mLs by mouth every 12 (twelve) hours as needed. 120 mL 0   doxazosin  (CARDURA ) 4 MG tablet TAKE 1 TABLET DAILY 90  tablet 3   ELIQUIS  5 MG TABS tablet TAKE 1 TABLET TWICE A DAY 180 tablet 3   famotidine  (PEPCID ) 20 MG tablet Take 20 mg by mouth daily as needed for heartburn or indigestion.     gabapentin  (NEURONTIN ) 300 MG capsule Take 1 capsule (300 mg total) by mouth 3 (three) times daily. As needed. 180 capsule 5   omeprazole  (PRILOSEC) 20 MG capsule Take 1 capsule (20 mg total) by mouth 2 (two) times daily. 180 capsule 2   predniSONE  (DELTASONE ) 20 MG tablet 3 tabs poqday 1-2, 2 tabs poqday 3-4, 1 tab poqday 5-6 (Patient not taking: Reported on 02/25/2024) 12 tablet 0   simvastatin  (ZOCOR ) 10 MG tablet TAKE 1 TABLET AT BEDTIME 90 tablet 3   telmisartan  (MICARDIS ) 80 MG tablet TAKE 1 TABLET EVERY MORNING 90 tablet 3   No current facility-administered medications on file prior to visit.   Allergies  Allergen Reactions   Amoxicillin  Diarrhea   Augmentin  [Amoxicillin -Pot Clavulanate]     Diarrhea and sick   Social History   Socioeconomic History   Marital status: Married    Spouse name: [name]   Number of children: 1   Years of education: Not on file   Highest education level: Not on file  Occupational History    Employer: UNEMPLOYED  Tobacco Use   Smoking status: Former    Current packs/day: 0.50    Average packs/day: 0.5 packs/day for 12.0 years (6.0 ttl pk-yrs)    Types: Cigarettes   Smokeless tobacco: Current    Types: Chew   Tobacco comments:    02/01/2012 smoked cigarettes probably 30 yr ago.  Currently uses 1/2 pouch of chewing tobacco daily; offered cessation materials; pt declines.  Vaping Use   Vaping status: Never Used  Substance and Sexual Activity   Alcohol use: Not Currently    Alcohol/week: 0.0 standard drinks of alcohol    Comment: occ. beer   Drug use: No   Sexual activity: Not Currently  Other Topics Concern   Not on file  Social History Narrative   Lives in [place] with wife.  They have one grown daughter, 2 grandsons and 1 granddaughter.  He works for the city of [employer],  doing maintenance in estate manager/land agent.   Social Drivers of Health   Tobacco Use: High Risk (02/25/2024)   Patient History    Smoking Tobacco Use: Former    Smokeless Tobacco Use: Current    Passive Exposure: Not on file  Financial Resource Strain: Low Risk  (01/20/2024)   Received from [facility]   Overall Financial Resource Strain (CARDIA)    Difficulty of Paying Living Expenses: Not hard at all  Food Insecurity: No Food Insecurity (01/20/2024)   Received from [facility]   Epic    Within the past 12 months, you worried that your food would run out before you got the money to buy more.: Never true    Within the past 12 months, the food you bought just didn't last and you didn't have money to get more.: Never true  Transportation Needs: No Transportation Needs (01/20/2024)   Received from [facility] - Transportation    In the past 12 months, has lack of transportation kept you from medical appointments or from getting medications?: No    Lack of Transportation (Non-Medical): No  Physical Activity: Sufficiently Active (06/01/2021)   Exercise Vital Sign    Days of Exercise per Week: 5 days    Minutes of Exercise per Session: 30 min  Stress: No Stress Concern Present (06/01/2021)   [employer] - Occupational Stress Questionnaire    Feeling of Stress : Not at all  Social Connections: Moderately Isolated (06/01/2021)   Social Connection and Isolation Panel    Frequency of Communication with Friends and Family: More than three times a week    Frequency of Social Gatherings with Friends and Family: More than three times a week    Attends Religious Services: Never    Database Administrator or Organizations: No    Attends Banker Meetings: Never    Marital Status: Married  Catering Manager Violence: Not At Risk (06/01/2021)   Humiliation, Afraid, Rape, and Kick questionnaire    Fear of  Current or Ex-Partner: No    Emotionally Abused: No    Physically Abused: No    Sexually Abused: No  Depression (PHQ2-9): Low Risk (04/12/2022)   Depression (PHQ2-9)    PHQ-2 Score: 0  Alcohol Screen: Low Risk (06/01/2021)   Alcohol Screen    Last Alcohol Screening Score (AUDIT): 0  Housing: Low Risk  (01/20/2024)   Received from [facility]   Epic    In the last  12 months, was there a time when you were not able to pay the mortgage or rent on time?: No    In the past 12 months, how many times have you moved where you were living?: 0    At any time in the past 12 months, were you homeless or living in a shelter (including now)?: No  Utilities: Not At Risk (01/20/2024)   Received from [facility]   Epic    In the past 12 months has the electric, gas, oil, or water company threatened to shut off services in your home?: No  Health Literacy: Not on file   Family History  Problem Relation Age of Onset   Lung cancer Father        died @ 66.  Also had PPM   Heart disease Father    Kidney disease Mother        died @ 35. Also had h/o CVA, breat cancer, diabetes, and PPM   Breast cancer Mother    Diabetes Mother    Heart disease Mother    Cancer Brother        esophagus   Esophageal cancer Brother    Colon cancer Neg Hx    Rectal cancer Neg Hx    Stomach cancer Neg Hx       Review of Systems     Objective:   Physical Exam Vitals reviewed.  Constitutional:      Appearance: Normal appearance. He is normal weight.  HENT:     Right Ear: Tympanic membrane and ear canal normal.     Left Ear: Tympanic membrane and ear canal normal.     Nose: No congestion or rhinorrhea.     Right Turbinates: Not swollen.     Left Turbinates: Not swollen.     Right Sinus: No maxillary sinus tenderness or frontal sinus tenderness.     Left Sinus: No maxillary sinus tenderness or frontal sinus tenderness.     Mouth/Throat:     Mouth: Mucous membranes are moist.      Pharynx: Oropharynx is clear. No oropharyngeal exudate or posterior oropharyngeal erythema.  Eyes:     Extraocular Movements: Extraocular movements intact.     Conjunctiva/sclera: Conjunctivae normal.     Pupils: Pupils are equal, round, and reactive to light.  Cardiovascular:     Rate and Rhythm: Normal rate. Rhythm irregular.     Heart sounds: Normal heart sounds. No murmur heard.    No friction rub. No gallop.  Pulmonary:     Effort: Pulmonary effort is normal. No respiratory distress.     Breath sounds: No decreased air movement. No decreased breath sounds, wheezing, rhonchi or rales.  Musculoskeletal:     Right lower leg: No edema.     Left lower leg: No edema.  Neurological:     Mental Status: He is alert.           Assessment & Plan:  Benign essential HTN - Plan: telmisartan  (MICARDIS ) 80 MG tablet, doxazosin  (CARDURA ) 4 MG tablet  Gastroesophageal reflux disease without esophagitis  Pure hypercholesterolemia - Plan: Comprehensive metabolic panel with GFR, Lipid panel  Atrial fibrillation, chronic (HCC) - Plan: telmisartan  (MICARDIS ) 80 MG tablet  Stage 3 chronic kidney disease, unspecified whether stage 3a or 3b CKD (HCC) - Plan: CBC with Differential/Platelet, Comprehensive metabolic panel with GFR, Lipid panel, Protein / Creatinine Ratio, Urine  History of gout - Plan: Uric acid  Prostate cancer screening - Plan: PSA  Chronic  gout without tophus, unspecified cause, unspecified site - Plan: allopurinol  (ZYLOPRIM ) 300 MG tablet Patient's blood pressure today is well-controlled.  I will make no changes in his antihypertensive medication.  I recommended the patient continue Eliquis  despite the cost due to his CHADS2 score of 2.  His heart rate is currently well-controlled despite being in atrial fibrillation patient has a history of stage III chronic kidney disease.  I would like to obtain a baseline creatinine and also check a urine to protein creatinine ratio.  If  significantly elevated, consider adding Farxiga to reduce his risk of progression of chronic kidney disease.  Check a uric acid level to ensure that allopurinol  is lowering his uric acid sufficiently.  I want his uric acid level to be less than 6.  Check a PSA to screen for prostate cancer.  Patient refused a tetanus shot as well as a flu shot "

## 2024-02-29 LAB — CBC WITH DIFFERENTIAL/PLATELET
Absolute Lymphocytes: 1518 {cells}/uL (ref 850–3900)
Absolute Monocytes: 488 {cells}/uL (ref 200–950)
Basophils Absolute: 40 {cells}/uL (ref 0–200)
Basophils Relative: 0.9 %
Eosinophils Absolute: 150 {cells}/uL (ref 15–500)
Eosinophils Relative: 3.4 %
HCT: 44.3 % (ref 39.4–51.1)
Hemoglobin: 13.8 g/dL (ref 13.2–17.1)
MCH: 27.9 pg (ref 27.0–33.0)
MCHC: 31.2 g/dL — ABNORMAL LOW (ref 31.6–35.4)
MCV: 89.7 fL (ref 81.4–101.7)
MPV: 12 fL (ref 7.5–12.5)
Monocytes Relative: 11.1 %
Neutro Abs: 2204 {cells}/uL (ref 1500–7800)
Neutrophils Relative %: 50.1 %
Platelets: 188 Thousand/uL (ref 140–400)
RBC: 4.94 Million/uL (ref 4.20–5.80)
RDW: 13.8 % (ref 11.0–15.0)
Total Lymphocyte: 34.5 %
WBC: 4.4 Thousand/uL (ref 3.8–10.8)

## 2024-02-29 LAB — COMPREHENSIVE METABOLIC PANEL WITH GFR
AG Ratio: 2 (calc) (ref 1.0–2.5)
ALT: 13 U/L (ref 9–46)
AST: 12 U/L (ref 10–35)
Albumin: 4.4 g/dL (ref 3.6–5.1)
Alkaline phosphatase (APISO): 143 U/L (ref 35–144)
BUN/Creatinine Ratio: 10 (calc) (ref 6–22)
BUN: 14 mg/dL (ref 7–25)
CO2: 27 mmol/L (ref 20–32)
Calcium: 9.1 mg/dL (ref 8.6–10.3)
Chloride: 106 mmol/L (ref 98–110)
Creat: 1.47 mg/dL — ABNORMAL HIGH (ref 0.70–1.28)
Globulin: 2.2 g/dL (ref 1.9–3.7)
Glucose, Bld: 102 mg/dL — ABNORMAL HIGH (ref 65–99)
Potassium: 4 mmol/L (ref 3.5–5.3)
Sodium: 140 mmol/L (ref 135–146)
Total Bilirubin: 1.7 mg/dL — ABNORMAL HIGH (ref 0.2–1.2)
Total Protein: 6.6 g/dL (ref 6.1–8.1)
eGFR: 50 mL/min/1.73m2 — ABNORMAL LOW

## 2024-02-29 LAB — PROTEIN / CREATININE RATIO, URINE
Creatinine, Urine: 227 mg/dL (ref 20–320)
Protein/Creat Ratio: 141 mg/g{creat} (ref 25–148)
Protein/Creatinine Ratio: 0.141 mg/mg{creat} (ref 0.025–0.148)
Total Protein, Urine: 32 mg/dL — ABNORMAL HIGH (ref 5–25)

## 2024-02-29 LAB — LIPID PANEL
Cholesterol: 133 mg/dL
HDL: 41 mg/dL
LDL Cholesterol (Calc): 74 mg/dL
Non-HDL Cholesterol (Calc): 92 mg/dL
Total CHOL/HDL Ratio: 3.2 (calc)
Triglycerides: 95 mg/dL

## 2024-02-29 LAB — URIC ACID: Uric Acid, Serum: 3.7 mg/dL — ABNORMAL LOW (ref 4.0–8.0)

## 2024-02-29 LAB — PSA: PSA: 0.48 ng/mL

## 2024-03-02 ENCOUNTER — Ambulatory Visit: Payer: Self-pay | Admitting: Family Medicine

## 2024-03-11 ENCOUNTER — Ambulatory Visit
